# Patient Record
Sex: Male | Born: 1935 | Race: White | Hispanic: No | Marital: Married | Smoking: Former smoker
Health system: Southern US, Community
[De-identification: ages and names within clinical notes are randomized; demographics above are authoritative.]

## PROBLEM LIST (undated history)

## (undated) DIAGNOSIS — E785 Hyperlipidemia, unspecified: Secondary | ICD-10-CM

## (undated) DIAGNOSIS — C801 Malignant (primary) neoplasm, unspecified: Secondary | ICD-10-CM

## (undated) DIAGNOSIS — I10 Essential (primary) hypertension: Secondary | ICD-10-CM

## (undated) DIAGNOSIS — I2510 Atherosclerotic heart disease of native coronary artery without angina pectoris: Secondary | ICD-10-CM

## (undated) DIAGNOSIS — E119 Type 2 diabetes mellitus without complications: Secondary | ICD-10-CM

## (undated) HISTORY — PX: CORONARY ARTERY BYPASS GRAFT: SHX141

## (undated) HISTORY — DX: Hyperlipidemia, unspecified: E78.5

## (undated) HISTORY — PX: BILIARY DRAINAGE: SHX1229

## (undated) HISTORY — PX: APPENDECTOMY: SHX54

## (undated) HISTORY — PX: HERNIA REPAIR: SHX51

## (undated) HISTORY — DX: Type 2 diabetes mellitus without complications: E11.9

---

## 2000-07-02 ENCOUNTER — Inpatient Hospital Stay (HOSPITAL_COMMUNITY)
Admission: AD | Admit: 2000-07-02 | Discharge: 2000-07-07 | Payer: Self-pay | Admitting: Thoracic Surgery (Cardiothoracic Vascular Surgery)

## 2000-07-02 ENCOUNTER — Encounter: Payer: Self-pay | Admitting: Thoracic Surgery (Cardiothoracic Vascular Surgery)

## 2000-07-03 ENCOUNTER — Encounter: Payer: Self-pay | Admitting: Thoracic Surgery (Cardiothoracic Vascular Surgery)

## 2000-07-04 ENCOUNTER — Encounter: Payer: Self-pay | Admitting: Thoracic Surgery (Cardiothoracic Vascular Surgery)

## 2000-07-05 ENCOUNTER — Encounter: Payer: Self-pay | Admitting: Cardiothoracic Surgery

## 2005-10-06 HISTORY — PX: WHIPPLE PROCEDURE: SHX2667

## 2005-11-23 ENCOUNTER — Emergency Department: Payer: Self-pay | Admitting: Emergency Medicine

## 2005-12-04 ENCOUNTER — Ambulatory Visit: Payer: Self-pay | Admitting: Unknown Physician Specialty

## 2005-12-05 ENCOUNTER — Inpatient Hospital Stay: Payer: Self-pay | Admitting: Gastroenterology

## 2005-12-07 ENCOUNTER — Other Ambulatory Visit: Payer: Self-pay

## 2007-02-22 ENCOUNTER — Ambulatory Visit: Payer: Self-pay | Admitting: Ophthalmology

## 2007-04-12 ENCOUNTER — Ambulatory Visit: Payer: Self-pay | Admitting: Ophthalmology

## 2008-03-21 ENCOUNTER — Ambulatory Visit: Payer: Self-pay | Admitting: Urology

## 2008-03-21 ENCOUNTER — Other Ambulatory Visit: Payer: Self-pay

## 2008-03-27 ENCOUNTER — Ambulatory Visit: Payer: Self-pay | Admitting: Urology

## 2009-01-23 ENCOUNTER — Ambulatory Visit: Payer: Self-pay

## 2013-10-31 ENCOUNTER — Ambulatory Visit: Payer: Self-pay | Admitting: Family Medicine

## 2014-01-16 ENCOUNTER — Inpatient Hospital Stay: Payer: Self-pay | Admitting: Family Medicine

## 2014-01-16 LAB — CBC WITH DIFFERENTIAL/PLATELET
Basophil #: 0 10*3/uL (ref 0.0–0.1)
Basophil %: 0.2 %
Eosinophil #: 0.2 10*3/uL (ref 0.0–0.7)
Eosinophil %: 1.9 %
HCT: 43.5 % (ref 40.0–52.0)
HGB: 14.1 g/dL (ref 13.0–18.0)
Lymphocyte #: 2.5 10*3/uL (ref 1.0–3.6)
Lymphocyte %: 21.2 %
MCH: 27.2 pg (ref 26.0–34.0)
MCHC: 32.3 g/dL (ref 32.0–36.0)
MCV: 84 fL (ref 80–100)
Monocyte #: 0.6 x10 3/mm (ref 0.2–1.0)
Monocyte %: 4.8 %
Neutrophil #: 8.4 10*3/uL — ABNORMAL HIGH (ref 1.4–6.5)
Neutrophil %: 71.9 %
Platelet: 255 10*3/uL (ref 150–440)
RBC: 5.16 10*6/uL (ref 4.40–5.90)
RDW: 14 % (ref 11.5–14.5)
WBC: 11.7 10*3/uL — ABNORMAL HIGH (ref 3.8–10.6)

## 2014-01-16 LAB — URINALYSIS, COMPLETE
Bacteria: NONE SEEN
Bilirubin,UR: NEGATIVE
Blood: NEGATIVE
Glucose,UR: NEGATIVE mg/dL (ref 0–75)
Ketone: NEGATIVE
Leukocyte Esterase: NEGATIVE
Nitrite: NEGATIVE
Ph: 5 (ref 4.5–8.0)
Protein: NEGATIVE
RBC,UR: <1 /HPF (ref 0–5)
Specific Gravity: 1.009 (ref 1.003–1.030)
Squamous Epithelial: NONE SEEN
WBC UR: <1 /HPF (ref 0–5)

## 2014-01-16 LAB — COMPREHENSIVE METABOLIC PANEL
Albumin: 4 g/dL (ref 3.4–5.0)
Alkaline Phosphatase: 88 U/L
Anion Gap: 7 (ref 7–16)
BUN: 17 mg/dL (ref 7–18)
Bilirubin,Total: 0.5 mg/dL (ref 0.2–1.0)
Calcium, Total: 9.1 mg/dL (ref 8.5–10.1)
Chloride: 102 mmol/L (ref 98–107)
Co2: 30 mmol/L (ref 21–32)
Creatinine: 1.04 mg/dL (ref 0.60–1.30)
EGFR (African American): >60
EGFR (Non-African Amer.): >60
Glucose: 205 mg/dL — ABNORMAL HIGH (ref 65–99)
Osmolality: 285 (ref 275–301)
Potassium: 4.1 mmol/L (ref 3.5–5.1)
SGOT(AST): 151 U/L — ABNORMAL HIGH (ref 15–37)
SGPT (ALT): 100 U/L — ABNORMAL HIGH (ref 12–78)
Sodium: 139 mmol/L (ref 136–145)
Total Protein: 7.4 g/dL (ref 6.4–8.2)

## 2014-01-16 LAB — LIPASE, BLOOD: Lipase: 1031 U/L — ABNORMAL HIGH (ref 73–393)

## 2014-01-16 LAB — TROPONIN I
Troponin-I: <0.02 ng/mL
Troponin-I: <0.02 ng/mL
Troponin-I: <0.02 ng/mL

## 2014-01-17 LAB — LIPID PANEL
Cholesterol: 66 mg/dL (ref 0–200)
HDL: 36 mg/dL — AB (ref 40–60)
LDL CHOLESTEROL, CALC: 20 mg/dL (ref 0–100)
Triglycerides: 50 mg/dL (ref 0–200)
VLDL Cholesterol, Calc: 10 mg/dL (ref 5–40)

## 2014-01-17 LAB — LIPASE, BLOOD: LIPASE: 31 U/L — AB (ref 73–393)

## 2014-01-17 LAB — CBC WITH DIFFERENTIAL/PLATELET
Basophil #: 0 10*3/uL (ref 0.0–0.1)
Basophil %: 0.1 %
EOS PCT: 1.3 %
Eosinophil #: 0.1 10*3/uL (ref 0.0–0.7)
HCT: 38 % — ABNORMAL LOW (ref 40.0–52.0)
HGB: 12.7 g/dL — ABNORMAL LOW (ref 13.0–18.0)
Lymphocyte #: 0.5 10*3/uL — ABNORMAL LOW (ref 1.0–3.6)
Lymphocyte %: 5.1 %
MCH: 28 pg (ref 26.0–34.0)
MCHC: 33.3 g/dL (ref 32.0–36.0)
MCV: 84 fL (ref 80–100)
Monocyte #: 0.5 x10 3/mm (ref 0.2–1.0)
Monocyte %: 6.1 %
NEUTROS ABS: 7.8 10*3/uL — AB (ref 1.4–6.5)
Neutrophil %: 87.4 %
PLATELETS: 123 10*3/uL — AB (ref 150–440)
RBC: 4.52 10*6/uL (ref 4.40–5.90)
RDW: 14.2 % (ref 11.5–14.5)
WBC: 8.9 10*3/uL (ref 3.8–10.6)

## 2014-01-17 LAB — BASIC METABOLIC PANEL
Anion Gap: 6 — ABNORMAL LOW (ref 7–16)
BUN: 16 mg/dL (ref 7–18)
CREATININE: 1.1 mg/dL (ref 0.60–1.30)
Calcium, Total: 8 mg/dL — ABNORMAL LOW (ref 8.5–10.1)
Chloride: 107 mmol/L (ref 98–107)
Co2: 27 mmol/L (ref 21–32)
EGFR (African American): >60
EGFR (Non-African Amer.): >60
Glucose: 125 mg/dL — ABNORMAL HIGH (ref 65–99)
OSMOLALITY: 282 (ref 275–301)
Potassium: 3.8 mmol/L (ref 3.5–5.1)
Sodium: 140 mmol/L (ref 136–145)

## 2014-01-17 LAB — MAGNESIUM: MAGNESIUM: 1.5 mg/dL — AB

## 2014-01-18 LAB — CANCER ANTIGEN 19-9: CA 19-9: 53 U/mL — ABNORMAL HIGH (ref 0–35)

## 2014-01-18 LAB — CEA: CEA: 1.7 ng/mL (ref 0.0–4.7)

## 2014-05-26 LAB — CBC AND DIFFERENTIAL
HEMATOCRIT: 41 % (ref 41–53)
HEMOGLOBIN: 14.1 g/dL (ref 13.5–17.5)
Neutrophils Absolute: 76 /uL
Platelets: 206 10*3/uL (ref 150–399)
WBC: 5.3 10*3/mL

## 2014-05-26 LAB — TSH: TSH: 0.82 u[IU]/mL (ref <=5.90)

## 2014-09-05 LAB — LIPID PANEL
CHOLESTEROL: 117 mg/dL (ref 0–200)
HDL: 44 mg/dL (ref 35–70)
LDL Cholesterol: 59 mg/dL
LDl/HDL Ratio: 1.3
TRIGLYCERIDES: 72 mg/dL (ref 40–160)

## 2014-09-05 LAB — BASIC METABOLIC PANEL
BUN: 14 mg/dL (ref 4–21)
CREATININE: 1 mg/dL (ref 0.6–1.3)
GLUCOSE: 108 mg/dL
POTASSIUM: 4.7 mmol/L (ref 3.4–5.3)
Sodium: 141 mmol/L (ref 137–147)

## 2014-11-01 LAB — HEMOGLOBIN A1C: Hgb A1c MFr Bld: 8.5 % — AB (ref 4.0–6.0)

## 2015-01-27 NOTE — Consult Note (Signed)
Brief Consult Note: Diagnosis: Pancreatitis.   Patient was seen by consultant.   Comments: Mr. Deakins is a very pleasant 81 y/o caucasian male with hx ampullary adenocarcinoma s/p Whipple in 2007 previously followed by Dr. Dossie Der at [facility].  He has done well since his surgery until last night at midnight when he developed severe upper abdominal pain.  Lipase elevated 1031, WBC elevated 11.7 consistent with acute pancreatitis despite no significant changes of pancreas on CT.  There is a tubular structure noted on films at pancreatic head.  Will review with Dr Allen Norris.  AST 151 & ALT 100.  He did drink several mixed drinks 2 nights prior to the onset & drinks a couple ounces of bourbon nightly.    Plan: 1) Will review CT with Dr Allen Norris 2) Pt advised to avoid ETOH 3) May advance to clear liquids as tolerated 4) Stop pepcid, start protonix BID 5) Continue supportive measures 6) CA 19-9, CEA in AM Thanks for allowing [place] to participate in his care.  Please see full dictated note. #[id number].  Electronic Signatures: Andria Meuse (NP)  (Signed 13-Apr-15 22:16)  Authored: Brief Consult Note   Last Updated: 13-Apr-15 22:16 by Andria Meuse (NP)

## 2015-01-27 NOTE — Consult Note (Signed)
Chief Complaint:  Subjective/Chief Complaint Pt denies any abdominal pain, nausea or vomiting.  States "I feel great"!   VITAL SIGNS/ANCILLARY NOTES: **Vital Signs.:   14-Apr-15 06:20  Vital Signs Type Routine  Temperature Temperature (F) 98.8  Celsius 37.1  Temperature Source oral  Pulse Pulse 83  Respirations Respirations 18  Systolic BP Systolic BP 709  Diastolic BP (mmHg) Diastolic BP (mmHg) 74  Mean BP 92  Pulse Ox % Pulse Ox % 93  Pulse Ox Activity Level  At rest  Oxygen Delivery 2L   Brief Assessment:  GEN well developed, well nourished, no acute distress, A/oX3   Cardiac Regular   Respiratory normal resp effort   Gastrointestinal Normal   Gastrointestinal details normal Soft  Nontender  Nondistended  Bowel sounds normal  No rebound tenderness  No gaurding  No rigidity   EXTR negative cyanosis/clubbing, negative edema   Additional Physical Exam Skin: pink, warm, dry   Lab Results: Routine Chem:  14-Apr-15 04:13   Lipase  31 (Result(s) reported on 17 Jan 2014 at 04:54AM.)  Cholesterol, Serum 66  Triglycerides, Serum 50  HDL (INHOUSE)  36  VLDL Cholesterol Calculated 10  LDL Cholesterol Calculated 20 (Result(s) reported on 17 Jan 2014 at 04:54AM.)  Glucose, Serum  125  BUN 16  Creatinine (comp) 1.10  Sodium, Serum 140  Potassium, Serum 3.8  CO2, Serum 27  Calcium (Total), Serum  8.0  Anion Gap  6  Osmolality (calc) 282  eGFR (African American) >60  eGFR (Non-African American) >60 (eGFR values <3m/min/1.73 m2 may be an indication of chronic kidney disease (CKD). Calculated eGFR is useful in patients with stable renal function. The eGFR calculation will not be reliable in acutely ill patients when serum creatinine is changing rapidly. It is not useful in  patients on dialysis. The eGFR calculation may not be applicable to patients at the low and high extremes of body sizes, pregnant women, and vegetarians.)  Magnesium, Serum  1.5  (1.8-2.4 THERAPEUTIC RANGE: 4-7 mg/dL TOXIC: > 10 mg/dL  -----------------------)  Routine Hem:  14-Apr-15 04:13   WBC (CBC) 8.9  RBC (CBC) 4.52  Hemoglobin (CBC)  12.7  Hematocrit (CBC)  38.0  Platelet Count (CBC)  123  MCV 84  MCH 28.0  MCHC 33.3  RDW 14.2  Neutrophil % 87.4  Lymphocyte % 5.1  Monocyte % 6.1  Eosinophil % 1.3  Basophil % 0.1  Neutrophil #  7.8  Lymphocyte #  0.5  Monocyte # 0.5  Eosinophil # 0.1  Basophil # 0.0 (Result(s) reported on 17 Jan 2014 at 09:04AM.)   Assessment/Plan:  Assessment/Plan:  Assessment Acute pancreatitis:  Clinically pt is much improved History of ampullary adenocarcinoma status post Whipple in 2007:  No evidence of recurrence on CT.  Tumor markers pending.   Plan 1) He is advised to avoid alcohol.  2) advance diet as tolerated.  3) continue Protonix 40 mg daily 4) follow up CA 19-9, CEA Please call if you have any questions or concerns   Electronic Signatures: JAndria Meuse(NP)  (Signed 14-Apr-15 10:01)  Authored: Chief Complaint, VITAL SIGNS/ANCILLARY NOTES, Brief Assessment, Lab Results, Assessment/Plan   Last Updated: 14-Apr-15 10:01 by JAndria Meuse(NP)

## 2015-01-27 NOTE — Discharge Summary (Signed)
PATIENT NAME:  Anthony Craig, Anthony Craig MR#:  [id number] DATE OF BIRTH:  [birth year]  DATE OF ADMISSION:  01/16/2014  DATE OF DISCHARGE:  01/17/2014  REASON FOR ADMISSION: Abdominal pain, nausea, vomiting diagnosed with pancreatitis.   DISCHARGE DIAGNOSES:  1.  Acute pancreatitis.  2.  History of ampullary adenocarcinoma, status post Whipple surgery done by Dr. Dossie Der at [place] [facility].  3.  Type 2 diabetes.  4.  History of prostate cancer, status post prostatectomy.  5.  Hypertension.  6.  Hyperlipidemia.   LABORATORY DATA:  Glucose 205 now 125, creatinine 0.04, magnesium 1.5 replaced, Lipase 1000, at discharge 31, ALT 100, AST 151. Troponin is negative. White count 11.7 admission and discharge 8.9, hemoglobin 12.7, platelet count 123,000 from dropped from 255,000. Hemoglobin dropped from 14 to 12.7 and this is due to hemodilution. Urinalysis: No signs of urinary tract infection.   CT scan of the abdomen and pelvis show prior Whipple's procedure with postsurgical changes noted. Examination was limited. Two cystic structure arising from the region of the pancreatic head causing coursing inferiorly and then extending superiorly towards gastrojejunostomy site represent decompressed fluid filled small bowel,  positive atrophic pancreas, diverticulosis without evidence of diverticulitis, left inguinal hernia.   HOSPITAL COURSE: This is a very nice 81 year old gentleman with history of previous pancreatic cancer, ampullary cancer, came to the Emergency Department after developing acute abdominal pain at midnight, the night of the previous admission, 10/10 on intensity, waxing and waning for several hours. The patient in the Emergency Department had a slight increase AST at 151 and ALT at 100 and CT scan that showed no significant acute problems.   His Lipase was slightly elevated 1000 and after fluids it came back to normal. The patient was advised not to drink alcohol as he drinks almost  every day a glass of wine. He was started on clear liquid diet after being n.p.o. and his diet was advanced as tolerated. This morning, the patient has no significant pain. He was asked to continue PPI and he was given IV fluids to maintain hydration. The patient now is stable in really good condition, wanted to go home.   Patient is going to be discharged with the following medications: Levemir 50 units once bedtime,  pravastatin 40 mg once a day, omeprazole 20 mg twice daily, lisinopril 10 mg twice daily, vitamin D once daily.   FOLLOW UP:  With Dr. Dossie Der at [place] [facility].  Dr. Dossie Der please follow up on C19-9 sent for the patient. Reports are not back.   TIME SPENT: I spent about 40 minutes discharging this patient today.    ____________________________ Grafton Sink, MD rsg:tc D: 01/17/2014 13:19:06 ET T: 01/17/2014 13:35:17 ET JOB#: [id number]  cc: Glouster Sink, MD, <Dictator>             Shelbie Proctor. Dossie Der, MD Roselie Awkward America Brown MD ELECTRONICALLY SIGNED 01/22/2014 5:19

## 2015-01-27 NOTE — Consult Note (Signed)
PATIENT NAME:  Anthony Craig, Anthony Craig MR#:  [id number] DATE OF BIRTH:  [birth year]  DATE OF CONSULTATION:  01/16/2014  REFERRING PHYSICIAN:  Dr. Waldron Labs.  CONSULTING PHYSICIAN:  Andria Meuse, NP and Lucilla Lame, M.D.  PRIMARY GASTROENTEROLOGIST: Dr. Gustavo Lah.   PRIMARY CARE PHYSICIAN:  Dr. Rosanna Randy.  ONCOLOGIST:  Dr. Dossie Der at [facility].   HISTORY OF PRESENT ILLNESS: Anthony Craig is an 81 year old Caucasian male who was in his usual state of health, which is very active, who developed acute abdominal pain at midnight last night. It awakened him from sleeping. The pain was 10 out of 10 at worst. He awakened, went to the kitchen and got some water. As soon as he drank the water, the pain was very severe. It lasted for about 45 seconds and then he would have lesser pain, which he rated on a scale of 5 out of 10, which waxed and waned for several hours. He is status post Whipple procedure for ampullary adenocarcinoma in 2007. He was followed by Dr. Dossie Der at [facility], but was released from his care 2 years ago since he was doing so well. CT scan of abdomen and pelvis shows status post Whipple and a tubular cystic structure of the pancreatic head to the gastrojejunostomy site and atrophic pancreas. His lipase was 1031. His white blood cell count was 11.7. He has been started on Pepcid and heparin. He denies any new medications. He was also found to have diverticulosis without diverticulitis and a left inguinal hernia on CT scan. He consumes alcohol by way of 2 ounces of bourbon nightly. He did have several drinks 2 nights ago at a party with 6 or more ounces of bourbon that night. He has been started on omeprazole 20 mg b.i.d.  He denies any weight loss in the last year. He denies any anorexia. His LFTs show an AST of 151 and ALT of 100.   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 He is  followed by Dr. Dossie Der at [facility], and status post Whipple. He had a radical prostatectomy for prostate cancer in 1994, diabetes mellitus, GERD, hypertension, hyperlipidemia and abnormal thyroid tests, currently undergoing workup under the direction of Dr. Rosanna Randy, circumcision in 2003.     MEDICATIONS PRIOR TO ADMISSION: Lisinopril 10 mg b.i.d., Levemir 15 units subcutaneously daily, pravastatin 40 mg at bedtime, vitamin D3 oral daily.   ALLERGIES: No known drug allergies.   FAMILY HISTORY: There is no known family history of colon carcinoma, liver or chronic GI problems or pancreas problems.   SOCIAL HISTORY: He denies tobacco or illicit drug use. Alcohol use as above. He lives with his wife, and he is retired from [employer] as a Freight forwarder.   REVIEW OF SYSTEMS:  See HPI, otherwise negative review of systems.  It is notable that he is very active and goes to the gym several times each week.   PHYSICAL EXAMINATION: VITAL SIGNS: Temp 98.6, pulse 108, respirations 16. Blood pressure is 94/61. O2 sats are 92% on 2 liters via nasal cannula.  GENERAL: He is a well-developed, well-nourished, elderly Caucasian male who appears younger than his stated age.  HEENT: Sclerae clear, anicteric. Conjunctivae pink. Oropharynx pink and moist without any lesions.  NECK: Supple without mass or thyromegaly.  CHEST: Heart regular rate and rhythm. Normal S1, S2 without murmurs, clicks, rubs or gallops.  LUNGS: Clear to auscultation bilaterally.  ABDOMEN: Positive  bowel sounds x 4. No bruits auscultated. Abdomen is soft, mildly distended, nontender without palpable mass or hepatosplenomegaly. No rebound tenderness or guarding.  EXTREMITIES: Without clubbing or edema.  SKIN: Pink, warm and dry without any rash or jaundice.  NEUROLOGIC: Grossly intact.  MUSCULOSKELETAL: Good equal movement and strength bilaterally.  PSYCHIATRIC: Alert, cooperative. Normal mood and affect.    LABORATORY STUDIES: Glucose 205, otherwise normal met-7. Troponins negative x 2 and urinalysis negative.   IMPRESSION: Anthony Craig is a very pleasant 81 year old Caucasian male with history of ampullary adenocarcinoma status post Whipple in 2007, previously followed by Dr. Dossie Der at [facility]. He has done well since his surgery until last night at midnight when he developed severe upper abdominal pain. Lipase is elevated at 1031 and white blood cell count elevated at 11.7, consistent with acute pancreatitis despite no significant changes of pancreas on CT other than atrophy. There is a tubular structure noted on film of the pancreatic head, which I did review with Dr. Allen Norris. Unable to differentiate any further at this point. No evidence of recurrent pancreatic carcinoma or adenopathy. AST 151 and ALT 100. He did drink several mixed drinks 2 nights prior to the onset of this episode of pancreatitis, and he does drink a couple of ounces of bourbon nightly, which could be contributing to his pancreatitis.   PLAN: 1.  He is advised to avoid alcohol.  2.  He may advance to clear liquids as tolerated.  3.  Stop Pepcid and start Protonix 40 mg b.i.d.  4.  Continue supportive measures.  5.  CA 19-9, CEA in a.m.   Thank you for allowing [place] to participate in the care of  Anthony Craig.    ____________________________ Andria Meuse, NP klj:dmm D: 01/16/2014 22:16:40 ET T: 01/16/2014 22:35:02 ET JOB#: [id number]  cc: Andria Meuse, NP, <Dictator> Richard L. Rosanna Randy, MD Lollie Sails, MD Shelbie Proctor. Dossie Der, MD Andria Meuse FNP ELECTRONICALLY SIGNED 01/24/2014 16:40

## 2015-01-27 NOTE — H&P (Signed)
PATIENT NAME:  Anthony Craig, Anthony Craig MR#:  [id number] DATE OF BIRTH:  August 16, 1928  DATE OF ADMISSION:  01/16/2014  REFERRING PHYSICIAN: Dr. Marjean Donna.   PRIMARY CARE PHYSICIAN: Dr. Rosanna Randy.   CHIEF COMPLAINT: Abdominal pain, nausea and vomiting.   HISTORY OF PRESENT ILLNESS: This is an 81 year old male who is pretty active, who goes to the gym three times every week, who presents with complaints of abdominal pain, nausea and vomiting. The patient reports symptoms 3 hours prior to presentation to Emergency Department, described it as epigastric pain, it was accompanied by some nausea and vomiting. Denies any fever, any chills, any diarrhea, any sweating, any chest pain, any melena or coffee-ground emesis. The patient is known to have a history of pancreatic cancer in the past, status post Whipple surgery in 2007. Since then, has been in remission. The patient was found to have elevated lipase level at 1031, as well as mildly elevated LFTs with ALT at 100 and AST at 151, mild leukocytosis at 11.7, but urinalysis was negative, and the patient was afebrile. The patient had significant improvement of his pain after receiving IV Dilaudid. The patient had CT abdomen and pelvis with IV contrast, could not take p.o. contrast due to his significant nausea and vomiting, which did show prior Whipple procedure and tubular cystic structure arising from the region of the pancreatic head, extending towards the gastrojejunostomy site, which may represent decompressed fluid-filled small bowel, but due to the lack of contrast, it was not for sure. As well, the patient was found to have left inguinal hernia without any evidence of bowel dilatation. Hospitalist service was requested to admit the patient for further treatment of his acute pancreatitis.   PAST MEDICAL HISTORY: 1.  Pancreatic cancer.  2.  Diabetes.  3.  Gastroesophageal reflux disease.  4.  Prostate cancer.  5.  Hypertension.  6.  Hyperlipidemia.    PAST  SURGICAL HISTORY:  1.  Radical prostatectomy in 1994.  2.  Circumcision in 2003.  3.  Whipple procedure in 2007.    SOCIAL HISTORY: The patient is nonsmoker, nondrinker, lives at home with his wife. He is very active, goes to the gym 3 times every week.   FAMILY HISTORY: No significant family history of coronary artery disease or diabetes mellitus.   ALLERGIES: No known drug allergies.   HOME MEDICATIONS: 1.  Lisinopril 10 mg oral 2 times a day.  2.  Levemir 15 units subcu daily.  3.  Pravastatin 40 mg at bedtime.  4.  Omeprazole 20 mg oral 2 times a day.  5.  Vitamin D3 orally daily.    REVIEW OF SYSTEMS: CONSTITUTIONAL:  Denies fever, chills, weakness, weight gain, weight loss.  EYES: Denies blurry vision, double vision, inflammation, glaucoma.  ENT: Denies tinnitus, ear pain, hearing loss, epistaxis or discharge.  RESPIRATORY: Denies any cough, wheezing, hemoptysis, dyspnea.  CARDIOVASCULAR: Denies chest pain, orthopnea, edema, palpitations.  GASTROINTESTINAL: Reports nausea, vomiting, abdominal pain. Denies diarrhea, hematemesis, melena, jaundice.  GENITOURINARY: Denies dysuria, hematuria,  ENDOCRINE: Denies polyuria, polydipsia, heat or cold intolerance.  HEMATOLOGY: Denies anemia, easy bruising, bleeding diathesis.  INTEGUMENTARY: Denies acne, rash or skin lesion.  MUSCULOSKELETAL: Denies any cramps, swelling, gout.  NEUROLOGIC: Denies CVA, transient ischemic attack, headache, vertigo, tremor.  PSYCHIATRIC: Denies anxiety, insomnia or depression.   PHYSICAL EXAMINATION: VITAL SIGNS: Temperature 97.7, pulse 116, respiratory rate 18, blood pressure 99/50, saturating 93% on oxygen.  GENERAL: Elderly male, looks comfortable, in no apparent distress.  HEENT: Head atraumatic, normocephalic.  Pupils equal, reactive to light. Pale conjunctivae. Anicteric sclerae. Dry oral mucosa.  NECK: Supple. No thyromegaly. No JVD.  CHEST: Good air entry bilaterally. No wheezing, rales, rhonchi.   CARDIOVASCULAR: S1, S2 heard. No rubs, murmurs, gallops. Tachycardic.  ABDOMEN: Soft, nontender, nondistended. Bowel sounds present. Has surgical scar from his previous surgeries. The patient has left inguinal hernia. No tenderness to palpation.  EXTREMITIES: No edema. No clubbing. No cyanosis. Pedal and radial pulses +2 bilaterally.  PSYCHIATRIC: Appropriate affect. Awake, alert x3. Intact judgment and insight.  NEUROLOGIC: Cranial nerves grossly intact. Motor 5/5. No focal deficits.  SKIN: Good skin turgor, dry.  LYMPHATIC: No cervical lymphadenopathy.  MUSCULOSKELETAL: No joint effusion or erythema.   PERTINENT LABORATORY DATA: Glucose 205, BUN 17, creatinine 1.04, sodium 139, potassium 4.1, chloride 102, CO2 30, lipase 1031, ALT 100, AST 151, alkaline phosphatase 88. Troponin less than 0.02. White blood cells 11.7, hemoglobin 14.1, hematocrit 43.5, platelets 255. Urinalysis negative for leukocyte esterase and nitrite.   IMAGING STUDIES: CT abdomen and pelvis with IV contrast: Prior Whipple procedure and tubular cystic structure arising from the pancreas, possibly decompressed small bowel, atrophic pancreas, diverticulosis and left inguinal hernia containing sigmoid colon without bowel dilatation.   ASSESSMENT AND PLAN: 1.  Acute pancreatitis. The patient presents with nausea, vomiting, abdominal pain and elevated lipase level. Unclear why he is having acute pancreatitis. We will check lipid panel. Will keep him on nothing by mouth. We will continue with aggressive IV fluid resuscitation. The patient will receive a total of 3 liters of IV normal saline as a fluid bolus in the Emergency Department, then will keep on 100 mL per hour. Will keep him on as-needed nausea and pain medicine. He will be kept on nothing by mouth. We will consult gastroenterology service.  2.  Diabetes mellitus. As the patient is on nothing by mouth, we will hold his insulin. We will check fingerstick blood glucose and, if  elevated, we will start him on sliding scale.  3.  Hypertension. Blood pressure is on the lower side, so we will hold all medications.  4.  Hyperlipidemia. Will hold statin until patient is more stable.  5.  Gastroesophageal reflux disease. We will continue the patient on proton pump inhibitor.  6.  Deep vein thrombosis prophylaxis. Subcutaneous heparin.   CODE STATUS: The patient reports he is a full code and he has a living will.   TOTAL TIME SPENT ON ADMISSION AND PATIENT CARE: 50 minutes.     ____________________________ Albertine Patricia, MD dse:cg D: 01/16/2014 05:46:15 ET T: 01/16/2014 06:06:24 ET JOB#: [id number]  cc: Albertine Patricia, MD, <Dictator> Shahd Occhipinti Graciela Husbands MD ELECTRONICALLY SIGNED 01/17/2014 3:12

## 2015-02-08 DIAGNOSIS — G250 Essential tremor: Secondary | ICD-10-CM | POA: Insufficient documentation

## 2015-02-08 DIAGNOSIS — K859 Acute pancreatitis without necrosis or infection, unspecified: Secondary | ICD-10-CM | POA: Insufficient documentation

## 2015-02-08 DIAGNOSIS — C259 Malignant neoplasm of pancreas, unspecified: Secondary | ICD-10-CM | POA: Insufficient documentation

## 2015-02-08 DIAGNOSIS — R0789 Other chest pain: Secondary | ICD-10-CM | POA: Insufficient documentation

## 2015-02-08 DIAGNOSIS — K219 Gastro-esophageal reflux disease without esophagitis: Secondary | ICD-10-CM | POA: Insufficient documentation

## 2015-02-08 DIAGNOSIS — E785 Hyperlipidemia, unspecified: Secondary | ICD-10-CM | POA: Insufficient documentation

## 2015-02-08 DIAGNOSIS — C61 Malignant neoplasm of prostate: Secondary | ICD-10-CM | POA: Insufficient documentation

## 2015-02-08 DIAGNOSIS — K861 Other chronic pancreatitis: Secondary | ICD-10-CM | POA: Insufficient documentation

## 2015-02-08 DIAGNOSIS — E119 Type 2 diabetes mellitus without complications: Secondary | ICD-10-CM | POA: Insufficient documentation

## 2015-02-08 DIAGNOSIS — I10 Essential (primary) hypertension: Secondary | ICD-10-CM | POA: Insufficient documentation

## 2015-02-08 DIAGNOSIS — R42 Dizziness and giddiness: Secondary | ICD-10-CM | POA: Insufficient documentation

## 2015-02-08 DIAGNOSIS — Z794 Long term (current) use of insulin: Secondary | ICD-10-CM | POA: Insufficient documentation

## 2015-02-08 DIAGNOSIS — M5412 Radiculopathy, cervical region: Secondary | ICD-10-CM | POA: Insufficient documentation

## 2015-02-08 DIAGNOSIS — G4700 Insomnia, unspecified: Secondary | ICD-10-CM | POA: Insufficient documentation

## 2015-02-08 DIAGNOSIS — I25812 Atherosclerosis of bypass graft of coronary artery of transplanted heart without angina pectoris: Secondary | ICD-10-CM | POA: Insufficient documentation

## 2015-02-08 DIAGNOSIS — T7840XA Allergy, unspecified, initial encounter: Secondary | ICD-10-CM | POA: Insufficient documentation

## 2015-02-08 DIAGNOSIS — K4090 Unilateral inguinal hernia, without obstruction or gangrene, not specified as recurrent: Secondary | ICD-10-CM | POA: Insufficient documentation

## 2015-02-08 DIAGNOSIS — R031 Nonspecific low blood-pressure reading: Secondary | ICD-10-CM | POA: Insufficient documentation

## 2015-02-08 DIAGNOSIS — E1159 Type 2 diabetes mellitus with other circulatory complications: Secondary | ICD-10-CM | POA: Insufficient documentation

## 2015-02-08 DIAGNOSIS — R5383 Other fatigue: Secondary | ICD-10-CM | POA: Insufficient documentation

## 2015-02-08 DIAGNOSIS — E0590 Thyrotoxicosis, unspecified without thyrotoxic crisis or storm: Secondary | ICD-10-CM | POA: Insufficient documentation

## 2015-02-08 DIAGNOSIS — E538 Deficiency of other specified B group vitamins: Secondary | ICD-10-CM | POA: Insufficient documentation

## 2015-03-15 ENCOUNTER — Ambulatory Visit (INDEPENDENT_AMBULATORY_CARE_PROVIDER_SITE_OTHER): Payer: Commercial Managed Care - HMO | Admitting: Family Medicine

## 2015-03-15 ENCOUNTER — Encounter: Payer: Self-pay | Admitting: Family Medicine

## 2015-03-15 ENCOUNTER — Other Ambulatory Visit: Payer: Self-pay | Admitting: Family Medicine

## 2015-03-15 VITALS — BP 128/62 | HR 62 | Temp 97.8°F | Resp 14 | Ht 71.0 in | Wt 151.0 lb

## 2015-03-15 DIAGNOSIS — E785 Hyperlipidemia, unspecified: Secondary | ICD-10-CM | POA: Diagnosis not present

## 2015-03-15 DIAGNOSIS — I10 Essential (primary) hypertension: Secondary | ICD-10-CM

## 2015-03-15 DIAGNOSIS — E118 Type 2 diabetes mellitus with unspecified complications: Secondary | ICD-10-CM | POA: Diagnosis not present

## 2015-03-15 DIAGNOSIS — E0590 Thyrotoxicosis, unspecified without thyrotoxic crisis or storm: Secondary | ICD-10-CM | POA: Diagnosis not present

## 2015-03-15 NOTE — Progress Notes (Signed)
Anthony Craig  MRN: [id number] DOB: 09-23-1928  Subjective:  Diabetes He presents for his follow-up diabetic visit. He has type 2 diabetes mellitus. No MedicAlert identification noted. There are no hypoglycemic associated symptoms. Pertinent negatives for hypoglycemia include no headaches or sweats. Associated symptoms include fatigue (occassional). Pertinent negatives for diabetes include no blurred vision and no chest pain. There are no hypoglycemic complications. Risk factors for coronary artery disease include diabetes mellitus, dyslipidemia, hypertension and family history. Current diabetic treatment includes insulin injections. His breakfast blood glucose range is generally 90-110 mg/dl. (He sometimes has readings in the 80 s but is asymptomatic)  Hypertension This is a chronic problem. The problem is unchanged. The problem is controlled. Pertinent negatives include no anxiety, blurred vision, chest pain, headaches, malaise/fatigue, neck pain, orthopnea, palpitations, peripheral edema, PND, shortness of breath or sweats. Risk factors for coronary artery disease include diabetes mellitus, dyslipidemia and family history.   Pancreatic Cancer S/p Whipple procedure about 8 years ago. CAD/s/p CABG  Patient Active Problem List   Diagnosis Date Noted  . Allergic drug reaction 02/08/2015  . 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 B12 deficiency 02/08/2015  . 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 Chest discomfort 02/08/2015  . Diabetes 02/08/2015  . Dizziness 02/08/2015  . Benign essential tremor 02/08/2015  . Fatigue 02/08/2015  . Acid reflux 02/08/2015  . HLD (hyperlipidemia) 02/08/2015  . BP (high blood pressure) 02/08/2015  . Hyperthyroidism 02/08/2015  . Arterial blood pressure decreased 02/08/2015  . Hernia, inguinal, left 02/08/2015  . Cannot sleep 02/08/2015  . Cancer of pancreas 02/08/2015  . Pancreatitis 02/08/2015  . CA  of prostate 02/08/2015    History reviewed. No pertinent past medical history.  History   Social History  . Marital Status: Married    Spouse Name: N/A  . Number of Children: N/A  . Years of Education: N/A   Occupational History  . Not on file.   Social History Main Topics  . Smoking status: Former Smoker -- 0.50 packs/day for 30 years    Quit date: 10/06/1979  . Smokeless tobacco: Not on file  . Alcohol Use: 1.8 oz/week    3 Cans of beer per week     Comment: Previously heavy drinker  . Drug Use: Not on file  . Sexual Activity: Not on file   Other Topics Concern  . Not on file   Social History Narrative  . No narrative on file    Outpatient Prescriptions Prior to Visit  Medication Sig Dispense Refill  . Alcohol Swabs (B-D SINGLE USE SWABS REGULAR) PADS USE AS DIRECTED  WITH  EACH  FINGERSTICK 300 each 3  . aspirin 81 MG tablet Take 1 tablet by mouth daily.    Marland Kitchen atorvastatin (LIPITOR) 40 MG tablet Take 1 tablet by mouth at bedtime.    . Coenzyme Q10 (CO Q 10) 100 MG CAPS Take 1 tablet by mouth daily.    . cyanocobalamin 1000 MCG tablet Take 1 tablet by mouth daily.    . diphenhydrAMINE (BENADRYL) 25 mg capsule Take 1 capsule by mouth at bedtime as needed.    . diphenoxylate-atropine (LOMOTIL) 2.5-0.025 MG per tablet Take 1 tablet by mouth 4 (four) times daily as needed.    Marland Kitchen glucose blood test strip Apply 1 strip topically 3 (three) times daily as needed.    . Insulin Detemir (LEVEMIR) 100 UNIT/ML Pen Inject 60 Units into the skin at bedtime.    Marland Kitchen  Multiple Vitamins-Minerals (MULTIVITAL-M) TABS Take 1 tablet by mouth daily.    Marland Kitchen omeprazole (PRILOSEC) 20 MG capsule Take 1 capsule by mouth 2 (two) times daily.    . Pancrelipase, Lip-Prot-Amyl, 10000 UNITS CPEP Take 3 capsules by mouth 3 (three) times daily with meals. After eating    . Simethicone 125 MG CAPS Take 2 capsules by mouth daily.    Marland Kitchen zolpidem (AMBIEN) 5 MG tablet Take 1 tablet by mouth at bedtime as needed.      No facility-administered medications prior to visit.    Allergies  Allergen Reactions  . Doxycycline   . Hydrocodone-Homatropine     Review of Systems  Constitutional: Positive for fatigue (occassional). Negative for malaise/fatigue.  Eyes: Negative for blurred vision.  Respiratory: Negative for shortness of breath.   Cardiovascular: Negative for chest pain, palpitations, orthopnea and PND.  Gastrointestinal: Negative.   Genitourinary: Negative.   Musculoskeletal: Negative for neck pain.  Neurological: Negative.  Negative for headaches.  Endo/Heme/Allergies: Negative.    Objective:  BP 128/62 mmHg  Pulse 62  Temp(Src) 97.8 F (36.6 C) (Oral)  Resp 14  Ht 5' 11" (1.803 m)  Wt 151 lb (68.493 kg)  BMI 21.07 kg/m2  Physical Exam  Constitutional: He is oriented to person, place, and time and well-developed, well-nourished, and in no distress.  HENT:  Head: Normocephalic and atraumatic.  Right Ear: External ear normal.  Left Ear: External ear normal.  Nose: Nose normal.  Mouth/Throat: Oropharynx is clear and moist.  Eyes: Conjunctivae and EOM are normal. Pupils are equal, round, and reactive to light.  Neck: Normal range of motion. Neck supple.  Cardiovascular: Normal rate, regular rhythm, normal heart sounds and intact distal pulses.   Pulmonary/Chest: Effort normal and breath sounds normal.  Abdominal: Soft. Bowel sounds are normal.  Musculoskeletal: Normal range of motion.  Neurological: He is alert and oriented to person, place, and time.  Diabetic Foot Exam - Simple   Simple Foot Form  Diabetic Foot exam was performed with the following findings:  Yes  03/15/2015  3:53 PM  Visual Inspection  No deformities, no ulcerations, no other skin breakdown bilaterally:  Yes  Sensation Testing  Intact to touch and monofilament testing bilaterally:  Yes  Pulse Check  Posterior Tibialis and Dorsalis pulse intact bilaterally:  Yes  Comments      Skin: Skin is warm and  dry.  Psychiatric: Mood, memory, affect and judgment normal.    Assessment and Plan :  Essential hypertension  Type 2 diabetes mellitus with complication - Plan: HM Diabetes Foot Exam, CMP14+EGFR, HgB A1c  Hyperthyroidism - Plan: TSH + free T4  HLD (hyperlipidemia)  Pancreatic Cancer--8 ys post Whipple  Prostate Cancer-doing well.  Hernia repair--spring 2016. Miguel Aschoff MD [facility] 03/15/2015 4:02 PM

## 2015-03-16 LAB — HEMOGLOBIN A1C
Est. average glucose Bld gHb Est-mCnc: 177 mg/dL
HEMOGLOBIN A1C: 7.8 % — AB (ref 4.8–5.6)

## 2015-03-16 LAB — CMP14+EGFR
A/G RATIO: 2.1 (ref 1.1–2.5)
ALT: 27 IU/L (ref 0–44)
AST: 25 IU/L (ref 0–40)
Albumin: 4.7 g/dL (ref 3.5–4.7)
Alkaline Phosphatase: 74 IU/L (ref 39–117)
BUN / CREAT RATIO: 15 (ref 10–22)
BUN: 17 mg/dL (ref 8–27)
Bilirubin Total: 0.4 mg/dL (ref 0.0–1.2)
CHLORIDE: 101 mmol/L (ref 97–108)
CO2: 24 mmol/L (ref 18–29)
Calcium: 9.4 mg/dL (ref 8.6–10.2)
Creatinine, Ser: 1.1 mg/dL (ref 0.76–1.27)
GFR, EST AFRICAN AMERICAN: 71 mL/min/{1.73_m2} (ref >59)
GFR, EST NON AFRICAN AMERICAN: 61 mL/min/{1.73_m2} (ref >59)
Globulin, Total: 2.2 g/dL (ref 1.5–4.5)
Glucose: 139 mg/dL — ABNORMAL HIGH (ref 65–99)
POTASSIUM: 4.1 mmol/L (ref 3.5–5.2)
SODIUM: 140 mmol/L (ref 134–144)
TOTAL PROTEIN: 6.9 g/dL (ref 6.0–8.5)

## 2015-03-16 LAB — TSH+FREE T4
Free T4: 1.2 ng/dL (ref 0.82–1.77)
TSH: 0.711 u[IU]/mL (ref 0.450–4.500)

## 2015-03-20 ENCOUNTER — Telehealth: Payer: Self-pay

## 2015-03-20 NOTE — Telephone Encounter (Signed)
-----   Message from Jerrol Banana., MD sent at 03/18/2015  3:45 PM EDT ----- Labs stable. Please advise patient. DM a little better.

## 2015-03-20 NOTE — Telephone Encounter (Signed)
Advised  ED 

## 2015-04-27 ENCOUNTER — Other Ambulatory Visit: Payer: Self-pay

## 2015-04-27 ENCOUNTER — Encounter: Payer: Self-pay | Admitting: Emergency Medicine

## 2015-04-27 ENCOUNTER — Observation Stay
Admission: EM | Admit: 2015-04-27 | Discharge: 2015-04-28 | Disposition: A | Discharge status: Home / Self Care | Payer: Commercial Managed Care - HMO | Attending: Specialist | Admitting: Specialist

## 2015-04-27 ENCOUNTER — Emergency Department: Payer: Commercial Managed Care - HMO

## 2015-04-27 DIAGNOSIS — I2510 Atherosclerotic heart disease of native coronary artery without angina pectoris: Secondary | ICD-10-CM | POA: Diagnosis not present

## 2015-04-27 DIAGNOSIS — Z881 Allergy status to other antibiotic agents status: Secondary | ICD-10-CM | POA: Diagnosis not present

## 2015-04-27 DIAGNOSIS — G250 Essential tremor: Secondary | ICD-10-CM | POA: Insufficient documentation

## 2015-04-27 DIAGNOSIS — Z794 Long term (current) use of insulin: Secondary | ICD-10-CM | POA: Diagnosis not present

## 2015-04-27 DIAGNOSIS — Z79899 Other long term (current) drug therapy: Secondary | ICD-10-CM | POA: Insufficient documentation

## 2015-04-27 DIAGNOSIS — Z87891 Personal history of nicotine dependence: Secondary | ICD-10-CM | POA: Diagnosis not present

## 2015-04-27 DIAGNOSIS — K859 Acute pancreatitis, unspecified: Secondary | ICD-10-CM | POA: Diagnosis not present

## 2015-04-27 DIAGNOSIS — Z825 Family history of asthma and other chronic lower respiratory diseases: Secondary | ICD-10-CM | POA: Insufficient documentation

## 2015-04-27 DIAGNOSIS — R0789 Other chest pain: Secondary | ICD-10-CM | POA: Diagnosis not present

## 2015-04-27 DIAGNOSIS — Y9239 Other specified sports and athletic area as the place of occurrence of the external cause: Secondary | ICD-10-CM | POA: Diagnosis not present

## 2015-04-27 DIAGNOSIS — R42 Dizziness and giddiness: Secondary | ICD-10-CM | POA: Diagnosis not present

## 2015-04-27 DIAGNOSIS — M47814 Spondylosis without myelopathy or radiculopathy, thoracic region: Secondary | ICD-10-CM | POA: Diagnosis not present

## 2015-04-27 DIAGNOSIS — Z951 Presence of aortocoronary bypass graft: Secondary | ICD-10-CM | POA: Diagnosis not present

## 2015-04-27 DIAGNOSIS — Y9353 Activity, golf: Secondary | ICD-10-CM | POA: Diagnosis not present

## 2015-04-27 DIAGNOSIS — E118 Type 2 diabetes mellitus with unspecified complications: Secondary | ICD-10-CM

## 2015-04-27 DIAGNOSIS — K219 Gastro-esophageal reflux disease without esophagitis: Secondary | ICD-10-CM | POA: Diagnosis not present

## 2015-04-27 DIAGNOSIS — C61 Malignant neoplasm of prostate: Secondary | ICD-10-CM | POA: Diagnosis not present

## 2015-04-27 DIAGNOSIS — K4090 Unilateral inguinal hernia, without obstruction or gangrene, not specified as recurrent: Secondary | ICD-10-CM | POA: Diagnosis not present

## 2015-04-27 DIAGNOSIS — E0590 Thyrotoxicosis, unspecified without thyrotoxic crisis or storm: Secondary | ICD-10-CM | POA: Insufficient documentation

## 2015-04-27 DIAGNOSIS — Z885 Allergy status to narcotic agent status: Secondary | ICD-10-CM | POA: Diagnosis not present

## 2015-04-27 DIAGNOSIS — Z7982 Long term (current) use of aspirin: Secondary | ICD-10-CM | POA: Diagnosis not present

## 2015-04-27 DIAGNOSIS — Z8507 Personal history of malignant neoplasm of pancreas: Secondary | ICD-10-CM | POA: Diagnosis not present

## 2015-04-27 DIAGNOSIS — E538 Deficiency of other specified B group vitamins: Secondary | ICD-10-CM | POA: Diagnosis not present

## 2015-04-27 DIAGNOSIS — R079 Chest pain, unspecified: Secondary | ICD-10-CM

## 2015-04-27 DIAGNOSIS — E119 Type 2 diabetes mellitus without complications: Secondary | ICD-10-CM | POA: Insufficient documentation

## 2015-04-27 DIAGNOSIS — Z8249 Family history of ischemic heart disease and other diseases of the circulatory system: Secondary | ICD-10-CM | POA: Insufficient documentation

## 2015-04-27 DIAGNOSIS — I10 Essential (primary) hypertension: Secondary | ICD-10-CM | POA: Insufficient documentation

## 2015-04-27 DIAGNOSIS — Z823 Family history of stroke: Secondary | ICD-10-CM | POA: Insufficient documentation

## 2015-04-27 DIAGNOSIS — Z803 Family history of malignant neoplasm of breast: Secondary | ICD-10-CM | POA: Diagnosis not present

## 2015-04-27 DIAGNOSIS — I25810 Atherosclerosis of coronary artery bypass graft(s) without angina pectoris: Secondary | ICD-10-CM | POA: Insufficient documentation

## 2015-04-27 DIAGNOSIS — Z833 Family history of diabetes mellitus: Secondary | ICD-10-CM | POA: Insufficient documentation

## 2015-04-27 DIAGNOSIS — E785 Hyperlipidemia, unspecified: Secondary | ICD-10-CM | POA: Diagnosis not present

## 2015-04-27 DIAGNOSIS — G549 Nerve root and plexus disorder, unspecified: Secondary | ICD-10-CM | POA: Diagnosis not present

## 2015-04-27 HISTORY — DX: Malignant (primary) neoplasm, unspecified: C80.1

## 2015-04-27 HISTORY — DX: Atherosclerotic heart disease of native coronary artery without angina pectoris: I25.10

## 2015-04-27 HISTORY — DX: Essential (primary) hypertension: I10

## 2015-04-27 LAB — TROPONIN I

## 2015-04-27 LAB — BASIC METABOLIC PANEL
Anion gap: 7 (ref 5–15)
BUN: 19 mg/dL (ref 6–20)
CHLORIDE: 103 mmol/L (ref 101–111)
CO2: 27 mmol/L (ref 22–32)
CREATININE: 0.96 mg/dL (ref 0.61–1.24)
Calcium: 9.1 mg/dL (ref 8.9–10.3)
GFR calc Af Amer: >60 mL/min (ref >60)
Glucose, Bld: 218 mg/dL — ABNORMAL HIGH (ref 65–99)
Potassium: 4.2 mmol/L (ref 3.5–5.1)
Sodium: 137 mmol/L (ref 135–145)

## 2015-04-27 LAB — CBC
HCT: 38.6 % — ABNORMAL LOW (ref 40.0–52.0)
HEMOGLOBIN: 13 g/dL (ref 13.0–18.0)
MCH: 26.7 pg (ref 26.0–34.0)
MCHC: 33.6 g/dL (ref 32.0–36.0)
MCV: 79.4 fL — ABNORMAL LOW (ref 80.0–100.0)
Platelets: 153 10*3/uL (ref 150–440)
RBC: 4.86 MIL/uL (ref 4.40–5.90)
RDW: 15.1 % — ABNORMAL HIGH (ref 11.5–14.5)
WBC: 4.9 10*3/uL (ref 3.8–10.6)

## 2015-04-27 LAB — GLUCOSE, CAPILLARY: GLUCOSE-CAPILLARY: 165 mg/dL — AB (ref 65–99)

## 2015-04-27 MED ORDER — ZOLPIDEM TARTRATE 5 MG PO TABS: 5.0000 mg | ORAL_TABLET | Freq: Every evening | ORAL | Status: DC | PRN

## 2015-04-27 MED ORDER — ALBUTEROL SULFATE (2.5 MG/3ML) 0.083% IN NEBU: 2.5000 mg | INHALATION_SOLUTION | RESPIRATORY_TRACT | Status: DC | PRN

## 2015-04-27 MED ORDER — ATORVASTATIN CALCIUM 20 MG PO TABS
40.0000 mg | ORAL_TABLET | Freq: Every day | ORAL | Status: DC
  Administered 2015-04-27: 40 mg via ORAL
  Filled 2015-04-27: qty 2

## 2015-04-27 MED ORDER — INSULIN DETEMIR 100 UNIT/ML ~~LOC~~ SOLN
14.0000 [IU] | Freq: Every day | SUBCUTANEOUS | Status: DC
  Administered 2015-04-28: 14 [IU] via SUBCUTANEOUS
  Filled 2015-04-27 (×2): qty 0.14

## 2015-04-27 MED ORDER — SODIUM CHLORIDE 0.9 % IV SOLN: 250.0000 mL | INTRAVENOUS | Status: DC | PRN

## 2015-04-27 MED ORDER — SODIUM CHLORIDE 0.9 % IJ SOLN: 3.0000 mL | INTRAMUSCULAR | Status: DC | PRN

## 2015-04-27 MED ORDER — PANCRELIPASE (LIP-PROT-AMYL) 12000-38000 UNITS PO CPEP: 30000.0000 [IU] | ORAL_CAPSULE | Freq: Three times a day (TID) | ORAL | Status: DC

## 2015-04-27 MED ORDER — HEPARIN SODIUM (PORCINE) 5000 UNIT/ML IJ SOLN
5000.0000 [IU] | Freq: Three times a day (TID) | INTRAMUSCULAR | Status: DC
  Administered 2015-04-27 – 2015-04-28 (×3): 5000 [IU] via SUBCUTANEOUS
  Filled 2015-04-27 (×3): qty 1

## 2015-04-27 MED ORDER — INSULIN ASPART 100 UNIT/ML ~~LOC~~ SOLN: 4.0000 [IU] | Freq: Three times a day (TID) | SUBCUTANEOUS | Status: DC | PRN

## 2015-04-27 MED ORDER — ACETAMINOPHEN 325 MG PO TABS
650.0000 mg | ORAL_TABLET | Freq: Four times a day (QID) | ORAL | Status: DC | PRN
Start: 2015-04-27 — End: 2015-04-28

## 2015-04-27 MED ORDER — PANTOPRAZOLE SODIUM 40 MG PO TBEC
40.0000 mg | DELAYED_RELEASE_TABLET | Freq: Every day | ORAL | Status: DC
  Filled 2015-04-27: qty 1

## 2015-04-27 MED ORDER — SODIUM CHLORIDE 0.9 % IJ SOLN
3.0000 mL | Freq: Two times a day (BID) | INTRAMUSCULAR | Status: DC
  Administered 2015-04-28 (×2): 3 mL via INTRAVENOUS

## 2015-04-27 MED ORDER — ACETAMINOPHEN 650 MG RE SUPP: 650.0000 mg | Freq: Four times a day (QID) | RECTAL | Status: DC | PRN

## 2015-04-27 MED ORDER — ASPIRIN EC 325 MG PO TBEC: 325.0000 mg | DELAYED_RELEASE_TABLET | Freq: Every day | ORAL | Status: DC | PRN

## 2015-04-27 MED ORDER — INSULIN ASPART 100 UNIT/ML ~~LOC~~ SOLN
0.0000 [IU] | Freq: Three times a day (TID) | SUBCUTANEOUS | Status: DC
  Administered 2015-04-28: 2 [IU] via SUBCUTANEOUS
  Administered 2015-04-28: 1 [IU] via SUBCUTANEOUS
  Filled 2015-04-27: qty 2
  Filled 2015-04-27: qty 1

## 2015-04-27 MED ORDER — INSULIN ASPART 100 UNIT/ML ~~LOC~~ SOLN: 0.0000 [IU] | Freq: Every day | SUBCUTANEOUS | Status: DC

## 2015-04-27 MED ORDER — ONDANSETRON HCL 4 MG PO TABS: 4.0000 mg | ORAL_TABLET | Freq: Four times a day (QID) | ORAL | Status: DC | PRN

## 2015-04-27 MED ORDER — DIPHENHYDRAMINE HCL 25 MG PO TABS: 25.0000 mg | ORAL_TABLET | Freq: Every evening | ORAL | Status: DC | PRN

## 2015-04-27 MED ORDER — ONDANSETRON HCL 4 MG/2ML IJ SOLN: 4.0000 mg | Freq: Four times a day (QID) | INTRAMUSCULAR | Status: DC | PRN

## 2015-04-27 NOTE — ED Notes (Signed)
Family member (wife) went home

## 2015-04-27 NOTE — H&P (Signed)
[facility] [place] [facility] [place] NAME: Anthony Craig    MR#:  [id number]  DATE OF BIRTH:  [birth year]  DATE OF ADMISSION:  04/27/2015  PRIMARY CARE PHYSICIAN: Wilhemena Durie, MD   REQUESTING/REFERRING PHYSICIAN: Delman Kitten, MD  CHIEF COMPLAINT:   Chief Complaint  Patient presents with  . Chest Pain   chest pain since this afternoon.  HISTORY OF PRESENT ILLNESS:  Anthony Craig  is a 81 y.o. male with a known history of hypertension, CAD and diabetes. The patient presents to the ED due to chest discomfort will playing golf this afternoon. He has chest tightness with the night and arm tingling. He denies any cough, sputum or shortness of breath. He denies any nausea, vomiting or diaphoresis.  PAST MEDICAL HISTORY:   Past Medical History  Diagnosis Date  . Hypertension   . Coronary artery disease   . Cancer     pancreatic    PAST SURGICAL HISTORY:   Past Surgical History  Procedure Laterality Date  . Hernia repair Bilateral     2016-left, 2009-right  . Whipple procedure  2007  . Coronary artery bypass graft      2001-3 vessel  . Appendectomy      1977    SOCIAL HISTORY:   History  Substance Use Topics  . Smoking status: Former Smoker -- 0.50 packs/day for 30 years    Quit date: 10/06/1979  . Smokeless tobacco: Not on file  . Alcohol Use: 1.8 oz/week    3 Cans of beer per week     Comment: Previously heavy drinker    FAMILY HISTORY:   Family History  Problem Relation Age of Onset  . Heart disease Mother   . Cancer Mother 51    breast  . Heart disease Father   . COPD Brother   . Stroke Brother   . Diabetes Other     1 and 2    DRUG ALLERGIES:   Allergies  Allergen Reactions  . Doxycycline Other (See Comments)    Reaction:  Unknown   . Hydrocodone-Homatropine Other (See Comments)    Reaction:  Unknown     REVIEW OF SYSTEMS:  CONSTITUTIONAL: No fever, fatigue or weakness.  EYES: No blurred or  double vision.  EARS, NOSE, AND THROAT: No tinnitus or ear pain.  RESPIRATORY: No cough, shortness of breath, wheezing or hemoptysis.  CARDIOVASCULAR: Has chest pain, no orthopnea, edema.  GASTROINTESTINAL: No nausea, vomiting, diarrhea or abdominal pain.  GENITOURINARY: No dysuria, hematuria.  ENDOCRINE: No polyuria, nocturia,  HEMATOLOGY: No anemia, easy bruising or bleeding SKIN: No rash or lesion. MUSCULOSKELETAL: No joint pain or arthritis.   NEUROLOGIC: No tingling, numbness, weakness.  PSYCHIATRY: No anxiety or depression.   MEDICATIONS AT HOME:   Prior to Admission medications   Medication Sig Start Date End Date Taking? Authorizing Provider  aspirin EC 325 MG tablet Take 325 mg by mouth daily as needed (for chest pain).   Yes Historical Provider, MD  aspirin EC 81 MG tablet Take 81 mg by mouth at bedtime.   Yes Historical Provider, MD  atorvastatin (LIPITOR) 40 MG tablet Take 40 mg by mouth at bedtime.   Yes Historical Provider, MD  Coenzyme Q10 (COQ10) 200 MG CAPS Take 1 capsule by mouth daily.   Yes Historical Provider, MD  diphenhydrAMINE (BENADRYL) 25 MG tablet Take 25 mg by mouth at bedtime as needed for sleep.   Yes Historical Provider, MD  diphenoxylate-atropine (LOMOTIL)  2.5-0.025 MG per tablet Take 1 tablet by mouth 4 (four) times daily as needed for diarrhea or loose stools.   Yes Historical Provider, MD  insulin aspart (NOVOLOG) 100 UNIT/ML injection Inject 4 Units into the skin 3 (three) times daily with meals as needed for high blood sugar.    Yes Historical Provider, MD  insulin detemir (LEVEMIR) 100 UNIT/ML injection Inject 14 Units into the skin at bedtime.   Yes Historical Provider, MD  Multiple Vitamins-Minerals (MULTIVITAMIN PO) Take 1 tablet by mouth daily.   Yes Historical Provider, MD  omeprazole (PRILOSEC) 20 MG capsule Take 20 mg by mouth 2 (two) times daily.   Yes Historical Provider, MD  Pancrelipase, Lip-Prot-Amyl, 10000 UNITS CPEP Take 3 capsules by  mouth 3 (three) times daily with meals.   Yes Historical Provider, MD  simethicone (MYLICON) 401 MG chewable tablet Chew 125 mg by mouth every 6 (six) hours as needed for flatulence.   Yes Historical Provider, MD  vitamin B-12 (CYANOCOBALAMIN) 1000 MCG tablet Take 1,000 mcg by mouth daily.   Yes Historical Provider, MD      VITAL SIGNS:  Blood pressure 139/77, pulse 60, resp. rate 14, SpO2 96 %.  PHYSICAL EXAMINATION:  GENERAL:  81 y.o.-year-old patient lying in the bed with no acute distress.  EYES: Pupils equal, round, reactive to light and accommodation. No scleral icterus. Extraocular muscles intact.  HEENT: Head atraumatic, normocephalic. Oropharynx and nasopharynx clear.  NECK:  Supple, no jugular venous distention. No thyroid enlargement, no tenderness.  LUNGS: Normal breath sounds bilaterally, no wheezing, rales,rhonchi or crepitation. No use of accessory muscles of respiration.  CARDIOVASCULAR: S1, S2 normal. No murmurs, rubs, or gallops.  ABDOMEN: Soft, nontender, nondistended. Bowel sounds present. No organomegaly or mass.  EXTREMITIES: No pedal edema, cyanosis, or clubbing.  NEUROLOGIC: Cranial nerves II through XII are intact. Muscle strength 5/5 in all extremities. Sensation intact. Gait not checked.  PSYCHIATRIC: The patient is alert and oriented x 3.  SKIN: No obvious rash, lesion, or ulcer.   LABORATORY PANEL:   CBC  Recent Labs Lab 04/27/15 1859  WBC 4.9  HGB 13.0  HCT 38.6*  PLT 153   ------------------------------------------------------------------------------------------------------------------  Chemistries   Recent Labs Lab 04/27/15 1859  NA 137  K 4.2  CL 103  CO2 27  GLUCOSE 218*  BUN 19  CREATININE 0.96  CALCIUM 9.1   ------------------------------------------------------------------------------------------------------------------  Cardiac Enzymes  Recent Labs Lab 04/27/15 1859  TROPONINI <0.03    ------------------------------------------------------------------------------------------------------------------  RADIOLOGY:  Dg Chest 2 View  04/27/2015   CLINICAL DATA:  Chest discomfort for 3 hours.  EXAM: CHEST  2 VIEW  COMPARISON:  PA and lateral chest 10/31/2013.  FINDINGS: The patient is status post CABG. The lungs are clear. Heart size is normal. No pneumothorax or pleural fluid. Thoracic spondylosis noted.  IMPRESSION: No acute disease.   Electronically Signed   By: Inge Rise M.D.   On: 04/27/2015 19:11    EKG:   Orders placed or performed during the hospital encounter of 04/27/15  . ED EKG within 10 minutes  . ED EKG within 10 minutes    IMPRESSION AND PLAN:   Chest pain CAD Hypertension Diabetes   The patient will be placed for observation. I will continue telemetry monitor, follow-up troponin level, continue aspirin and Lipitor, and get stress test in a.m. For diabetes, I will continue May or and start a sliding scale.    All the records are reviewed and case discussed with ED provider.  Management plans discussed with the patient, family and they are in agreement.  CODE STATUS: Full code  TOTAL TIME TAKING CARE OF THIS PATIENT: 46 minutes.    Demetrios Loll M.D on 04/27/2015 at 9:59 PM  Between 7am to 6pm - Pager - [phone]  After 6pm go to [email or web address] - password [email or web address] [facility]  Office  [phone]  CC: Primary care physician; Wilhemena Durie, MD

## 2015-04-27 NOTE — ED Notes (Addendum)
Pt with SOB/CP after playing golf today where he was feeling dizzy. None on arrival but a "discomfort" "different" feeling in chest. Received 324 of ASA at [facility] Bypass 2002 142/83, 96% rs, NSR, HR 76

## 2015-04-27 NOTE — ED Provider Notes (Signed)
[facility] Emergency Department Provider Note  ____________________________________________  Time seen: Approximately 7:01 PM  I have reviewed the triage vital signs and the nursing notes.   HISTORY  Chief Complaint Chest Pain    HPI Anthony Craig is a 81 y.o. male history of previous coronary bypass in about 2002. Presents today stating that he was out on the golf course playing today and started to feel lightheaded after playing a few extra holes said his symptoms of lightheadedness got worse so went home and drank water. He then went to urgent care because his blood pressure was 110 and he thought he is a little dehydrated but then started to develop tightness in his chest that radiates up into his neck with some mild shortness of breath. At urgent care he was given 4 baby aspirin and transported to the emergency room. He states that his tightness in his chest has improved a fair amount he still feels a slight pressure across the chest. He does not feel short of breath now.  He does relate that this feels similar to symptoms she is experiencing but not as severe prior to his bypass.  No fevers or chills. No nausea or vomiting. No sweats. We discussed trying some nitroglycerin or additional pain medicine at this time but he states is very mild and does not wish for any additional treatment at this time.  No leg swelling or edema.   Past Medical History  Diagnosis Date  . Hypertension   . Coronary artery disease   . Cancer     pancreatic    Patient Active Problem List   Diagnosis Date Noted  . Allergic drug reaction 02/08/2015  . 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 B12 deficiency 02/08/2015  . 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 Chest discomfort 02/08/2015  . Diabetes 02/08/2015  . Dizziness 02/08/2015  . Benign essential tremor 02/08/2015  . Fatigue 02/08/2015  . Acid reflux  02/08/2015  . HLD (hyperlipidemia) 02/08/2015  . BP (high blood pressure) 02/08/2015  . Hyperthyroidism 02/08/2015  . Arterial blood pressure decreased 02/08/2015  . Hernia, inguinal, left 02/08/2015  . Cannot sleep 02/08/2015  . Cancer of pancreas 02/08/2015  . Pancreatitis 02/08/2015  . CA of prostate 02/08/2015    Past Surgical History  Procedure Laterality Date  . Hernia repair Bilateral     2016-left, 2009-right  . Whipple procedure  2007  . Coronary artery bypass graft      2001-3 vessel  . Appendectomy      1977    Current Outpatient Rx  Name  Route  Sig  Dispense  Refill  . aspirin EC 325 MG tablet   Oral   Take 325 mg by mouth daily as needed (for chest pain).         Marland Kitchen aspirin EC 81 MG tablet   Oral   Take 81 mg by mouth at bedtime.         Marland Kitchen atorvastatin (LIPITOR) 40 MG tablet   Oral   Take 40 mg by mouth at bedtime.         . Coenzyme Q10 (COQ10) 200 MG CAPS   Oral   Take 1 capsule by mouth daily.         . diphenhydrAMINE (BENADRYL) 25 MG tablet   Oral   Take 25 mg by mouth at bedtime as needed for sleep.         . diphenoxylate-atropine (LOMOTIL)  2.5-0.025 MG per tablet   Oral   Take 1 tablet by mouth 4 (four) times daily as needed for diarrhea or loose stools.         . insulin aspart (NOVOLOG) 100 UNIT/ML injection   Subcutaneous   Inject 4 Units into the skin 3 (three) times daily with meals as needed for high blood sugar.          . insulin detemir (LEVEMIR) 100 UNIT/ML injection   Subcutaneous   Inject 14 Units into the skin at bedtime.         . Multiple Vitamins-Minerals (MULTIVITAMIN PO)   Oral   Take 1 tablet by mouth daily.         Marland Kitchen omeprazole (PRILOSEC) 20 MG capsule   Oral   Take 20 mg by mouth 2 (two) times daily.         . Pancrelipase, Lip-Prot-Amyl, 10000 UNITS CPEP   Oral   Take 3 capsules by mouth 3 (three) times daily with meals.         . simethicone (MYLICON) 132 MG chewable tablet   Oral    Chew 125 mg by mouth every 6 (six) hours as needed for flatulence.         . vitamin B-12 (CYANOCOBALAMIN) 1000 MCG tablet   Oral   Take 1,000 mcg by mouth daily.           Allergies Doxycycline and Hydrocodone-homatropine  Family History  Problem Relation Age of Onset  . Heart disease Mother   . Cancer Mother 53    breast  . Heart disease Father   . COPD Brother   . Stroke Brother   . Diabetes Other     1 and 2    Social History History  Substance Use Topics  . Smoking status: Former Smoker -- 0.50 packs/day for 30 years    Quit date: 10/06/1979  . Smokeless tobacco: Not on file  . Alcohol Use: 1.8 oz/week    3 Cans of beer per week     Comment: Previously heavy drinker    Review of Systems Constitutional: No fever/chills Eyes: No visual changes. ENT: No sore throat. Cardiovascular: See history of present illness  Respiratory: See history of present illness Gastrointestinal: No abdominal pain.  No nausea, no vomiting.  No diarrhea.  No constipation. Genitourinary: Negative for dysuria. Musculoskeletal: Negative for back pain. Skin: Negative for rash. Neurological: Negative for headaches, focal weakness or numbness.  10-point ROS otherwise negative.  ____________________________________________   PHYSICAL EXAM:  VITAL SIGNS: ED Triage Vitals  Enc Vitals Group     BP 04/27/15 1853 159/105 mmHg     Pulse Rate 04/27/15 1853 66     Resp 04/27/15 1854 20     Temp --      Temp src --      SpO2 04/27/15 1853 97 %     Weight --      Height --      Head Cir --      Peak Flow --      Pain Score --      Pain Loc --      Pain Edu? --      Excl. in [place]? --     Constitutional: Alert and oriented. Well appearing and in no acute distress. Eyes: Conjunctivae are normal. PERRL. EOMI. Head: Atraumatic. Nose: No congestion/rhinnorhea. Mouth/Throat: Mucous membranes are moist.  Oropharynx non-erythematous. Neck: No stridor.   Cardiovascular: Normal rate,  regular  rhythm. Grossly normal heart sounds.  Good peripheral circulation. Old well-healed sternal scar. Respiratory: Normal respiratory effort.  No retractions. Lungs CTAB. Gastrointestinal: Soft and nontender. No distention. No abdominal bruits. No CVA tenderness. Musculoskeletal: No lower extremity tenderness nor edema.  No joint effusions. Neurologic:  Normal speech and language. No gross focal neurologic deficits are appreciated. No gait instability. Skin:  Skin is warm, dry and intact. No rash noted. Psychiatric: Mood and affect are normal. Speech and behavior are normal.  ____________________________________________   LABS (all labs ordered are listed, but only abnormal results are displayed)  Labs Reviewed  BASIC METABOLIC PANEL - Abnormal; Notable for the following:    Glucose, Bld 218 (*)    All other components within normal limits  CBC - Abnormal; Notable for the following:    HCT 38.6 (*)    MCV 79.4 (*)    RDW 15.1 (*)    All other components within normal limits  TROPONIN I   ____________________________________________  EKG  ED ECG REPORT I, Christie Viscomi, the attending physician, personally viewed and interpreted this ECG.  Date: 04/27/2015 Rhythm: normal sinus rhythm QRS Axis: normal Intervals: normal ST/T Wave abnormalities: normal Conduction Disutrbances: none Narrative Interpretation: unremarkable for acute ischemia  ____________________________________________  RADIOLOGY  CLINICAL DATA: Chest discomfort for 3 hours.  EXAM: CHEST 2 VIEW  COMPARISON: PA and lateral chest 10/31/2013.  FINDINGS: The patient is status post CABG. The lungs are clear. Heart size is normal. No pneumothorax or pleural fluid. Thoracic spondylosis noted.  IMPRESSION: No acute disease. ____________________________________________   PROCEDURES  Procedure(s) performed: None  Critical Care performed:  No  ____________________________________________   INITIAL IMPRESSION / ASSESSMENT AND PLAN / ED COURSE  Pertinent labs & imaging results that were available during my care of the patient were reviewed by me and considered in my medical decision making (see chart for details).  Chest discomfort. Exertional in nature. Differential diagnosis would include coronary artery disease, unstable angina, or other acute coronary syndrome. No acute GI symptoms. Not hypoxic without dyspnea. Given his clinical history, we will admit the patient for ongoing evaluation for chest pain to the medical service.  ----------------------------------------- 9:15 PM on 04/27/2015 -----------------------------------------  Patient is presently pain and symptom-free at this time. Discussed with Dr. Bridgett Larsson of hospital service. ____________________________________________   FINAL CLINICAL IMPRESSION(S) / ED DIAGNOSES  Final diagnoses:  Chest pain, moderate coronary artery risk      Delman Kitten, MD 04/27/15 2115

## 2015-04-27 NOTE — ED Notes (Signed)
Transported to xray 

## 2015-04-28 ENCOUNTER — Observation Stay: Payer: Commercial Managed Care - HMO

## 2015-04-28 LAB — TROPONIN I
Troponin I: <0.03 ng/mL (ref <0.031)
Troponin I: <0.03 ng/mL (ref <0.031)

## 2015-04-28 LAB — NM MYOCAR MULTI W/SPECT W/WALL MOTION / EF
CHL CUP NUCLEAR SRS: 5
CHL CUP NUCLEAR SSS: 1
CSEPEDS: 6 s
CSEPEW: 7 METS
LV dias vol: 68 mL
LV sys vol: 33 mL
NUC STRESS TID: 0.93
Peak HR: 118 {beats}/min
SDS: 1

## 2015-04-28 LAB — HEMOGLOBIN A1C: HEMOGLOBIN A1C: 7.5 % — AB (ref 4.0–6.0)

## 2015-04-28 LAB — GLUCOSE, CAPILLARY
GLUCOSE-CAPILLARY: 152 mg/dL — AB (ref 65–99)
Glucose-Capillary: 136 mg/dL — ABNORMAL HIGH (ref 65–99)

## 2015-04-28 MED ORDER — TECHNETIUM TC 99M SESTAMIBI - CARDIOLITE
32.0000 | Freq: Once | INTRAVENOUS | Status: AC | PRN
  Administered 2015-04-28: 32 via INTRAVENOUS

## 2015-04-28 MED ORDER — PANCRELIPASE (LIP-PROT-AMYL) 12000-38000 UNITS PO CPEP
36000.0000 [IU] | ORAL_CAPSULE | Freq: Three times a day (TID) | ORAL | Status: DC
  Administered 2015-04-28: 36000 [IU] via ORAL
  Filled 2015-04-28: qty 3

## 2015-04-28 MED ORDER — TECHNETIUM TC 99M SESTAMIBI - CARDIOLITE
14.1000 | Freq: Once | INTRAVENOUS | Status: AC | PRN
  Administered 2015-04-28: 08:00:00 14.1 via INTRAVENOUS

## 2015-04-28 NOTE — Discharge Instructions (Signed)
°  DIET:  Cardiac diet and Diabetic diet  DISCHARGE CONDITION:  Good  ACTIVITY:  Activity as tolerated  OXYGEN:  Home Oxygen: No.   Oxygen Delivery: room air  DISCHARGE LOCATION:  home   If you experience worsening of your admission symptoms, develop shortness of breath, life threatening emergency, suicidal or homicidal thoughts you must seek medical attention immediately by calling 911 or calling your MD immediately  if symptoms less severe.  You Must read complete instructions/literature along with all the possible adverse reactions/side effects for all the Medicines you take and that have been prescribed to you. Take any new Medicines after you have completely understood and accpet all the possible adverse reactions/side effects.   Please note  You were cared for by a hospitalist during your hospital stay. If you have any questions about your discharge medications or the care you received while you were in the hospital after you are discharged, you can call the unit and asked to speak with the hospitalist on call if the hospitalist that took care of you is not available. Once you are discharged, your primary care physician will handle any further medical issues. Please note that NO REFILLS for any discharge medications will be authorized once you are discharged, as it is imperative that you return to your primary care physician (or establish a relationship with a primary care physician if you do not have one) for your aftercare needs so that they can reassess your need for medications and monitor your lab values.

## 2015-04-28 NOTE — Progress Notes (Signed)
Pt arrived on wheelchair from ED. Pt is alert and oriented, has no complaints of pain, skin is warm to touch with old surgical scars to abdomen and scattered brown spots to back. Skin verified by Corky Sing, RN.

## 2015-04-28 NOTE — Progress Notes (Signed)
Patient is discharge home in a stable  condition after stress test negative, summary given , pt to call for f/u appt. On Monday , verbalized understanding , left with wife

## 2015-04-28 NOTE — Discharge Summary (Signed)
[facility] NAME: Garhett Bernhard    MR#:  [id number]  DATE OF BIRTH:  17-Sep-1928  DATE OF ADMISSION:  04/27/2015 ADMITTING PHYSICIAN: Demetrios Loll, MD  DATE OF DISCHARGE: 04/28/2015  PRIMARY CARE PHYSICIAN: Wilhemena Durie, MD    ADMISSION DIAGNOSIS:  Chest pain, moderate coronary artery risk [R07.9]  DISCHARGE DIAGNOSIS:  Active Problems:   Chest pain   SECONDARY DIAGNOSIS:   Past Medical History  Diagnosis Date  . Hypertension   . Coronary artery disease   . Cancer     pancreatic    HOSPITAL COURSE:   81 year old male with past medical history of hypertension, coronary disease status post bypass, GERD, diabetes who presented to the hospital due to chest pain.  #1 chest pain-given patient's family history and also history of coronary disease and bypass he was admitted to the hospital and observed on telemetry. Patient had 3 sets of cardiac markers checked which were negative. -Patient underwent a nuclear medicine stress test which showed no evidence of wall motion abnormalities or EKG changes suggestive of ischemia. His ejection fraction was slightly low at 45% without any further followed as an outpatient. -Patient is currently chest pain-free and hemodynamically stable and therefore being discharged home. Patient will continue his aspirin, atorvastatin.  #2 diabetes type 2 without compensation-patient's blood sugars remained stable. -Patient will continue his Levemir, NovoLog with meals.  #3 GERD-continue omeprazole.  #4 history of pancreatic cancer-in remission. Continue pancreatic supplements.  #5 hyperlipidemia-continue atorvastatin.  DISCHARGE CONDITIONS:   Stable  CONSULTS OBTAINED:     DRUG ALLERGIES:   Allergies  Allergen Reactions  . Doxycycline Other (See Comments)    Reaction:  Unknown  Pt refuses any allergy reaction  . Hydrocodone-Homatropine Other (See Comments)    Reaction:   Unknown  Pt refuses any allergy reaction    DISCHARGE MEDICATIONS:   Current Discharge Medication List    CONTINUE these medications which have NOT CHANGED   Details  aspirin EC 325 MG tablet Take 325 mg by mouth daily as needed (for chest pain).    atorvastatin (LIPITOR) 40 MG tablet Take 40 mg by mouth at bedtime.    Coenzyme Q10 (COQ10) 200 MG CAPS Take 1 capsule by mouth daily.    diphenhydrAMINE (BENADRYL) 25 MG tablet Take 25 mg by mouth at bedtime as needed for sleep.    diphenoxylate-atropine (LOMOTIL) 2.5-0.025 MG per tablet Take 1 tablet by mouth 4 (four) times daily as needed for diarrhea or loose stools.    insulin aspart (NOVOLOG) 100 UNIT/ML injection Inject 4 Units into the skin 3 (three) times daily with meals as needed for high blood sugar.     insulin detemir (LEVEMIR) 100 UNIT/ML injection Inject 14 Units into the skin at bedtime.    Multiple Vitamins-Minerals (MULTIVITAMIN PO) Take 1 tablet by mouth daily.    omeprazole (PRILOSEC) 20 MG capsule Take 20 mg by mouth 2 (two) times daily.    Pancrelipase, Lip-Prot-Amyl, 10000 UNITS CPEP Take 3 capsules by mouth 3 (three) times daily with meals.    simethicone (MYLICON) 315 MG chewable tablet Chew 125 mg by mouth every 6 (six) hours as needed for flatulence.    vitamin B-12 (CYANOCOBALAMIN) 1000 MCG tablet Take 1,000 mcg by mouth daily.         DISCHARGE INSTRUCTIONS:   DIET:  Cardiac diet and Diabetic diet  DISCHARGE CONDITION:  Stable  ACTIVITY:  Activity as tolerated  OXYGEN:  Home Oxygen: No.   Oxygen Delivery: room air  DISCHARGE LOCATION:  home   If you experience worsening of your admission symptoms, develop shortness of breath, life threatening emergency, suicidal or homicidal thoughts you must seek medical attention immediately by calling 911 or calling your MD immediately  if symptoms less severe.  You Must read complete instructions/literature along with all the possible adverse  reactions/side effects for all the Medicines you take and that have been prescribed to you. Take any new Medicines after you have completely understood and accpet all the possible adverse reactions/side effects.   Please note  You were cared for by a hospitalist during your hospital stay. If you have any questions about your discharge medications or the care you received while you were in the hospital after you are discharged, you can call the unit and asked to speak with the hospitalist on call if the hospitalist that took care of you is not available. Once you are discharged, your primary care physician will handle any further medical issues. Please note that NO REFILLS for any discharge medications will be authorized once you are discharged, as it is imperative that you return to your primary care physician (or establish a relationship with a primary care physician if you do not have one) for your aftercare needs so that they can reassess your need for medications and monitor your lab values.     Today   Patient is chest pain-free. Has some tingling in his left upper extremity.  VITAL SIGNS:  Blood pressure 138/71, pulse 64, temperature 97.8 F (36.6 C), temperature source Oral, resp. rate 16, weight 66.044 kg (145 lb 9.6 oz), SpO2 97 %.  I/O:   Intake/Output Summary (Last 24 hours) at 04/28/15 1431 Last data filed at 04/28/15 1341  Gross per 24 hour  Intake    240 ml  Output      0 ml  Net    240 ml    PHYSICAL EXAMINATION:  GENERAL:  81 y.o.-year-old patient lying in the bed with no acute distress.  EYES: Pupils equal, round, reactive to light and accommodation. No scleral icterus. Extraocular muscles intact.  HEENT: Head atraumatic, normocephalic. Oropharynx and nasopharynx clear.  NECK:  Supple, no jugular venous distention. No thyroid enlargement, no tenderness.  LUNGS: Normal breath sounds bilaterally, no wheezing, rales,rhonchi. No use of accessory muscles of respiration.   CARDIOVASCULAR: S1, S2 normal. No murmurs, rubs, or gallops.  ABDOMEN: Soft, non-tender, non-distended. Bowel sounds present. No organomegaly or mass.  EXTREMITIES: No pedal edema, cyanosis, or clubbing.  NEUROLOGIC: Cranial nerves II through XII are intact. No focal motor or sensory defecits b/l.  PSYCHIATRIC: The patient is alert and oriented x 3. Good affect.  SKIN: No obvious rash, lesion, or ulcer.   DATA REVIEW:   CBC  Recent Labs Lab 04/27/15 1859  WBC 4.9  HGB 13.0  HCT 38.6*  PLT 153    Chemistries   Recent Labs Lab 04/27/15 1859  NA 137  K 4.2  CL 103  CO2 27  GLUCOSE 218*  BUN 19  CREATININE 0.96  CALCIUM 9.1    Cardiac Enzymes  Recent Labs Lab 04/28/15 1031  TROPONINI <0.03    Microbiology Results  No results found for this or any previous visit.  RADIOLOGY:  Dg Chest 2 View  04/27/2015   CLINICAL DATA:  Chest discomfort for 3 hours.  EXAM: CHEST  2 VIEW  COMPARISON:  PA and lateral chest 10/31/2013.  FINDINGS: The patient  is status post CABG. The lungs are clear. Heart size is normal. No pneumothorax or pleural fluid. Thoracic spondylosis noted.  IMPRESSION: No acute disease.   Electronically Signed   By: Inge Rise M.D.   On: 04/27/2015 19:11   Nm Myocar Multi W/spect W/wall Motion / Ef  04/28/2015    Blood pressure demonstrated a hypertensive response to exercise.  There was no ST segment deviation noted during stress.  The study is normal.  This is a low risk study.  The left ventricular ejection fraction is moderately decreased (30-44%).       Management plans discussed with the patient, family and they are in agreement.  CODE STATUS:     Code Status Orders        Start     Ordered   04/27/15 2221  Full code   Continuous     04/27/15 2220    Advance Directive Documentation        Most Recent Value   Type of Advance Directive  Living will   Pre-existing out of facility DNR order (yellow form or pink MOST form)      "MOST" Form in Place?        TOTAL TIME TAKING CARE OF THIS PATIENT: 40 minutes.    Henreitta Leber M.D on 04/28/2015 at 2:31 PM  Between 7am to 6pm - Pager - [phone]  After 6pm go to [email or web address] - password [email or web address] [facility]  Office  [phone]  CC: Primary care physician; Wilhemena Durie, MD

## 2015-04-30 ENCOUNTER — Telehealth: Payer: Self-pay | Admitting: Family Medicine

## 2015-04-30 ENCOUNTER — Ambulatory Visit (INDEPENDENT_AMBULATORY_CARE_PROVIDER_SITE_OTHER): Payer: Commercial Managed Care - HMO | Admitting: Family Medicine

## 2015-04-30 VITALS — BP 132/64 | HR 58 | Temp 97.3°F | Resp 16 | Ht 69.5 in | Wt 148.0 lb

## 2015-04-30 DIAGNOSIS — G459 Transient cerebral ischemic attack, unspecified: Secondary | ICD-10-CM

## 2015-04-30 DIAGNOSIS — R079 Chest pain, unspecified: Secondary | ICD-10-CM | POA: Diagnosis not present

## 2015-04-30 DIAGNOSIS — I10 Essential (primary) hypertension: Secondary | ICD-10-CM

## 2015-04-30 DIAGNOSIS — Z09 Encounter for follow-up examination after completed treatment for conditions other than malignant neoplasm: Secondary | ICD-10-CM | POA: Diagnosis not present

## 2015-04-30 NOTE — Telephone Encounter (Signed)
Pt was discharged from [facility] on 04/28/2015 for chest pain.  Pt is scheduled for a hospital follow up today/MW

## 2015-04-30 NOTE — Telephone Encounter (Signed)
Noted-aa 

## 2015-04-30 NOTE — Progress Notes (Signed)
Patient ID: Isam Unrein, male   DOB: 03/11/1928, 81 y.o.   MRN: [id number]   Pax Reasoner  MRN: [id number] DOB: 1928-01-21  Subjective:  HPI  1. Hospital discharge follow-up Patient is an 81 year old male who presents for follow up of his recent hospitalization for chest pain.  He had negative cardiac markers x 3.  Patient was admitted to [facility] from 04/27/15-04/28/15.  He was observed on Telemetry.  He had nuclear stress test and EKG, both of which were stable with an EF of 45%.  Other testing done was CBC, Met B, Troponin and chest x-ray; all of which were also normal.  No changes were made with his medication and no further work up is needed at this point per the discharge summary.  2. Chest pain, unspecified chest pain type Patient has not had any more pain.  He states that he actually did not have pain but a discomfort, described more as a heaviness or pressure.    3. Essential hypertension Patient was having dizziness/lightheaded when bending over and getting up.  He noted this on Friday when playing golf.  He was only able to play 6 holes due to feeling bad.  Once he had gotten home his wife checked his blood pressure and it was 123/50 and the heart rate was 44.  He states he had another reading of 112/59 and heart rate was 54.  These readings are lower than his normal.   Patient Active Problem List   Diagnosis Date Noted  . Chest pain 04/27/2015  . Allergic drug reaction 02/08/2015  . 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 B12 deficiency 02/08/2015  . 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 Chest discomfort 02/08/2015  . Diabetes 02/08/2015  . Dizziness 02/08/2015  . Benign essential tremor 02/08/2015  . Fatigue 02/08/2015  . Acid reflux 02/08/2015  . HLD (hyperlipidemia) 02/08/2015  . BP (high blood pressure) 02/08/2015  . Hyperthyroidism 02/08/2015  . Arterial blood pressure decreased 02/08/2015  . Hernia,  inguinal, left 02/08/2015  . Cannot sleep 02/08/2015  . Cancer of pancreas 02/08/2015  . Pancreatitis 02/08/2015  . CA of prostate 02/08/2015    Past Medical History  Diagnosis Date  . Hypertension   . Coronary artery disease   . Cancer     pancreatic    History   Social History  . Marital Status: Married    Spouse Name: N/A  . Number of Children: N/A  . Years of Education: N/A   Occupational History  . Not on file.   Social History Main Topics  . Smoking status: Former Smoker -- 0.50 packs/day for 30 years    Quit date: 10/06/1979  . Smokeless tobacco: Not on file  . Alcohol Use: 1.8 oz/week    3 Cans of beer per week     Comment: Previously heavy drinker  . Drug Use: Not on file  . Sexual Activity: Not on file   Other Topics Concern  . Not on file   Social History Narrative    Outpatient Prescriptions Prior to Visit  Medication Sig Dispense Refill  . aspirin EC 325 MG tablet Take 325 mg by mouth daily as needed (for chest pain).    Marland Kitchen atorvastatin (LIPITOR) 40 MG tablet Take 40 mg by mouth at bedtime.    . Coenzyme Q10 (COQ10) 200 MG CAPS Take 1 capsule by mouth daily.    . diphenhydrAMINE (BENADRYL) 25 MG tablet Take 25  mg by mouth at bedtime as needed for sleep.    . diphenoxylate-atropine (LOMOTIL) 2.5-0.025 MG per tablet Take 1 tablet by mouth 4 (four) times daily as needed for diarrhea or loose stools.    . insulin aspart (NOVOLOG) 100 UNIT/ML injection Inject 4 Units into the skin 3 (three) times daily with meals as needed for high blood sugar.     . insulin detemir (LEVEMIR) 100 UNIT/ML injection Inject 14 Units into the skin at bedtime.    . Multiple Vitamins-Minerals (MULTIVITAMIN PO) Take 1 tablet by mouth daily.    Marland Kitchen omeprazole (PRILOSEC) 20 MG capsule Take 20 mg by mouth 2 (two) times daily.    . Pancrelipase, Lip-Prot-Amyl, 10000 UNITS CPEP Take 3 capsules by mouth 3 (three) times daily with meals.    . simethicone (MYLICON) 594 MG chewable tablet  Chew 125 mg by mouth every 6 (six) hours as needed for flatulence.    . vitamin B-12 (CYANOCOBALAMIN) 1000 MCG tablet Take 1,000 mcg by mouth daily.     No facility-administered medications prior to visit.    Allergies  Allergen Reactions  . Doxycycline Other (See Comments)    Reaction:  Unknown  Pt refuses any allergy reaction  . Hydrocodone-Homatropine Other (See Comments)    Reaction:  Unknown  Pt refuses any allergy reaction    Review of Systems  Constitutional: Negative.   Respiratory: Negative.   Cardiovascular: Negative.   Gastrointestinal: Negative.   Genitourinary: Negative.   Neurological: Negative for dizziness and headaches.       Numbness in the left hand  Psychiatric/Behavioral: Negative.    Objective:  There were no vitals taken for this visit.  Physical Exam  Constitutional: He is well-developed, well-nourished, and in no distress.  HENT:  Head: Normocephalic and atraumatic.  Right Ear: External ear normal.  Left Ear: External ear normal.  Nose: Nose normal.  Cardiovascular: Normal rate, regular rhythm and normal heart sounds.   Pulmonary/Chest: Effort normal.  Abdominal: Soft.  Skin: Skin is warm and dry.  Psychiatric: Mood, memory, affect and judgment normal.    Assessment and Plan :  Ischemic cardiomyopathy  with EF of 45% All risk factors treated and patient appears to be stable. I think the situation to his hospitalization was extreme heat couple with some dehydration. This was discussed with patient and his wife. Will actually  watch for hypotension as patient ages. Pancreatic cancer. Status post Whipple procedure 2007. Patient doing extremely well from this. I have done the exam and reviewed the above chart and it is accurate to the best of my knowledge.  Miguel Aschoff MD [facility] 04/30/2015 1:42 PM

## 2015-05-01 ENCOUNTER — Encounter: Payer: Self-pay | Admitting: Family Medicine

## 2015-05-04 ENCOUNTER — Ambulatory Visit
Admission: RE | Admit: 2015-05-04 | Discharge: 2015-05-04 | Disposition: A | Discharge status: Home / Self Care | Payer: Commercial Managed Care - HMO | Source: Ambulatory Visit | Attending: Family Medicine | Admitting: Family Medicine

## 2015-05-04 DIAGNOSIS — G459 Transient cerebral ischemic attack, unspecified: Secondary | ICD-10-CM | POA: Diagnosis present

## 2015-05-04 DIAGNOSIS — I6521 Occlusion and stenosis of right carotid artery: Secondary | ICD-10-CM | POA: Diagnosis not present

## 2015-05-04 NOTE — Addendum Note (Signed)
Addended by: Mar Daring on: 05/04/2015 11:32 AM   Modules accepted: Miquel Dunn

## 2015-05-17 ENCOUNTER — Other Ambulatory Visit: Payer: Self-pay

## 2015-05-17 MED ORDER — ATORVASTATIN CALCIUM 40 MG PO TABS: 40.0000 mg | ORAL_TABLET | Freq: Every day | ORAL | Status: DC

## 2015-05-28 ENCOUNTER — Ambulatory Visit (INDEPENDENT_AMBULATORY_CARE_PROVIDER_SITE_OTHER): Payer: Commercial Managed Care - HMO | Admitting: Family Medicine

## 2015-05-28 ENCOUNTER — Encounter: Payer: Self-pay | Admitting: Family Medicine

## 2015-05-28 ENCOUNTER — Ambulatory Visit: Payer: Commercial Managed Care - HMO | Admitting: Family Medicine

## 2015-05-28 VITALS — BP 98/52 | HR 52 | Temp 97.4°F | Resp 16 | Wt 148.0 lb

## 2015-05-28 DIAGNOSIS — I959 Hypotension, unspecified: Secondary | ICD-10-CM | POA: Diagnosis not present

## 2015-05-28 DIAGNOSIS — J309 Allergic rhinitis, unspecified: Secondary | ICD-10-CM

## 2015-05-28 MED ORDER — FLUTICASONE PROPIONATE 50 MCG/ACT NA SUSP: 2.0000 | Freq: Every day | NASAL | Status: DC

## 2015-05-28 NOTE — Progress Notes (Signed)
Patient ID: Anthony Craig, male   DOB: 05/25/1928, 81 y.o.   MRN: [id number]    Subjective:  HPI Pt reports this is a follow up appt from his hospital follow up. No changes were made at [facility]. Per pt he is following up on his blood pressure being low. At home he has been running 104-120/50-60's. He has a little dizziness since the ER visit, nothing like he was having when he went to the ER. Pt does not take any blood pressure medications.   Prior to Admission medications   Medication Sig Start Date End Date Taking? Authorizing Provider  aspirin EC 325 MG tablet Take 325 mg by mouth daily as needed (for chest pain).   Yes Historical Provider, MD  atorvastatin (LIPITOR) 40 MG tablet Take 1 tablet (40 mg total) by mouth at bedtime. 05/17/15  Yes Cecilia Vancleve Maceo Pro., MD  calcium-vitamin D 250-100 MG-UNIT per tablet Take 1 tablet by mouth 2 (two) times daily.   Yes Historical Provider, MD  cholecalciferol (VITAMIN D) 1000 UNITS tablet Take 1,000 Units by mouth daily.   Yes Historical Provider, MD  Coenzyme Q10 (COQ10) 200 MG CAPS Take 1 capsule by mouth daily.   Yes Historical Provider, MD  diphenhydrAMINE (BENADRYL) 25 MG tablet Take 25 mg by mouth at bedtime as needed for sleep.   Yes Historical Provider, MD  diphenoxylate-atropine (LOMOTIL) 2.5-0.025 MG per tablet Take 1 tablet by mouth 4 (four) times daily as needed for diarrhea or loose stools.   Yes Historical Provider, MD  insulin aspart (NOVOLOG) 100 UNIT/ML injection Inject 4 Units into the skin 3 (three) times daily with meals as needed for high blood sugar.    Yes Historical Provider, MD  insulin detemir (LEVEMIR) 100 UNIT/ML injection Inject 14 Units into the skin at bedtime.   Yes Historical Provider, MD  Multiple Vitamins-Minerals (MULTIVITAMIN PO) Take 1 tablet by mouth daily.   Yes Historical Provider, MD  omeprazole (PRILOSEC) 20 MG capsule Take 20 mg by mouth 2 (two) times daily.   Yes Historical Provider, MD  Pancrelipase,  Lip-Prot-Amyl, 10000 UNITS CPEP Take 3 capsules by mouth 3 (three) times daily with meals.   Yes Historical Provider, MD  simethicone (MYLICON) 286 MG chewable tablet Chew 125 mg by mouth every 6 (six) hours as needed for flatulence.   Yes Historical Provider, MD  vitamin B-12 (CYANOCOBALAMIN) 1000 MCG tablet Take 1,000 mcg by mouth daily.   Yes Historical Provider, MD    Patient Active Problem List   Diagnosis Date Noted  . Chest pain 04/27/2015  . Allergic drug reaction 02/08/2015  . 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 B12 deficiency 02/08/2015  . 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 Chest discomfort 02/08/2015  . Diabetes 02/08/2015  . Dizziness 02/08/2015  . Benign essential tremor 02/08/2015  . Fatigue 02/08/2015  . Acid reflux 02/08/2015  . HLD (hyperlipidemia) 02/08/2015  . BP (high blood pressure) 02/08/2015  . Hyperthyroidism 02/08/2015  . Arterial blood pressure decreased 02/08/2015  . Hernia, inguinal, left 02/08/2015  . Cannot sleep 02/08/2015  . Cancer of pancreas 02/08/2015  . Pancreatitis 02/08/2015  . CA of prostate 02/08/2015    Past Medical History  Diagnosis Date  . Hypertension   . Coronary artery disease   . Cancer     pancreatic    Social History   Social History  . Marital Status: Married    Spouse Name: N/A  . Number of  Children: N/A  . Years of Education: N/A   Occupational History  . Not on file.   Social History Main Topics  . Smoking status: Former Smoker -- 0.50 packs/day for 30 years    Quit date: 10/06/1979  . Smokeless tobacco: Not on file  . Alcohol Use: 1.8 oz/week    3 Cans of beer per week     Comment: Previously heavy drinker  . Drug Use: Not on file  . Sexual Activity: Not on file   Other Topics Concern  . Not on file   Social History Narrative    Allergies  Allergen Reactions  . Doxycycline Other (See Comments)    Reaction:  Unknown  Pt refuses any allergy  reaction  . Hydrocodone-Homatropine Other (See Comments)    Reaction:  Unknown  Pt refuses any allergy reaction    Review of Systems  Constitutional: Negative.   HENT: Negative.   Eyes: Negative.   Respiratory: Negative.   Cardiovascular: Negative.   Gastrointestinal: Negative.   Genitourinary: Negative.   Musculoskeletal: Negative.   Skin: Negative.   Neurological: Positive for dizziness.  Endo/Heme/Allergies: Negative.   Psychiatric/Behavioral: Negative.     Immunization History  Administered Date(s) Administered  . Pneumococcal Conjugate-13 11/14/2013  . Pneumococcal Polysaccharide-23 06/08/2010  . Td 06/08/2006   Objective:  BP 98/52 mmHg  Pulse 52  Temp(Src) 97.4 F (36.3 C) (Oral)  Resp 16  Wt 148 lb (67.132 kg)  Physical Exam  Constitutional: He is oriented to person, place, and time and well-developed, well-nourished, and in no distress.  HENT:  Head: Normocephalic and atraumatic.  Right Ear: External ear normal.  Left Ear: External ear normal.  Nose: Nose normal.  Eyes: Conjunctivae and EOM are normal. Pupils are equal, round, and reactive to light.  Neck: Normal range of motion. Neck supple.  Cardiovascular: Normal rate, regular rhythm, normal heart sounds and intact distal pulses.   Pulmonary/Chest: Effort normal and breath sounds normal.  Abdominal: Soft. Bowel sounds are normal.  Musculoskeletal: Normal range of motion.  Neurological: He is alert and oriented to person, place, and time. He has normal reflexes. Gait normal. GCS score is 15.  Skin: Skin is warm and dry.  Psychiatric: Mood, memory, affect and judgment normal.    Lab Results  Component Value Date   WBC 4.9 04/27/2015   HGB 13.0 04/27/2015   HCT 38.6* 04/27/2015   PLT 153 04/27/2015   GLUCOSE 218* 04/27/2015   CHOL 117 09/05/2014   TRIG 72 09/05/2014   HDL 44 09/05/2014   LDLCALC 59 09/05/2014   TSH 0.711 03/15/2015   HGBA1C 7.5* 04/27/2015    CMP     Component Value  Date/Time   NA 137 04/27/2015 1859   NA 140 03/15/2015 1620   NA 140 01/17/2014 0413   K 4.2 04/27/2015 1859   K 3.8 01/17/2014 0413   CL 103 04/27/2015 1859   CL 107 01/17/2014 0413   CO2 27 04/27/2015 1859   CO2 27 01/17/2014 0413   GLUCOSE 218* 04/27/2015 1859   GLUCOSE 139* 03/15/2015 1620   GLUCOSE 125* 01/17/2014 0413   BUN 19 04/27/2015 1859   BUN 17 03/15/2015 1620   BUN 16 01/17/2014 0413   CREATININE 0.96 04/27/2015 1859   CREATININE 1.0 09/05/2014   CREATININE 1.10 01/17/2014 0413   CALCIUM 9.1 04/27/2015 1859   CALCIUM 8.0* 01/17/2014 0413   PROT 6.9 03/15/2015 1620   PROT 7.4 01/16/2014 0201   ALBUMIN 4.0 01/16/2014 0201  AST 25 03/15/2015 1620   AST 151* 01/16/2014 0201   ALT 27 03/15/2015 1620   ALT 100* 01/16/2014 0201   ALKPHOS 74 03/15/2015 1620   ALKPHOS 88 01/16/2014 0201   BILITOT 0.4 03/15/2015 1620   BILITOT 0.5 01/16/2014 0201   GFRNONAA >60 04/27/2015 1859   GFRNONAA >60 01/17/2014 0413   GFRAA >60 04/27/2015 1859   GFRAA >60 01/17/2014 0413    Assessment and Plan :  1. Allergic rhinitis, unspecified allergic rhinitis type  - fluticasone (FLONASE) 50 MCG/ACT nasal spray; Place 2 sprays into both nostrils daily.  Dispense: 16 g; Refill: 12  2. Hypotension, unspecified hypotension type Pt does not take BP medications, stay hydrated and stand easily. 3.Pancreatic Cancer Disease free presently 4. Pancreatic insufficiency  patient post Whipple procedure 5. Type 2 diabetes Stable 6. CAD/status post CABG All risk factors treated. Patient was seen and examined by Dr. Miguel Aschoff, and noted scribed by Webb Laws, [place] MD [facility] 05/28/2015 1:45 PM

## 2015-05-29 ENCOUNTER — Ambulatory Visit: Payer: Commercial Managed Care - HMO | Admitting: Family Medicine

## 2015-06-14 ENCOUNTER — Telehealth: Payer: Self-pay | Admitting: Family Medicine

## 2015-06-14 NOTE — Telephone Encounter (Signed)
See below please-aa 

## 2015-06-14 NOTE — Telephone Encounter (Signed)
Pt states he would like to be called when his medication is sent to pharmacy. CC

## 2015-06-14 NOTE — Telephone Encounter (Signed)
Pt called left message he needed to speak with Dr. Rosanna Randy ASAP today [phone] CC

## 2015-06-14 NOTE — Telephone Encounter (Signed)
lmtcb-aa 

## 2015-06-14 NOTE — Telephone Encounter (Signed)
Pt called back saying he needs a Rx of his creon 1200 units.    He uses [facility]  He takes 2 capsules at each meal.  Minimum of 4 day supply  His call back is [phone].  Thanks [employer]

## 2015-06-14 NOTE — Telephone Encounter (Signed)
Please review refill request-aa

## 2015-06-15 MED ORDER — PANCRELIPASE (LIP-PROT-AMYL) 12000-38000 UNITS PO CPEP: 12000.0000 [IU] | ORAL_CAPSULE | Freq: Four times a day (QID) | ORAL | Status: DC

## 2015-06-15 NOTE — Telephone Encounter (Signed)
Refill for 1 year 

## 2015-06-15 NOTE — Telephone Encounter (Signed)
Done-aa 

## 2015-09-18 ENCOUNTER — Ambulatory Visit (INDEPENDENT_AMBULATORY_CARE_PROVIDER_SITE_OTHER): Payer: Commercial Managed Care - HMO | Admitting: Family Medicine

## 2015-09-18 VITALS — BP 138/72 | HR 72 | Temp 97.5°F | Resp 14 | Wt 147.0 lb

## 2015-09-18 DIAGNOSIS — Z794 Long term (current) use of insulin: Secondary | ICD-10-CM

## 2015-09-18 DIAGNOSIS — E118 Type 2 diabetes mellitus with unspecified complications: Secondary | ICD-10-CM

## 2015-09-18 DIAGNOSIS — R42 Dizziness and giddiness: Secondary | ICD-10-CM | POA: Diagnosis not present

## 2015-09-18 LAB — POCT GLYCOSYLATED HEMOGLOBIN (HGB A1C): Hemoglobin A1C: 7.5

## 2015-09-18 NOTE — Progress Notes (Signed)
Patient ID: Anthony Craig, male   DOB: 11-17-1927, 82 y.o.   MRN: [id number]   Anthony Craig  MRN: [id number] DOB: [birth year]  Subjective:  HPI   1. Type 2 diabetes mellitus with complication, with long-term current use of insulin Anthony Craig) The patient is an 81 year old male who presents for follow up of his diabetes.  The patient states he is concerned that the A1C will be elevated today.  He said that his blood sugars at home have been running between 140-170's.  He also states that his readings in the morning are the ones that are the worst.    2. Dizzy The patient has also been having episodes of dizziness.  He states they are mostly in the mornings before he ever gets out of bed.  He doesn't describe the room spinning but does feel sometimes like he is falling.  He feels is sometimes when he is in bed on his back and goes to roll over.    The patient had been started on Primedone by the [place] about 1 month ago.  He has stopped taking it to see if it might be causing the dizziness.  He has been off of it for about 2 weeks and does only sees , maybe minimal improvement.    Patient Active Problem List   Diagnosis Date Noted  . Chest pain 04/27/2015  . Allergic drug reaction 02/08/2015  . 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 B12 deficiency 02/08/2015  . 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 Chest discomfort 02/08/2015  . Diabetes ([place]) 02/08/2015  . Dizziness 02/08/2015  . Benign essential tremor 02/08/2015  . Fatigue 02/08/2015  . Acid reflux 02/08/2015  . HLD (hyperlipidemia) 02/08/2015  . BP (high blood pressure) 02/08/2015  . Hyperthyroidism 02/08/2015  . Arterial blood pressure decreased 02/08/2015  . Hernia, inguinal, left 02/08/2015  . Cannot sleep 02/08/2015  . Cancer of pancreas ([place]) 02/08/2015  . Pancreatitis 02/08/2015  . CA of prostate ([place]) 02/08/2015    Past Medical History  Diagnosis Date  .  Hypertension   . Coronary artery disease   . Cancer     pancreatic    Social History   Social History  . Marital Status: Married    Spouse Name: N/A  . Number of Children: N/A  . Years of Education: N/A   Occupational History  . Not on file.   Social History Main Topics  . Smoking status: Former Smoker -- 0.50 packs/day for 30 years    Quit date: 10/06/1979  . Smokeless tobacco: Not on file  . Alcohol Use: 1.8 oz/week    3 Cans of beer per week     Comment: Previously heavy drinker  . Drug Use: Not on file  . Sexual Activity: Not on file   Other Topics Concern  . Not on file   Social History Narrative    Outpatient Prescriptions Prior to Visit  Medication Sig Dispense Refill  . aspirin EC 325 MG tablet Take 325 mg by mouth daily as needed (for chest pain).    Marland Kitchen atorvastatin (LIPITOR) 40 MG tablet Take 1 tablet (40 mg total) by mouth at bedtime. 90 tablet 3  . calcium-vitamin D 250-100 MG-UNIT per tablet Take 1 tablet by mouth 2 (two) times daily.    . cholecalciferol (VITAMIN D) 1000 UNITS tablet Take 1,000 Units by mouth daily.    . Coenzyme Q10 (COQ10) 200 MG CAPS Take 1 capsule  by mouth daily.    . diphenoxylate-atropine (LOMOTIL) 2.5-0.025 MG per tablet Take 1 tablet by mouth 4 (four) times daily as needed for diarrhea or loose stools.    . fluticasone (FLONASE) 50 MCG/ACT nasal spray Place 2 sprays into both nostrils daily. 16 g 12  . insulin aspart (NOVOLOG) 100 UNIT/ML injection Inject 4 Units into the skin 3 (three) times daily with meals as needed for high blood sugar.     . insulin detemir (LEVEMIR) 100 UNIT/ML injection Inject 14 Units into the skin at bedtime.    . lipase/protease/amylase (CREON) 12000 UNITS CPEP capsule Take 1 capsule (12,000 Units total) by mouth 4 (four) times daily. 270 capsule 12  . Multiple Vitamins-Minerals (MULTIVITAMIN PO) Take 1 tablet by mouth daily.    Marland Kitchen omeprazole (PRILOSEC) 20 MG capsule Take 20 mg by mouth 2 (two) times daily.     . simethicone (MYLICON) 0000000 MG chewable tablet Chew 125 mg by mouth every 6 (six) hours as needed for flatulence.    . vitamin B-12 (CYANOCOBALAMIN) 1000 MCG tablet Take 1,000 mcg by mouth daily.    . diphenhydrAMINE (BENADRYL) 25 MG tablet Take 25 mg by mouth at bedtime as needed for sleep.    . Pancrelipase, Lip-Prot-Amyl, 10000 UNITS CPEP Take 3 capsules by mouth 3 (three) times daily with meals.     No facility-administered medications prior to visit.    Outpatient Encounter Prescriptions as of 09/18/2015  Medication Sig  . aspirin EC 325 MG tablet Take 325 mg by mouth daily as needed (for chest pain).  Marland Kitchen atorvastatin (LIPITOR) 40 MG tablet Take 1 tablet (40 mg total) by mouth at bedtime.  . calcium-vitamin D 250-100 MG-UNIT per tablet Take 1 tablet by mouth 2 (two) times daily.  . cholecalciferol (VITAMIN D) 1000 UNITS tablet Take 1,000 Units by mouth daily.  . Coenzyme Q10 (COQ10) 200 MG CAPS Take 1 capsule by mouth daily.  . diphenoxylate-atropine (LOMOTIL) 2.5-0.025 MG per tablet Take 1 tablet by mouth 4 (four) times daily as needed for diarrhea or loose stools.  . fluticasone (FLONASE) 50 MCG/ACT nasal spray Place 2 sprays into both nostrils daily.  . insulin aspart (NOVOLOG) 100 UNIT/ML injection Inject 4 Units into the skin 3 (three) times daily with meals as needed for high blood sugar.   . insulin detemir (LEVEMIR) 100 UNIT/ML injection Inject 14 Units into the skin at bedtime.  . lipase/protease/amylase (CREON) 12000 UNITS CPEP capsule Take 1 capsule (12,000 Units total) by mouth 4 (four) times daily.  . Multiple Vitamins-Minerals (MULTIVITAMIN PO) Take 1 tablet by mouth daily.  Marland Kitchen omeprazole (PRILOSEC) 20 MG capsule Take 20 mg by mouth 2 (two) times daily.  . simethicone (MYLICON) 0000000 MG chewable tablet Chew 125 mg by mouth every 6 (six) hours as needed for flatulence.  . vitamin B-12 (CYANOCOBALAMIN) 1000 MCG tablet Take 1,000 mcg by mouth daily.  . [DISCONTINUED]  diphenhydrAMINE (BENADRYL) 25 MG tablet Take 25 mg by mouth at bedtime as needed for sleep.  . [DISCONTINUED] Pancrelipase, Lip-Prot-Amyl, 10000 UNITS CPEP Take 3 capsules by mouth 3 (three) times daily with meals.   No facility-administered encounter medications on file as of 09/18/2015.     Allergies  Allergen Reactions  . Doxycycline Other (See Comments)    Reaction:  Unknown  Pt refuses any allergy reaction  . Hydrocodone-Homatropine Other (See Comments)    Reaction:  Unknown  Pt refuses any allergy reaction    Review of Systems  Constitutional: Negative for  fever, chills, malaise/fatigue and diaphoresis.  Eyes: Negative.   Respiratory: Negative for cough, shortness of breath and wheezing.   Cardiovascular: Negative for chest pain, palpitations, orthopnea and leg swelling.  Gastrointestinal: Negative.   Genitourinary: Negative.   Neurological: Positive for dizziness. Negative for weakness and headaches.  Psychiatric/Behavioral: Negative.    Objective:  BP 138/72 mmHg  Pulse 72  Temp(Src) 97.5 F (36.4 C) (Oral)  Resp 14  Wt 147 lb (66.679 kg)  Physical Exam  Constitutional: He is oriented to person, place, and time and well-developed, well-nourished, and in no distress.  HENT:  Head: Normocephalic and atraumatic.  Right Ear: External ear normal.  Left Ear: External ear normal.  Nose: Nose normal.  Eyes: Conjunctivae are normal.  Neck: Neck supple.  Cardiovascular: Normal rate, regular rhythm and normal heart sounds.   Pulmonary/Chest: Effort normal and breath sounds normal.  Abdominal: Soft.  Neurological: He is alert and oriented to person, place, and time.  Skin: Skin is warm.  Psychiatric: Mood, memory, affect and judgment normal.    Assessment and Plan :  Type 2 diabetes mellitus with complication, with long-term current use of insulin ([place])  Dizzy Appears to be some orthostasis involved. Discussed staying hydrated and monitoring blood pressure. No  significant orthostasis today. No syncope or presyncope involved. Craig mild vertigo. CAD All risk factors treated Pancreatic cancer  I have done the exam and reviewed the above chart and it is accurate to the best of my knowledge.  I have done the exam and reviewed the above chart and it is accurate to the best of my knowledge.  Miguel Aschoff MD [facility] 09/18/2015 2:33 PM

## 2015-10-10 ENCOUNTER — Other Ambulatory Visit: Payer: Self-pay | Admitting: Family Medicine

## 2015-10-29 ENCOUNTER — Telehealth: Payer: Self-pay | Admitting: Family Medicine

## 2015-10-29 NOTE — Telephone Encounter (Signed)
Pt would like to speak with Jiles Garter about his wife. DOB: [birth year] Anthony Craig. Pt stated this after I had started message on his chart. Pt would like a call with in the next hour. I advised him that I couldn't promise him that and he requested Jiles Garter call him on his cell ASAP to discuss his concerns without his wife present. Thanks TNP

## 2015-10-29 NOTE — Telephone Encounter (Signed)
Appointment made for his wife

## 2015-11-08 LAB — LIPID PANEL
Cholesterol: 113 mg/dL (ref 0–200)
HDL: 36 mg/dL (ref 35–70)
LDL CALC: 54 mg/dL
TRIGLYCERIDES: 116 mg/dL (ref 40–160)

## 2015-11-08 LAB — HEMOGLOBIN A1C: Hemoglobin A1C: 9.2

## 2015-11-28 ENCOUNTER — Ambulatory Visit (INDEPENDENT_AMBULATORY_CARE_PROVIDER_SITE_OTHER): Payer: Commercial Managed Care - HMO | Admitting: Family Medicine

## 2015-11-28 VITALS — BP 124/60 | HR 62 | Temp 97.7°F | Resp 18 | Wt 146.0 lb

## 2015-11-28 DIAGNOSIS — R42 Dizziness and giddiness: Secondary | ICD-10-CM

## 2015-11-28 DIAGNOSIS — E785 Hyperlipidemia, unspecified: Secondary | ICD-10-CM

## 2015-11-28 DIAGNOSIS — E118 Type 2 diabetes mellitus with unspecified complications: Secondary | ICD-10-CM

## 2015-11-28 DIAGNOSIS — I10 Essential (primary) hypertension: Secondary | ICD-10-CM

## 2015-11-28 DIAGNOSIS — Z794 Long term (current) use of insulin: Secondary | ICD-10-CM

## 2015-11-28 NOTE — Progress Notes (Signed)
Patient ID: Anthony Craig, male   DOB: 12/15/1927, 82 y.o.   MRN: [id number]    Subjective:  HPI  Patient is here for follow up. His last visit was in December 2016. He was having dizziness-note stated it appeared to be orthostasis. Patient states the sensation has mostly resolved. He feels better. He states he does get unsteady at times but nothing unusual just says with his age not surprising. No changes to medications have been made.  Prior to Admission medications   Medication Sig Start Date End Date Taking? Authorizing Provider  Alcohol Swabs (B-D SINGLE USE SWABS REGULAR) PADS  10/29/15  Yes Historical Provider, MD  aspirin EC 325 MG tablet Take 325 mg by mouth daily as needed (for chest pain).   Yes Historical Provider, MD  atorvastatin (LIPITOR) 40 MG tablet Take 1 tablet (40 mg total) by mouth at bedtime. 05/17/15  Yes Richard Maceo Pro., MD  calcium-vitamin D 250-100 MG-UNIT per tablet Take 1 tablet by mouth 2 (two) times daily.   Yes Historical Provider, MD  cholecalciferol (VITAMIN D) 1000 UNITS tablet Take 1,000 Units by mouth daily.   Yes Historical Provider, MD  Coenzyme Q10 (COQ10) 200 MG CAPS Take 1 capsule by mouth daily.   Yes Historical Provider, MD  diphenoxylate-atropine (LOMOTIL) 2.5-0.025 MG per tablet Take 1 tablet by mouth 4 (four) times daily as needed for diarrhea or loose stools.   Yes Historical Provider, MD  fluticasone (FLONASE) 50 MCG/ACT nasal spray Place 2 sprays into both nostrils daily. 05/28/15  Yes Richard Maceo Pro., MD  insulin aspart (NOVOLOG) 100 UNIT/ML injection Inject 4 Units into the skin 3 (three) times daily with meals as needed for high blood sugar.    Yes Historical Provider, MD  insulin detemir (LEVEMIR) 100 UNIT/ML injection Inject 14 Units into the skin at bedtime.   Yes Historical Provider, MD  lipase/protease/amylase (CREON) 12000 UNITS CPEP capsule Take 1 capsule (12,000 Units total) by mouth 4 (four) times daily. 06/15/15  Yes  Richard Maceo Pro., MD  Multiple Vitamins-Minerals (MULTIVITAMIN PO) Take 1 tablet by mouth daily.   Yes Historical Provider, MD  omeprazole (PRILOSEC) 20 MG capsule TAKE 1 CAPSULE TWICE DAILY 10/10/15  Yes Richard Maceo Pro., MD  simethicone (MYLICON) 0000000 MG chewable tablet Chew 125 mg by mouth every 6 (six) hours as needed for flatulence.   Yes Historical Provider, MD  vitamin B-12 (CYANOCOBALAMIN) 1000 MCG tablet Take 1,000 mcg by mouth daily.   Yes Historical Provider, MD    Patient Active Problem List   Diagnosis Date Noted  . Chest pain 04/27/2015  . Allergic drug reaction 02/08/2015  . 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 B12 deficiency 02/08/2015  . 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 Chest discomfort 02/08/2015  . Diabetes ([place]) 02/08/2015  . Dizziness 02/08/2015  . Benign essential tremor 02/08/2015  . Fatigue 02/08/2015  . Acid reflux 02/08/2015  . HLD (hyperlipidemia) 02/08/2015  . BP (high blood pressure) 02/08/2015  . Hyperthyroidism 02/08/2015  . Arterial blood pressure decreased 02/08/2015  . Hernia, inguinal, left 02/08/2015  . Cannot sleep 02/08/2015  . Cancer of pancreas ([place]) 02/08/2015  . Pancreatitis 02/08/2015  . CA of prostate ([place]) 02/08/2015    Past Medical History  Diagnosis Date  . Hypertension   . Coronary artery disease   . Cancer     pancreatic    Social History   Social History  . Marital  Status: Married    Spouse Name: N/A  . Number of Children: N/A  . Years of Education: N/A   Occupational History  . Not on file.   Social History Main Topics  . Smoking status: Former Smoker -- 0.50 packs/day for 30 years    Quit date: 10/06/1979  . Smokeless tobacco: Not on file  . Alcohol Use: 1.8 oz/week    3 Cans of beer per week     Comment: Previously heavy drinker  . Drug Use: Not on file  . Sexual Activity: Not on file   Other Topics Concern  . Not on file   Social History  Narrative    Allergies  Allergen Reactions  . Doxycycline Other (See Comments)    Reaction:  Unknown  Pt refuses any allergy reaction  . Hydrocodone-Homatropine Other (See Comments)    Reaction:  Unknown  Pt refuses any allergy reaction    Review of Systems  Constitutional: Negative.   Respiratory: Negative.   Cardiovascular: Negative.   Musculoskeletal: Negative.     Immunization History  Administered Date(s) Administered  . Influenza-Unspecified 07/09/2015  . Pneumococcal Conjugate-13 11/14/2013  . Pneumococcal Polysaccharide-23 06/08/2010  . Td 06/08/2006   Objective:  BP 124/60 mmHg  Pulse 62  Temp(Src) 97.7 F (36.5 C)  Resp 18  Wt 146 lb (66.225 kg)  Physical Exam  Constitutional: He is oriented to person, place, and time and well-developed, well-nourished, and in no distress.  HENT:  Head: Normocephalic and atraumatic.  Eyes: Conjunctivae are normal. Pupils are equal, round, and reactive to light.  Neck: Normal range of motion. Neck supple.  Cardiovascular: Normal rate, regular rhythm, normal heart sounds and intact distal pulses.   No murmur heard. Pulmonary/Chest: Effort normal and breath sounds normal. No respiratory distress. He has no wheezes.  Musculoskeletal: He exhibits no edema or tenderness.  Neurological: He is alert and oriented to person, place, and time.  Psychiatric: Mood, memory, affect and judgment normal.    Lab Results  Component Value Date   WBC 4.9 04/27/2015   HGB 13.0 04/27/2015   HCT 38.6* 04/27/2015   PLT 153 04/27/2015   GLUCOSE 218* 04/27/2015   CHOL 117 09/05/2014   TRIG 72 09/05/2014   HDL 44 09/05/2014   LDLCALC 59 09/05/2014   TSH 0.711 03/15/2015   HGBA1C 7.5 09/18/2015    CMP     Component Value Date/Time   NA 137 04/27/2015 1859   NA 140 03/15/2015 1620   NA 140 01/17/2014 0413   K 4.2 04/27/2015 1859   K 3.8 01/17/2014 0413   CL 103 04/27/2015 1859   CL 107 01/17/2014 0413   CO2 27 04/27/2015 1859    CO2 27 01/17/2014 0413   GLUCOSE 218* 04/27/2015 1859   GLUCOSE 139* 03/15/2015 1620   GLUCOSE 125* 01/17/2014 0413   BUN 19 04/27/2015 1859   BUN 17 03/15/2015 1620   BUN 16 01/17/2014 0413   CREATININE 0.96 04/27/2015 1859   CREATININE 1.0 09/05/2014   CREATININE 1.10 01/17/2014 0413   CALCIUM 9.1 04/27/2015 1859   CALCIUM 8.0* 01/17/2014 0413   PROT 6.9 03/15/2015 1620   PROT 7.4 01/16/2014 0201   ALBUMIN 4.7 03/15/2015 1620   ALBUMIN 4.0 01/16/2014 0201   AST 25 03/15/2015 1620   AST 151* 01/16/2014 0201   ALT 27 03/15/2015 1620   ALT 100* 01/16/2014 0201   ALKPHOS 74 03/15/2015 1620   ALKPHOS 88 01/16/2014 0201   BILITOT 0.4 03/15/2015 1620  BILITOT 0.5 01/16/2014 0201   GFRNONAA >60 04/27/2015 1859   GFRNONAA >60 01/17/2014 0413   GFRAA >60 04/27/2015 1859   GFRAA >60 01/17/2014 0413    Assessment and Plan :  1. Dizzy Resolved. Follow as needed.most likely this was orthostasis.  2. Type 2 diabetes mellitus with complication, with long-term current use of insulin ([place]) Patient has seen a doctor through [place] and they made some changes with his insulin regimen.  3. HLD (hyperlipidemia) Advised patient to bring by a copy of his recent labs through [place]. Patient understood and will bring it by. 4. CAD/status post CABG All risk factors treated 5. Pancreatic cancer/status post successful Whipple procedure I have done the exam and reviewed the above chart and it is accurate to the best of my knowledge.   Patient was seen and examined by Dr. Eulas Post and note was scribed by Theressa Millard, RMA.    Miguel Aschoff MD [facility] 11/28/2015 1:39 PM

## 2015-12-24 ENCOUNTER — Encounter: Payer: Self-pay | Admitting: Family Medicine

## 2015-12-24 ENCOUNTER — Other Ambulatory Visit: Payer: Self-pay | Admitting: Family Medicine

## 2016-01-21 ENCOUNTER — Other Ambulatory Visit: Payer: Self-pay | Admitting: Emergency Medicine

## 2016-01-21 ENCOUNTER — Telehealth: Payer: Self-pay | Admitting: Emergency Medicine

## 2016-01-21 MED ORDER — INSULIN ASPART 100 UNIT/ML FLEXPEN: 4.0000 [IU] | PEN_INJECTOR | Freq: Three times a day (TID) | SUBCUTANEOUS | Status: DC

## 2016-01-21 NOTE — Telephone Encounter (Signed)
Pt would like for Novolog pens be [facility] in. [facility]. His endocrinologist there recommends that he take 4 units with breakfast and lunch, then 2 units with supper and reduce his lantus to 10 units daily. Please advise.  [facility] mail order.

## 2016-01-21 NOTE — Telephone Encounter (Signed)
Med sent. Pt wife informed.

## 2016-01-21 NOTE — Telephone Encounter (Signed)
ok 

## 2016-02-14 ENCOUNTER — Other Ambulatory Visit: Payer: Self-pay | Admitting: Family Medicine

## 2016-02-21 ENCOUNTER — Other Ambulatory Visit: Payer: Self-pay | Admitting: Family Medicine

## 2016-03-24 ENCOUNTER — Ambulatory Visit: Payer: Commercial Managed Care - HMO | Admitting: Family Medicine

## 2016-04-18 ENCOUNTER — Encounter: Payer: Self-pay | Admitting: Family Medicine

## 2016-04-18 ENCOUNTER — Ambulatory Visit (INDEPENDENT_AMBULATORY_CARE_PROVIDER_SITE_OTHER): Payer: Commercial Managed Care - HMO | Admitting: Family Medicine

## 2016-04-18 VITALS — BP 106/62 | HR 60 | Temp 97.9°F | Resp 16 | Wt 149.0 lb

## 2016-04-18 DIAGNOSIS — R42 Dizziness and giddiness: Secondary | ICD-10-CM | POA: Diagnosis not present

## 2016-04-18 DIAGNOSIS — E118 Type 2 diabetes mellitus with unspecified complications: Secondary | ICD-10-CM

## 2016-04-18 NOTE — Progress Notes (Signed)
Patient: Anthony Craig Male    DOB: January 05, 1928   82 y.o.   MRN: [id number] Visit Date: 04/18/2016  Today's Provider: Vernie Murders, PA   Chief Complaint  Patient presents with  . Dizziness    X 3 days.    Subjective:    HPI Patient reports that he was out playing golf 2 days ago, and when he came home he had a lot of dizziness. Patient reports that it comes and goes, and he has the sensation of the room spinning along with fogginess in his head.   Past Medical History  Diagnosis Date  . Hypertension   . Coronary artery disease   . Cancer [facility])     pancreatic   Past Surgical History  Procedure Laterality Date  . Hernia repair Bilateral     2016-left, 2009-right  . Whipple procedure  2007  . Coronary artery bypass graft      2001-3 vessel  . Appendectomy      1977   Family History  Problem Relation Age of Onset  . Heart disease Mother   . Cancer Mother 5    breast  . Heart disease Father   . COPD Brother   . Stroke Brother   . Diabetes Other     1 and 2   Allergies  Allergen Reactions  . Doxycycline Other (See Comments)    Reaction:  Unknown  Pt refuses any allergy reaction  . Hydrocodone-Homatropine Other (See Comments)    Reaction:  Unknown  Pt refuses any allergy reaction   Current Meds  Medication Sig  . ACCU-CHEK SMARTVIEW test strip CHECK SUGAR THREE TIMES DAILY  . Alcohol Swabs (B-D SINGLE USE SWABS REGULAR) PADS USE AS DIRECTED  WITH  EACH  FINGERSTICK  . aspirin EC 325 MG tablet Take 325 mg by mouth daily as needed (for chest pain).  Marland Kitchen atorvastatin (LIPITOR) 40 MG tablet TAKE 1 TABLET (40 MG TOTAL) BY MOUTH AT BEDTIME.  . calcium-vitamin D 250-100 MG-UNIT per tablet Take 1 tablet by mouth 2 (two) times daily.  . cholecalciferol (VITAMIN D) 1000 UNITS tablet Take 1,000 Units by mouth daily.  . Coenzyme Q10 (COQ10) 200 MG CAPS Take 1 capsule by mouth daily.  . diphenoxylate-atropine (LOMOTIL) 2.5-0.025 MG per tablet Take 1 tablet by  mouth 4 (four) times daily as needed for diarrhea or loose stools.  . fluticasone (FLONASE) 50 MCG/ACT nasal spray Place 2 sprays into both nostrils daily.  . insulin aspart (NOVOLOG FLEXPEN) 100 UNIT/ML FlexPen Inject 4 Units into the skin 3 (three) times daily with meals. 90 day supply. Generic ok.  . insulin detemir (LEVEMIR) 100 UNIT/ML injection Inject 14 Units into the skin at bedtime.  . lipase/protease/amylase (CREON) 12000 UNITS CPEP capsule Take 1 capsule (12,000 Units total) by mouth 4 (four) times daily.  . Multiple Vitamins-Minerals (MULTIVITAMIN PO) Take 1 tablet by mouth daily.  Marland Kitchen omeprazole (PRILOSEC) 20 MG capsule TAKE 1 CAPSULE TWICE DAILY  . simethicone (MYLICON) 0000000 MG chewable tablet Chew 125 mg by mouth every 6 (six) hours as needed for flatulence.  . vitamin B-12 (CYANOCOBALAMIN) 1000 MCG tablet Take 1,000 mcg by mouth daily.    Review of Systems  Constitutional: Negative.   Neurological: Positive for dizziness and headaches.  Psychiatric/Behavioral: Negative.     Social History  Substance Use Topics  . Smoking status: Former Smoker -- 0.50 packs/day for 30 years    Quit date: 10/06/1979  . Smokeless tobacco:  Not on file  . Alcohol Use: 1.8 oz/week    3 Cans of beer per week     Comment: Previously heavy drinker   Objective:   BP 106/62 mmHg  Pulse 60  Temp(Src) 97.9 F (36.6 C)  Resp 16  Wt 149 lb (67.586 kg) Body mass index is 21.7 kg/(m^2).  BP Readings from Last 3 Encounters:  04/18/16 106/62  11/28/15 124/60  09/18/15 138/72   Wt Readings from Last 3 Encounters:  04/18/16 149 lb (67.586 kg)  11/28/15 146 lb (66.225 kg)  09/18/15 147 lb (66.679 kg)    Physical Exam  Constitutional: He is oriented to person, place, and time. He appears well-developed and well-nourished.  HENT:  Head: Normocephalic.  Right Ear: External ear normal.  Left Ear: External ear normal.  Nose: Nose normal.  Mouth/Throat: Oropharynx is clear and moist.  Eyes:  Conjunctivae and EOM are normal.  Neck: Neck supple.  Cardiovascular: Normal rate and regular rhythm.   Pulmonary/Chest: Breath sounds normal.  Abdominal: Bowel sounds are normal.  Lymphadenopathy:    He has no cervical adenopathy.  Neurological: He is alert and oriented to person, place, and time.  Psychiatric: He has a normal mood and affect. His behavior is normal.   Assessment & Plan:     1. Dizziness, nonspecific Intermittent spinning sensation for a few seconds since playing golf in the hot weather 2 days ago. No syncope. Suspect dehydration, near heat exhaustion or orthostasis. Will check labs and encouraged to increase fluid intake. May use Bonine prn. Recheck pending lab reports. - CBC with Differential/Platelet - Comprehensive metabolic panel  2. Type 2 diabetes mellitus with complication, without long-term current use of insulin ([facility]) Hgb A1C at last OV was 9.2. Still taking Levemir 60 units q PM with Novolog 4 units TID with meals. Will recheck labs and advised to monitor blood sugar for hypoglycemia. Denies any episodes recently. - Comprehensive metabolic panel - Hemoglobin A1c       Vernie Murders, PA  [facility]

## 2016-04-19 LAB — COMPREHENSIVE METABOLIC PANEL
ALBUMIN: 4.5 g/dL (ref 3.5–4.7)
ALT: 30 IU/L (ref 0–44)
AST: 24 IU/L (ref 0–40)
Albumin/Globulin Ratio: 2.3 — ABNORMAL HIGH (ref 1.2–2.2)
Alkaline Phosphatase: 71 IU/L (ref 39–117)
BUN / CREAT RATIO: 16 (ref 10–24)
BUN: 19 mg/dL (ref 8–27)
Bilirubin Total: 0.4 mg/dL (ref 0.0–1.2)
CALCIUM: 9.5 mg/dL (ref 8.6–10.2)
CO2: 25 mmol/L (ref 18–29)
CREATININE: 1.2 mg/dL (ref 0.76–1.27)
Chloride: 100 mmol/L (ref 96–106)
GFR, EST AFRICAN AMERICAN: 63 mL/min/{1.73_m2} (ref >59)
GFR, EST NON AFRICAN AMERICAN: 55 mL/min/{1.73_m2} — AB (ref >59)
GLUCOSE: 125 mg/dL — AB (ref 65–99)
Globulin, Total: 2 g/dL (ref 1.5–4.5)
Potassium: 4.7 mmol/L (ref 3.5–5.2)
Sodium: 138 mmol/L (ref 134–144)
TOTAL PROTEIN: 6.5 g/dL (ref 6.0–8.5)

## 2016-04-19 LAB — CBC WITH DIFFERENTIAL/PLATELET
BASOS ABS: 0 10*3/uL (ref 0.0–0.2)
Basos: 0 %
EOS (ABSOLUTE): 0.1 10*3/uL (ref 0.0–0.4)
Eos: 2 %
HEMOGLOBIN: 13.2 g/dL (ref 12.6–17.7)
Hematocrit: 39.7 % (ref 37.5–51.0)
IMMATURE GRANS (ABS): 0 10*3/uL (ref 0.0–0.1)
IMMATURE GRANULOCYTES: 0 %
LYMPHS: 21 %
Lymphocytes Absolute: 1.1 10*3/uL (ref 0.7–3.1)
MCH: 27.6 pg (ref 26.6–33.0)
MCHC: 33.2 g/dL (ref 31.5–35.7)
MCV: 83 fL (ref 79–97)
MONOCYTES: 5 %
Monocytes Absolute: 0.3 10*3/uL (ref 0.1–0.9)
NEUTROS ABS: 3.8 10*3/uL (ref 1.4–7.0)
Neutrophils: 72 %
Platelets: 203 10*3/uL (ref 150–379)
RBC: 4.78 x10E6/uL (ref 4.14–5.80)
RDW: 14.4 % (ref 12.3–15.4)
WBC: 5.3 10*3/uL (ref 3.4–10.8)

## 2016-04-19 LAB — HEMOGLOBIN A1C
Est. average glucose Bld gHb Est-mCnc: 160 mg/dL
Hgb A1c MFr Bld: 7.2 % — ABNORMAL HIGH (ref 4.8–5.6)

## 2016-04-21 ENCOUNTER — Telehealth: Payer: Self-pay

## 2016-04-21 NOTE — Telephone Encounter (Signed)
-----   Message from Margo Common, [place] sent at 04/20/2016 11:33 PM EDT ----- No signs of infection or anemia. Liver function normal. Mild changes in kidney function indicates need for extra fluid intake. Blood sugar in good shape and Hgb A1C much better than 5 months ago. Continue present diabetes regimen and keep appointment with Dr. Rosanna Randy 05-29-16. May use Meclizine (Bonine) prn dizziness or nausea. Recheck sooner if needed.

## 2016-04-21 NOTE — Telephone Encounter (Signed)
Pt advised.   Thanks,   -Laura  

## 2016-04-24 ENCOUNTER — Other Ambulatory Visit: Payer: Self-pay | Admitting: Family Medicine

## 2016-04-24 MED ORDER — INSULIN DETEMIR 100 UNIT/ML ~~LOC~~ SOLN: 60.0000 [IU] | Freq: Every day | SUBCUTANEOUS | Status: DC

## 2016-04-24 NOTE — Telephone Encounter (Signed)
Patient needs a refill on insulin detemir (LEVEMIR) 100 UNIT/ML injection.  Patient's wife brought box in and it says inject 60 units subcutaneously at bedtime.  He uses [employer] [facility].

## 2016-04-24 NOTE — Telephone Encounter (Signed)
Done-aa 

## 2016-04-25 ENCOUNTER — Other Ambulatory Visit: Payer: Self-pay

## 2016-04-25 MED ORDER — INSULIN DETEMIR 100 UNIT/ML FLEXPEN: 60.0000 [IU] | PEN_INJECTOR | Freq: Every day | SUBCUTANEOUS | Status: DC

## 2016-04-28 ENCOUNTER — Other Ambulatory Visit: Payer: Self-pay | Admitting: Family Medicine

## 2016-05-29 ENCOUNTER — Encounter: Payer: Self-pay | Admitting: Family Medicine

## 2016-05-29 ENCOUNTER — Ambulatory Visit (INDEPENDENT_AMBULATORY_CARE_PROVIDER_SITE_OTHER): Payer: Commercial Managed Care - HMO | Admitting: Family Medicine

## 2016-05-29 VITALS — BP 128/60 | HR 68 | Temp 98.6°F | Resp 16 | Wt 148.0 lb

## 2016-05-29 DIAGNOSIS — Z0000 Encounter for general adult medical examination without abnormal findings: Secondary | ICD-10-CM

## 2016-05-29 DIAGNOSIS — R42 Dizziness and giddiness: Secondary | ICD-10-CM | POA: Diagnosis not present

## 2016-05-29 NOTE — Progress Notes (Signed)
Patient: Anthony Craig, Male    DOB: [birth year], 82 y.o.   MRN: [id number] Visit Date: 05/29/2016  Today's Provider: Wilhemena Durie, MD   Chief Complaint  Patient presents with  . Annual Wellness Exam   Subjective:    Annual wellness visit Anthony Craig is a 82 y.o. male. He feels well. He reports exercising everyday. He plays golf.  He reports he is sleeping well. Overall the patient is doing well. He wants to gain some weight but is unable to do so. His only issue this year as been occasional dizziness. It occurs when he moves his head from side to side quickly area and it also can occur when he stands up quickly. He has no syncope, no chest pain, no neurologic symptoms otherwise. -----------------------------------------------------------   Review of Systems  Constitutional: Negative.   HENT: Positive for hearing loss and tinnitus. Negative for congestion, dental problem, drooling, ear discharge, ear pain, facial swelling, mouth sores, nosebleeds, postnasal drip, rhinorrhea, sinus pressure, sneezing, sore throat, trouble swallowing and voice change.   Eyes: Negative.   Respiratory: Negative.   Cardiovascular: Negative.   Gastrointestinal: Negative.   Endocrine: Negative.   Genitourinary: Negative.   Musculoskeletal: Negative.   Skin: Negative.   Neurological: Positive for dizziness and light-headedness. Negative for tremors, seizures, syncope, facial asymmetry, speech difficulty, weakness, numbness and headaches.  Hematological: Negative.   Psychiatric/Behavioral: Negative.     Social History   Social History  . Marital status: Married    Spouse name: N/A  . Number of children: N/A  . Years of education: N/A   Occupational History  . Not on file.   Social History Main Topics  . Smoking status: Former Smoker    Packs/day: 0.50    Years: 30.00    Quit date: 10/06/1979  . Smokeless tobacco: Not on file  . Alcohol use 1.8 oz/week    3 Cans  of beer per week     Comment: Previously heavy drinker  . Drug use: Unknown  . Sexual activity: Not on file   Other Topics Concern  . Not on file   Social History Narrative  . No narrative on file    Past Medical History:  Diagnosis Date  . Cancer [facility])    pancreatic  . Coronary artery disease   . Hypertension      Patient Active Problem List   Diagnosis Date Noted  . Chest pain 04/27/2015  . Allergic drug reaction 02/08/2015  . 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 B12 deficiency 02/08/2015  . 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 Chest discomfort 02/08/2015  . Diabetes ([place]) 02/08/2015  . Dizziness 02/08/2015  . Benign essential tremor 02/08/2015  . Fatigue 02/08/2015  . Acid reflux 02/08/2015  . HLD (hyperlipidemia) 02/08/2015  . BP (high blood pressure) 02/08/2015  . Hyperthyroidism 02/08/2015  . Arterial blood pressure decreased 02/08/2015  . Hernia, inguinal, left 02/08/2015  . Cannot sleep 02/08/2015  . Cancer of pancreas ([place]) 02/08/2015  . Pancreatitis 02/08/2015  . CA of prostate ([place]) 02/08/2015    Past Surgical History:  Procedure Laterality Date  . APPENDECTOMY     1977  . CORONARY ARTERY BYPASS GRAFT     2001-3 vessel  . HERNIA REPAIR Bilateral    2016-left, 2009-right  . WHIPPLE PROCEDURE  2007    His family history includes COPD in his brother; Cancer (age of onset: 19) in his mother;  Diabetes in his other; Heart disease in his father and mother; Stroke in his brother.    Current Meds  Medication Sig  . ACCU-CHEK SMARTVIEW test strip CHECK SUGAR THREE TIMES DAILY  . Alcohol Swabs (B-D SINGLE USE SWABS REGULAR) PADS USE AS DIRECTED  WITH  EACH  FINGERSTICK  . aspirin EC 325 MG tablet Take 325 mg by mouth daily as needed (for chest pain).  Marland Kitchen atorvastatin (LIPITOR) 40 MG tablet TAKE 1 TABLET (40 MG TOTAL) BY MOUTH AT BEDTIME.  . calcium-vitamin D 250-100 MG-UNIT per tablet Take 1 tablet by  mouth 2 (two) times daily.  . cholecalciferol (VITAMIN D) 1000 UNITS tablet Take 1,000 Units by mouth daily.  . Coenzyme Q10 (COQ10) 200 MG CAPS Take 1 capsule by mouth daily.  . diphenoxylate-atropine (LOMOTIL) 2.5-0.025 MG per tablet Take 1 tablet by mouth 4 (four) times daily as needed for diarrhea or loose stools.  . fluticasone (FLONASE) 50 MCG/ACT nasal spray Place 2 sprays into both nostrils daily.  . insulin aspart (NOVOLOG FLEXPEN) 100 UNIT/ML FlexPen Inject 4 Units into the skin 3 (three) times daily with meals. 90 day supply. Generic ok.  . Insulin Detemir (LEVEMIR FLEXTOUCH) 100 UNIT/ML Pen Inject 60 Units into the skin daily at 10 pm.    Patient Care Team: Jerrol Banana., MD as PCP - General (Family Medicine)    Objective:   Vitals: BP 128/60   Pulse 68   Temp 98.6 F (37 C)   Resp 16   Wt 148 lb (67.1 kg)   BMI 21.54 kg/m   Physical Exam  Constitutional: He is oriented to person, place, and time. He appears well-developed and well-nourished.  HENT:  Head: Normocephalic and atraumatic.  Right Ear: External ear normal.  Left Ear: External ear normal.  Nose: Nose normal.  Mouth/Throat: Oropharynx is clear and moist.  Eyes: Conjunctivae are normal. No scleral icterus.  Neck: Neck supple. No JVD present. No thyromegaly present.  Cardiovascular: Normal rate, regular rhythm, normal heart sounds and intact distal pulses.   Supine blood pressure 120/60, heart rate 66. Sitting 130/60, heart rate 68 Standing blood pressure 132/58, heart rate 65   Pulmonary/Chest: Effort normal. No respiratory distress.  Abdominal: Soft. He exhibits no mass. There is no tenderness.  Genitourinary: Rectum normal, prostate normal and penis normal. Rectal exam shows guaiac negative stool.  Musculoskeletal: He exhibits no edema.  Lymphadenopathy:    He has no cervical adenopathy.  Neurological: He is alert and oriented to person, place, and time. No cranial nerve deficit. He exhibits  normal muscle tone. Coordination normal.  Mild bilateral nystagmus noted.  Skin: Skin is warm and dry.  Psychiatric: He has a normal mood and affect. His behavior is normal. Judgment and thought content normal.    Activities of Daily Living In your present state of health, do you have any difficulty performing the following activities: 05/29/2016  Hearing? Y  Vision? Y  Difficulty concentrating or making decisions? Y  Walking or climbing stairs? N  Dressing or bathing? N  Doing errands, shopping? N  Some recent data might be hidden    Fall Risk Assessment Fall Risk  05/29/2016 11/28/2015  Falls in the past year? Yes No  Number falls in past yr: 1 -  Injury with Fall? No -     Depression Screen PHQ 2/9 Scores 05/29/2016 11/28/2015  PHQ - 2 Score 0 0    Cognitive Testing - 6-CIT  Correct? Score   What year  is it? yes 0 0 or 4  What month is it? yes 0 0 or 3  Memorize:    Pia Mau,  44,  [address],  [place],      What time is it? (within 1 hour) yes 0 0 or 3  Count backwards from 20 yes 0 0, 2, or 4  Name the months of the year yes 0 0, 2, or 4  Repeat name & address above yes 0 0, 2, 4, 6, 8, or 10       TOTAL SCORE  0/28   Interpretation:  Normal  Normal (0-7) Abnormal (8-28)       Assessment & Plan:     Annual Wellness Visit  Reviewed patient's Family Medical History Reviewed and updated list of patient's medical providers Assessment of cognitive impairment was done Assessed patient's functional ability Established a written schedule for health screening [employer] Completed and Reviewed  Exercise Activities and Dietary recommendations Goals    None      Immunization History  Administered Date(s) Administered  . Influenza-Unspecified 07/09/2015  . Pneumococcal Conjugate-13 11/14/2013  . Pneumococcal Polysaccharide-23 06/08/2010  . Td 06/08/2006    Health Maintenance  Topic Date Due  . OPHTHALMOLOGY EXAM  06/23/1938  . URINE  MICROALBUMIN  06/23/1940  . ZOSTAVAX  06/23/1990  . FOOT EXAM  03/14/2016  . INFLUENZA VACCINE  05/06/2016  . TETANUS/TDAP  06/08/2016  . HEMOGLOBIN A1C  10/19/2016  . PNA vac Low Risk Adult  Completed     Regular exercise recommended. Patient is active and plays golf a couple times a week. Discussed health benefits of physical activity, and encouraged him to engage in regular exercise appropriate for his age and condition.  Status post Whipple procedure for pancreatic cancer 2007 Status post CABG for CAD 2001 Status post prostatectomy for prostate cancer 1994 Status post appendectomy GERD Type 2 diabetes, diet controlled Nutrition, most likely secondary to pancreatic insufficiency/malabsorption Mild vertigo Push fluids and try over-the-counter meclizine twice a day which has helped the patient taken once a day to this point. Any further workup if he worsens. Chest pain EKG within normal limits. This is very nonspecific and I do not think this is angina. Noncardiac chest pain, likely GERD Hypertension Hyperlipidemia Check labs Patient is [facility] the [facility] for his routine medical issues. I have done the exam and reviewed the above chart and it is accurate to the best of my knowledge.     ------------------------------------------------------------------------------------------------------------    Wilhemena Durie, MD  [facility]

## 2016-09-10 ENCOUNTER — Ambulatory Visit (INDEPENDENT_AMBULATORY_CARE_PROVIDER_SITE_OTHER): Payer: Commercial Managed Care - HMO | Admitting: Family Medicine

## 2016-09-10 ENCOUNTER — Encounter: Payer: Self-pay | Admitting: Family Medicine

## 2016-09-10 VITALS — BP 142/70 | HR 74 | Temp 97.8°F | Resp 16 | Wt 148.0 lb

## 2016-09-10 DIAGNOSIS — C257 Malignant neoplasm of other parts of pancreas: Secondary | ICD-10-CM

## 2016-09-10 DIAGNOSIS — R1013 Epigastric pain: Secondary | ICD-10-CM | POA: Diagnosis not present

## 2016-09-10 DIAGNOSIS — I10 Essential (primary) hypertension: Secondary | ICD-10-CM

## 2016-09-10 MED ORDER — OMEPRAZOLE 20 MG PO CPDR: 20.0000 mg | DELAYED_RELEASE_CAPSULE | Freq: Two times a day (BID) | ORAL | 3 refills | Status: DC

## 2016-09-10 MED ORDER — HYDROCODONE-ACETAMINOPHEN 5-325 MG PO TABS: 1.0000 | ORAL_TABLET | ORAL | 0 refills | Status: DC | PRN

## 2016-09-10 NOTE — Patient Instructions (Signed)
Do not take Advil or Aleve.

## 2016-09-10 NOTE — Progress Notes (Signed)
Subjective:  HPI Pt is here today for abdominal pain. He reports that he started about 6 weeks ago with a sharp pain in the center of his stomach. He laid down and it passed. He did not think anything else of it. Then he started noticing it when he ate, but it was more of a discomfort than the same sharp pain. He reports that it went away after that and has been intermittent. Then yesterday it started back hurting and has not stopped, it has been hurting for about 24 hours. He reports that it is sometimes sharp and when it is sharp his pain level is about a 7. He has a history of pancreatitis, says this is not the same pain, that pain was much worse. He has also been burping a lot and passing gas. He take Omeprazole daily in the mornings. Denies nasuea, vomiting, diarrhea fevers, or chills.  There do not seem to be any definitive aggravating or alleviating circumstances. Prior to Admission medications   Medication Sig Start Date End Date Taking? Authorizing Provider  ACCU-CHEK SMARTVIEW test strip CHECK SUGAR THREE TIMES DAILY 04/28/16  Yes Vickki Muff Chrismon, PA  Alcohol Swabs (B-D SINGLE USE SWABS REGULAR) PADS USE AS DIRECTED  WITH  EACH  FINGERSTICK 12/24/15  Yes Jerrol Banana., MD  aspirin EC 81 MG tablet Take 81 mg by mouth daily as needed (for chest pain).    Yes Historical Provider, MD  atorvastatin (LIPITOR) 40 MG tablet TAKE 1 TABLET (40 MG TOTAL) BY MOUTH AT BEDTIME. 02/14/16  Yes Richard Maceo Pro., MD  calcium-vitamin D 250-100 MG-UNIT per tablet Take 1 tablet by mouth 2 (two) times daily.   Yes Historical Provider, MD  cholecalciferol (VITAMIN D) 1000 UNITS tablet Take 1,000 Units by mouth daily.   Yes Historical Provider, MD  Coenzyme Q10 (COQ10) 200 MG CAPS Take 1 capsule by mouth daily.   Yes Historical Provider, MD  diphenoxylate-atropine (LOMOTIL) 2.5-0.025 MG per tablet Take 1 tablet by mouth 4 (four) times daily as needed for diarrhea or loose stools.   Yes Historical  Provider, MD  insulin aspart (NOVOLOG FLEXPEN) 100 UNIT/ML FlexPen Inject 4 Units into the skin 3 (three) times daily with meals. 90 day supply. Generic ok. 01/21/16  Yes Richard Maceo Pro., MD  Insulin Detemir (LEVEMIR FLEXTOUCH) 100 UNIT/ML Pen Inject 60 Units into the skin daily at 10 pm. 04/25/16  Yes Jerrol Banana., MD  lipase/protease/amylase (CREON) 12000 UNITS CPEP capsule Take 1 capsule (12,000 Units total) by mouth 4 (four) times daily. 06/15/15  Yes Richard Maceo Pro., MD  Multiple Vitamins-Minerals (MULTIVITAMIN PO) Take 1 tablet by mouth daily.   Yes Historical Provider, MD  omeprazole (PRILOSEC) 20 MG capsule TAKE 1 CAPSULE TWICE DAILY 10/10/15  Yes Richard Maceo Pro., MD  vitamin B-12 (CYANOCOBALAMIN) 1000 MCG tablet Take 1,000 mcg by mouth daily.   Yes Historical Provider, MD  fluticasone (FLONASE) 50 MCG/ACT nasal spray Place 2 sprays into both nostrils daily. Patient not taking: Reported on 09/10/2016 05/28/15   Jerrol Banana., MD  simethicone (MYLICON) 0000000 MG chewable tablet Chew 125 mg by mouth every 6 (six) hours as needed for flatulence.    Historical Provider, MD    Patient Active Problem List   Diagnosis Date Noted  . Chest pain 04/27/2015  . Allergic drug reaction 02/08/2015  . 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 B12 deficiency 02/08/2015  .  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 Chest discomfort 02/08/2015  . Diabetes ([place]) 02/08/2015  . Dizziness 02/08/2015  . Benign essential tremor 02/08/2015  . Fatigue 02/08/2015  . Acid reflux 02/08/2015  . HLD (hyperlipidemia) 02/08/2015  . BP (high blood pressure) 02/08/2015  . Hyperthyroidism 02/08/2015  . Arterial blood pressure decreased 02/08/2015  . Hernia, inguinal, left 02/08/2015  . Cannot sleep 02/08/2015  . Cancer of pancreas ([place]) 02/08/2015  . Pancreatitis 02/08/2015  . CA of prostate ([place]) 02/08/2015    Past Medical History:    Diagnosis Date  . Cancer [facility])    pancreatic  . Coronary artery disease   . Hypertension     Social History   Social History  . Marital status: Married    Spouse name: N/A  . Number of children: N/A  . Years of education: N/A   Occupational History  . Not on file.   Social History Main Topics  . Smoking status: Former Smoker    Packs/day: 0.50    Years: 30.00    Quit date: 10/06/1979  . Smokeless tobacco: Never Used  . Alcohol use 1.8 oz/week    3 Cans of beer per week     Comment: Previously heavy drinker  . Drug use: Unknown  . Sexual activity: Not on file   Other Topics Concern  . Not on file   Social History Narrative  . No narrative on file    Allergies  Allergen Reactions  . Doxycycline Other (See Comments)    Reaction:  Unknown  Pt refuses any allergy reaction  . Hydrocodone-Homatropine Other (See Comments)    Reaction:  Unknown  Pt refuses any allergy reaction    Review of Systems  Constitutional: Negative.   HENT: Negative.   Eyes: Negative.   Respiratory: Negative.   Cardiovascular: Negative.   Gastrointestinal: Positive for abdominal pain.  Genitourinary: Negative.   Musculoskeletal: Negative.   Skin: Negative.   Neurological: Negative.   Endo/Heme/Allergies: Negative.   Psychiatric/Behavioral: Negative.     Immunization History  Administered Date(s) Administered  . Influenza-Unspecified 07/09/2015  . Pneumococcal Conjugate-13 11/14/2013  . Pneumococcal Polysaccharide-23 06/08/2010  . Td 06/08/2006    Objective:  BP (!) 142/70 (BP Location: Left Arm, Patient Position: Sitting, Cuff Size: Normal)   Pulse 74   Temp 97.8 F (36.6 C) (Oral)   Resp 16   Wt 148 lb (67.1 kg)   BMI 21.54 kg/m   Physical Exam  Constitutional: He is oriented to person, place, and time and well-developed, well-nourished, and in no distress.  Thin white male who appears to be doing well for 82 years old  HENT:  Head: Normocephalic and atraumatic.   Right Ear: External ear normal.  Left Ear: External ear normal.  Nose: Nose normal.  Eyes: No scleral icterus.  Neck: No thyromegaly present.  Cardiovascular: Normal rate, regular rhythm, normal heart sounds and intact distal pulses.   Pulmonary/Chest: Effort normal and breath sounds normal.  Abdominal: Soft. He exhibits no mass. There is no tenderness.  Neurological: He is alert and oriented to person, place, and time. No cranial nerve deficit. Gait normal. GCS score is 15.  Skin: Skin is warm and dry.  Psychiatric: Mood, memory, affect and judgment normal.    Lab Results  Component Value Date   WBC 5.3 04/18/2016   HGB 13.0 04/27/2015   HCT 39.7 04/18/2016   PLT 203 04/18/2016   GLUCOSE 125 (H) 04/18/2016   CHOL 113 11/08/2015   TRIG  116 11/08/2015   HDL 36 11/08/2015   LDLCALC 54 11/08/2015   TSH 0.711 03/15/2015   HGBA1C 7.2 (H) 04/18/2016    CMP     Component Value Date/Time   NA 138 04/18/2016 1629   NA 140 01/17/2014 0413   K 4.7 04/18/2016 1629   K 3.8 01/17/2014 0413   CL 100 04/18/2016 1629   CL 107 01/17/2014 0413   CO2 25 04/18/2016 1629   CO2 27 01/17/2014 0413   GLUCOSE 125 (H) 04/18/2016 1629   GLUCOSE 218 (H) 04/27/2015 1859   GLUCOSE 125 (H) 01/17/2014 0413   BUN 19 04/18/2016 1629   BUN 16 01/17/2014 0413   CREATININE 1.20 04/18/2016 1629   CREATININE 1.10 01/17/2014 0413   CALCIUM 9.5 04/18/2016 1629   CALCIUM 8.0 (L) 01/17/2014 0413   PROT 6.5 04/18/2016 1629   PROT 7.4 01/16/2014 0201   ALBUMIN 4.5 04/18/2016 1629   ALBUMIN 4.0 01/16/2014 0201   AST 24 04/18/2016 1629   AST 151 (H) 01/16/2014 0201   ALT 30 04/18/2016 1629   ALT 100 (H) 01/16/2014 0201   ALKPHOS 71 04/18/2016 1629   ALKPHOS 88 01/16/2014 0201   BILITOT 0.4 04/18/2016 1629   BILITOT 0.5 01/16/2014 0201   GFRNONAA 55 (L) 04/18/2016 1629   GFRNONAA >60 01/17/2014 0413   GFRAA 63 04/18/2016 1629   GFRAA >60 01/17/2014 0413    Assessment and Plan :   1. Epigastric  pain Patient is on Prilosec to lower risk of possible PUD. At this time have him stop any Advil or Aleve and use only Tylenol or the Norco that is written today. Refer to GI for evaluation. Patient is very anxious about the possible etiology of this pain. Take omeprazole from daily to twice a day for now. - Ambulatory referral to Gastroenterology - Amylase - Lipase - Comprehensive metabolic panel No imaging ordered  pending GI referral. 2. Malignant neoplasm of other parts of pancreas [facility]) Patient had a Whipple procedure at [facility] for pancreatic cancer about 10 years ago.  3. Essential hypertension  - CBC w/Diff/Platelet - Comprehensive metabolic panel 4. CAD/status post CABG All risk factors treated. I do not think this pain is cardiac at all. I have done the exam and reviewed the above chart and it is accurate to the best of my knowledge. Development worker, community has been used in this note in any air is in the dictation or transcription are unintentional.  [facility] 09/10/2016 4:12 PM

## 2016-09-11 ENCOUNTER — Telehealth: Payer: Self-pay

## 2016-09-11 ENCOUNTER — Encounter: Payer: Self-pay | Admitting: Family Medicine

## 2016-09-11 LAB — CBC WITH DIFFERENTIAL/PLATELET
BASOS ABS: 0 10*3/uL (ref 0.0–0.2)
Basos: 0 %
EOS (ABSOLUTE): 0.1 10*3/uL (ref 0.0–0.4)
Eos: 1 %
Hematocrit: 39.7 % (ref 37.5–51.0)
Hemoglobin: 13.6 g/dL (ref 13.0–17.7)
Immature Grans (Abs): 0 10*3/uL (ref 0.0–0.1)
Immature Granulocytes: 0 %
LYMPHS ABS: 0.8 10*3/uL (ref 0.7–3.1)
LYMPHS: 7 %
MCH: 26.8 pg (ref 26.6–33.0)
MCHC: 34.3 g/dL (ref 31.5–35.7)
MCV: 78 fL — ABNORMAL LOW (ref 79–97)
MONOCYTES: 6 %
Monocytes Absolute: 0.6 10*3/uL (ref 0.1–0.9)
NEUTROS ABS: 9.5 10*3/uL — AB (ref 1.4–7.0)
Neutrophils: 86 %
PLATELETS: 248 10*3/uL (ref 150–379)
RBC: 5.07 x10E6/uL (ref 4.14–5.80)
RDW: 13.8 % (ref 12.3–15.4)
WBC: 10.9 10*3/uL — AB (ref 3.4–10.8)

## 2016-09-11 LAB — COMPREHENSIVE METABOLIC PANEL
ALK PHOS: 158 IU/L — AB (ref 39–117)
ALT: 153 IU/L — AB (ref 0–44)
AST: 98 IU/L — AB (ref 0–40)
Albumin/Globulin Ratio: 1.8 (ref 1.2–2.2)
Albumin: 4.4 g/dL (ref 3.5–4.7)
BILIRUBIN TOTAL: 0.8 mg/dL (ref 0.0–1.2)
BUN/Creatinine Ratio: 15 (ref 10–24)
BUN: 12 mg/dL (ref 8–27)
CHLORIDE: 95 mmol/L — AB (ref 96–106)
CO2: 25 mmol/L (ref 18–29)
CREATININE: 0.78 mg/dL (ref 0.76–1.27)
Calcium: 9.9 mg/dL (ref 8.6–10.2)
GFR calc Af Amer: 94 mL/min/{1.73_m2} (ref >59)
GFR calc non Af Amer: 82 mL/min/{1.73_m2} (ref >59)
GLUCOSE: 262 mg/dL — AB (ref 65–99)
Globulin, Total: 2.4 g/dL (ref 1.5–4.5)
Potassium: 4.4 mmol/L (ref 3.5–5.2)
Sodium: 136 mmol/L (ref 134–144)
Total Protein: 6.8 g/dL (ref 6.0–8.5)

## 2016-09-11 LAB — AMYLASE: AMYLASE: 50 U/L (ref 31–124)

## 2016-09-11 LAB — LIPASE: LIPASE: 149 U/L — AB (ref 13–78)

## 2016-09-11 NOTE — Telephone Encounter (Signed)
Advised patient of results. Patient is still having severe pain. Could we get this scheduled as soon as possible per patient request. Thanks!

## 2016-09-11 NOTE — Telephone Encounter (Signed)
-----   Message from Jerrol Banana., MD sent at 09/11/2016  8:13 AM EST ----- Labs mildly abnormal. Keep appointment with GI.

## 2016-09-11 NOTE — Telephone Encounter (Signed)
He is with [facility]-- dr Lynnell Jude new partner.

## 2016-09-11 NOTE — Telephone Encounter (Signed)
See if Dr. Vicente Males, the new GI doctor, will call me.

## 2016-09-11 NOTE — Telephone Encounter (Signed)
Done-aa 

## 2016-09-11 NOTE — Telephone Encounter (Signed)
-----   Message from Jerrol Banana., MD sent at 09/11/2016  1:55 PM EST ----- Labs reveal possible  mild pancreatitis. Keep appointment with GI. Is pain controlled to some degree?

## 2016-09-11 NOTE — Telephone Encounter (Signed)
Is she from [facility]? I cant find contact info.

## 2016-09-11 NOTE — Telephone Encounter (Signed)
Advised patient again. I sent a message earlier this morning because the patient reports that he was in pain. Is there a way we can expedite the referral? Please advise. Thanks!

## 2016-09-12 ENCOUNTER — Telehealth: Payer: Self-pay

## 2016-09-12 NOTE — Telephone Encounter (Signed)
Please let pt know they plan to work him in by Monday. Thx. To ED if he worsens over the weekend.

## 2016-09-12 NOTE — Telephone Encounter (Signed)
Left vm on both home and cell for pt to return my call to schedule an office appt per Dr. Rosanna Randy.

## 2016-09-12 NOTE — Telephone Encounter (Signed)
LMTCB

## 2016-09-15 ENCOUNTER — Ambulatory Visit (INDEPENDENT_AMBULATORY_CARE_PROVIDER_SITE_OTHER): Payer: Commercial Managed Care - HMO | Admitting: Gastroenterology

## 2016-09-15 ENCOUNTER — Encounter: Payer: Self-pay | Admitting: Gastroenterology

## 2016-09-15 VITALS — BP 124/68 | HR 76 | Temp 97.9°F | Ht 71.0 in | Wt 145.0 lb

## 2016-09-15 DIAGNOSIS — K5900 Constipation, unspecified: Secondary | ICD-10-CM

## 2016-09-15 DIAGNOSIS — K859 Acute pancreatitis without necrosis or infection, unspecified: Secondary | ICD-10-CM

## 2016-09-15 DIAGNOSIS — R1013 Epigastric pain: Secondary | ICD-10-CM

## 2016-09-15 NOTE — Progress Notes (Signed)
Gastroenterology Consultation  Referring Provider:     Jerrol Banana.,* Primary Care Physician:  Wilhemena Durie, MD Primary Gastroenterologist:  Dr. Allen Norris     Reason for Consultation:     Abdominal pain        HPI:   Anthony Craig is a 82 y.o. y/o male referred for consultation & management of Abdominal pain by Dr. Wilhemena Durie, MD.  This patient comes in today with a history of pancreas cancer and a Whipple surgery. The patient has had 2 episodes of pancreatitis in the last 10 years. The patient was seen by his primary care provider Dr. Rosanna Randy and reported abdominal pain. The patient was concerned because of his history of pancreatic cancer. The patient then had liver enzymes which were elevated and a lipase which was slightly elevated. The patient's amylase at that time was normal. The patient states that he was on NSAIDs and he has stopped that. He was also given pain medications to be taken every 4 hours which the patient continue to do despite the pain resolving. The patient states that his last pain medication was yesterday and it is caused him to have some constipation. There is no report of any unexplained weight loss, fevers, chills, nausea or vomiting. There is no report of any black stools or bloody stools. He states that the pain medication he was taking has caused him constipation with some bloating and discomfort but different from his pain that he had last week.  Past Medical History:  Diagnosis Date  . Cancer [facility])    pancreatic  . Coronary artery disease   . Hypertension     Past Surgical History:  Procedure Laterality Date  . APPENDECTOMY     1977  . CORONARY ARTERY BYPASS GRAFT     2001-3 vessel  . HERNIA REPAIR Bilateral    2016-left, 2009-right  . WHIPPLE PROCEDURE  2007    Prior to Admission medications   Medication Sig Start Date End Date Taking? Authorizing Provider  ACCU-CHEK SMARTVIEW test strip CHECK SUGAR THREE TIMES DAILY  04/28/16  Yes Vickki Muff Chrismon, PA  Alcohol Swabs (B-D SINGLE USE SWABS REGULAR) PADS USE AS DIRECTED  WITH  EACH  FINGERSTICK 12/24/15  Yes Jerrol Banana., MD  aspirin EC 81 MG tablet Take 81 mg by mouth daily as needed (for chest pain).    Yes Historical Provider, MD  atorvastatin (LIPITOR) 40 MG tablet TAKE 1 TABLET (40 MG TOTAL) BY MOUTH AT BEDTIME. 02/14/16  Yes Richard Maceo Pro., MD  b complex vitamins tablet Take 1 tablet by mouth daily.   Yes Historical Provider, MD  calcium-vitamin D 250-100 MG-UNIT per tablet Take 1 tablet by mouth 2 (two) times daily.   Yes Historical Provider, MD  cholecalciferol (VITAMIN D) 1000 UNITS tablet Take 1,000 Units by mouth daily.   Yes Historical Provider, MD  Coenzyme Q10 (COQ10) 200 MG CAPS Take 1 capsule by mouth daily.   Yes Historical Provider, MD  diphenhydrAMINE (BENADRYL) 25 MG tablet Take 25 mg by mouth every 6 (six) hours as needed.   Yes Historical Provider, MD  diphenoxylate-atropine (LOMOTIL) 2.5-0.025 MG per tablet Take 1 tablet by mouth 4 (four) times daily as needed for diarrhea or loose stools.   Yes Historical Provider, MD  HYDROcodone-acetaminophen (NORCO/VICODIN) 5-325 MG tablet Take 1-2 tablets by mouth every 4 (four) hours as needed for moderate pain. 09/10/16  Yes Richard Maceo Pro., MD  insulin  aspart (NOVOLOG FLEXPEN) 100 UNIT/ML FlexPen Inject 4 Units into the skin 3 (three) times daily with meals. 90 day supply. Generic ok. 01/21/16  Yes Richard Maceo Pro., MD  Insulin Detemir (LEVEMIR FLEXTOUCH) 100 UNIT/ML Pen Inject 60 Units into the skin daily at 10 pm. 04/25/16  Yes Jerrol Banana., MD  lipase/protease/amylase (CREON) 12000 UNITS CPEP capsule Take 1 capsule (12,000 Units total) by mouth 4 (four) times daily. 06/15/15  Yes Richard Maceo Pro., MD  Multiple Vitamins-Minerals (MULTIVITAMIN PO) Take 1 tablet by mouth daily.   Yes Historical Provider, MD  omega-3 acid ethyl esters (LOVAZA) 1 g capsule Take by mouth 2  (two) times daily.   Yes Historical Provider, MD  omeprazole (PRILOSEC) 20 MG capsule Take 1 capsule (20 mg total) by mouth 2 (two) times daily. 09/10/16  Yes Richard Maceo Pro., MD  primidone (MYSOLINE) 50 MG tablet Take by mouth 4 (four) times daily.   Yes Historical Provider, MD  vitamin B-12 (CYANOCOBALAMIN) 1000 MCG tablet Take 1,000 mcg by mouth daily.   Yes Historical Provider, MD  fluticasone (FLONASE) 50 MCG/ACT nasal spray Place 2 sprays into both nostrils daily. Patient not taking: Reported on 09/15/2016 05/28/15   Jerrol Banana., MD  simethicone (MYLICON) 0000000 MG chewable tablet Chew 125 mg by mouth every 6 (six) hours as needed for flatulence.    Historical Provider, MD    Family History  Problem Relation Age of Onset  . Heart disease Mother   . Cancer Mother 77    breast  . Heart disease Father   . COPD Brother   . Stroke Brother   . Diabetes Other     1 and 2     Social History  Substance Use Topics  . Smoking status: Former Smoker    Packs/day: 0.50    Years: 30.00    Quit date: 10/06/1979  . Smokeless tobacco: Never Used  . Alcohol use 1.8 oz/week    3 Cans of beer per week     Comment: Previously heavy drinker    Allergies as of 09/15/2016 - Review Complete 09/15/2016  Allergen Reaction Noted  . Doxycycline Other (See Comments) 02/08/2015  . Hydrocodone-homatropine Other (See Comments) 02/08/2015    Review of Systems:    All systems reviewed and negative except where noted in HPI.   Physical Exam:  BP 124/68   Pulse 76   Temp 97.9 F (36.6 C) (Oral)   Ht 5\' 11"  (1.803 m)   Wt 145 lb (65.8 kg)   BMI 20.22 kg/m  No LMP for male patient. Psych:  Alert and cooperative. Normal mood and affect. General:   Alert,  Well-developed, well-nourished, pleasant and cooperative in NAD Head:  Normocephalic and atraumatic. Eyes:  Sclera clear, no icterus.   Conjunctiva pink. Ears:  Normal auditory acuity. Nose:  No deformity, discharge, or  lesions. Mouth:  No deformity or lesions,oropharynx pink & moist. Neck:  Supple; no masses or thyromegaly. Lungs:  Respirations even and unlabored.  Clear throughout to auscultation.   No wheezes, crackles, or rhonchi. No acute distress. Heart:  Regular rate and rhythm; no murmurs, clicks, rubs, or gallops. Abdomen:  Normal bowel sounds.  No bruits.  Soft, non-tender and non-distended without masses, hepatosplenomegaly or hernias noted.  No guarding or rebound tenderness.  Negative Carnett sign.   Rectal:  Deferred.  Msk:  Symmetrical without gross deformities.  Good, equal movement & strength bilaterally. Pulses:  Normal pulses noted. Extremities:  No clubbing or edema.  No cyanosis. Neurologic:  Alert and oriented x3;  grossly normal neurologically. Skin:  Intact without significant lesions or rashes.  No jaundice. Lymph Nodes:  No significant cervical adenopathy. Psych:  Alert and cooperative. Normal mood and affect.  Imaging Studies: No results found.  Assessment and Plan:   Tayen Beardall is a 82 y.o. y/o male who comes in today with a history of abdominal pain that was present last week. The patient states the pain has completely resolved and he is back to his baseline. The patient is not interested in repeating his CT at the present time. The patient will have his labs sent off for repeat amylase and liver enzymes. The patient has been told that if these are elevated we will then consider repeating his CT scan although he states that if another mass was found she would not want to undergo any further surgery. The patient will also stop taking the pain medication. He will also avoid anti-inflammatory medication. The patient will be notified of his lab results. The patient and his wife have been explained the plan and agree with it.    Lucilla Lame, MD. [facility]   Note: This dictation was prepared with Dragon dictation along with smaller phrase technology. Any transcriptional errors  that result from this process are unintentional.

## 2016-09-15 NOTE — Telephone Encounter (Signed)
Patient is aware of the appointment with Dr Allen Norris this afternoon-aa

## 2016-09-16 ENCOUNTER — Ambulatory Visit: Payer: Commercial Managed Care - HMO | Admitting: Gastroenterology

## 2016-09-16 LAB — HEPATIC FUNCTION PANEL
ALK PHOS: 131 IU/L — AB (ref 39–117)
ALT: 71 IU/L — ABNORMAL HIGH (ref 0–44)
AST: 37 IU/L (ref 0–40)
Albumin: 4.2 g/dL (ref 3.5–4.7)
BILIRUBIN TOTAL: 0.3 mg/dL (ref 0.0–1.2)
BILIRUBIN, DIRECT: 0.13 mg/dL (ref 0.00–0.40)
TOTAL PROTEIN: 6.7 g/dL (ref 6.0–8.5)

## 2016-09-16 LAB — LIPASE: LIPASE: 6 U/L — AB (ref 13–78)

## 2016-09-18 NOTE — Telephone Encounter (Signed)
-----   Message from Lucilla Lame, MD sent at 09/16/2016  7:38 AM EST ----- Let the patient know that the labs are better and near normal.

## 2016-09-18 NOTE — Telephone Encounter (Signed)
Pt notified of lab results. Pt requested a copy of these results be mailed to his home.

## 2016-10-28 DIAGNOSIS — R69 Illness, unspecified: Secondary | ICD-10-CM | POA: Diagnosis not present

## 2016-11-10 ENCOUNTER — Ambulatory Visit (INDEPENDENT_AMBULATORY_CARE_PROVIDER_SITE_OTHER): Payer: Medicare HMO | Admitting: Family Medicine

## 2016-11-10 VITALS — BP 124/62 | HR 72 | Temp 97.6°F | Resp 12 | Wt 145.0 lb

## 2016-11-10 DIAGNOSIS — I7300 Raynaud's syndrome without gangrene: Secondary | ICD-10-CM

## 2016-11-10 DIAGNOSIS — R300 Dysuria: Secondary | ICD-10-CM | POA: Diagnosis not present

## 2016-11-10 DIAGNOSIS — E118 Type 2 diabetes mellitus with unspecified complications: Secondary | ICD-10-CM

## 2016-11-10 DIAGNOSIS — Z794 Long term (current) use of insulin: Secondary | ICD-10-CM | POA: Diagnosis not present

## 2016-11-10 DIAGNOSIS — R1013 Epigastric pain: Secondary | ICD-10-CM | POA: Diagnosis not present

## 2016-11-10 DIAGNOSIS — C257 Malignant neoplasm of other parts of pancreas: Secondary | ICD-10-CM

## 2016-11-10 LAB — POCT URINALYSIS DIPSTICK
Bilirubin, UA: NEGATIVE
Blood, UA: NEGATIVE
Glucose, UA: NEGATIVE
Ketones, UA: NEGATIVE
LEUKOCYTES UA: NEGATIVE
Nitrite, UA: NEGATIVE
PH UA: 6
PROTEIN UA: NEGATIVE
SPEC GRAV UA: 1.01
Urobilinogen, UA: 0.2

## 2016-11-10 NOTE — Progress Notes (Signed)
Anthony Craig  MRN: [id number] DOB: 08-01-1928  Subjective:  HPI  Patient is here with complaint of urinary issues. He has noticed for a while now that his urine color has been getting darker and has started to develop burning sensation with urination, not severe but enough to notice. Some low back pain at times. Urgency and frequency present. Denies any other symptoms. No blood. UA is normal today in the office. He also has had trouble with his fingers, when his fingers get really cold he will get sensation of numb feeling and tips of the fingers get a white color to them. This lasts until fingers get warm. Patient Active Problem List   Diagnosis Date Noted  . Chest pain 04/27/2015  . Allergic drug reaction 02/08/2015  . 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 B12 deficiency 02/08/2015  . 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 Chest discomfort 02/08/2015  . Diabetes ([place]) 02/08/2015  . Dizziness 02/08/2015  . Benign essential tremor 02/08/2015  . Fatigue 02/08/2015  . Acid reflux 02/08/2015  . HLD (hyperlipidemia) 02/08/2015  . BP (high blood pressure) 02/08/2015  . Hyperthyroidism 02/08/2015  . Arterial blood pressure decreased 02/08/2015  . Hernia, inguinal, left 02/08/2015  . Cannot sleep 02/08/2015  . Cancer of pancreas ([place]) 02/08/2015  . Pancreatitis 02/08/2015  . CA of prostate ([place]) 02/08/2015    Past Medical History:  Diagnosis Date  . Cancer [facility])    pancreatic  . Coronary artery disease   . Hypertension     Social History   Social History  . Marital status: Married    Spouse name: N/A  . Number of children: N/A  . Years of education: N/A   Occupational History  . Not on file.   Social History Main Topics  . Smoking status: Former Smoker    Packs/day: 0.50    Years: 30.00    Quit date: 10/06/1979  . Smokeless tobacco: Never Used  . Alcohol use 1.8 oz/week    3 Cans of beer per week   Comment: Previously heavy drinker  . Drug use: No  . Sexual activity: Not on file   Other Topics Concern  . Not on file   Social History Narrative  . No narrative on file    Outpatient Encounter Prescriptions as of 11/10/2016  Medication Sig Note  . ACCU-CHEK SMARTVIEW test strip CHECK SUGAR THREE TIMES DAILY   . Alcohol Swabs (B-D SINGLE USE SWABS REGULAR) PADS USE AS DIRECTED  WITH  EACH  FINGERSTICK   . aspirin EC 81 MG tablet Take 81 mg by mouth daily as needed (for chest pain).    Marland Kitchen atorvastatin (LIPITOR) 40 MG tablet TAKE 1 TABLET (40 MG TOTAL) BY MOUTH AT BEDTIME.   Marland Kitchen b complex vitamins tablet Take 1 tablet by mouth daily.   . calcium-vitamin D 250-100 MG-UNIT per tablet Take 1 tablet by mouth 2 (two) times daily.   . cholecalciferol (VITAMIN D) 1000 UNITS tablet Take 1,000 Units by mouth daily.   . Coenzyme Q10 (COQ10) 200 MG CAPS Take 1 capsule by mouth daily.   . diphenoxylate-atropine (LOMOTIL) 2.5-0.025 MG per tablet Take 1 tablet by mouth 4 (four) times daily as needed for diarrhea or loose stools.   . insulin aspart (NOVOLOG FLEXPEN) 100 UNIT/ML FlexPen Inject 4 Units into the skin 3 (three) times daily with meals. 90 day supply. Generic ok.   . Insulin Detemir (LEVEMIR FLEXTOUCH) 100 UNIT/ML Pen Inject  60 Units into the skin daily at 10 pm.   . lipase/protease/amylase (CREON) 12000 UNITS CPEP capsule Take 1 capsule (12,000 Units total) by mouth 4 (four) times daily.   . Multiple Vitamins-Minerals (MULTIVITAMIN PO) Take 1 tablet by mouth daily.   Marland Kitchen omega-3 acid ethyl esters (LOVAZA) 1 g capsule Take by mouth 2 (two) times daily.   Marland Kitchen omeprazole (PRILOSEC) 20 MG capsule Take 1 capsule (20 mg total) by mouth 2 (two) times daily.   . primidone (MYSOLINE) 50 MG tablet Take by mouth 4 (four) times daily.   . vitamin B-12 (CYANOCOBALAMIN) 1000 MCG tablet Take 1,000 mcg by mouth daily.   . diphenhydrAMINE (BENADRYL) 25 MG tablet Take 25 mg by mouth every 6 (six) hours as needed.  11/10/2016: prn  . HYDROcodone-acetaminophen (NORCO/VICODIN) 5-325 MG tablet Take 1-2 tablets by mouth every 4 (four) hours as needed for moderate pain. (Patient not taking: Reported on 11/10/2016) 11/10/2016: prn  . [DISCONTINUED] fluticasone (FLONASE) 50 MCG/ACT nasal spray Place 2 sprays into both nostrils daily. (Patient not taking: Reported on 09/15/2016)   . [DISCONTINUED] simethicone (MYLICON) 0000000 MG chewable tablet Chew 125 mg by mouth every 6 (six) hours as needed for flatulence.    No facility-administered encounter medications on file as of 11/10/2016.     Allergies  Allergen Reactions  . Doxycycline Other (See Comments)    Reaction:  Unknown  Pt refuses any allergy reaction  . Hydrocodone-Homatropine Other (See Comments)    Reaction:  Unknown  Pt refuses any allergy reaction    Review of Systems  Constitutional: Negative.   Eyes: Negative.   Respiratory: Negative.   Cardiovascular: Negative.   Gastrointestinal: Negative.   Genitourinary: Positive for frequency and urgency.       Burning with urination  Musculoskeletal: Positive for back pain.  Skin: Negative.   Neurological:       Numbness in the finger tips when they get cold and turn white.  Endo/Heme/Allergies: Negative.   Psychiatric/Behavioral: Negative.     Objective:  BP 124/62   Pulse 72   Temp 97.6 F (36.4 C)   Resp 12   Wt 145 lb (65.8 kg)   BMI 20.22 kg/m   Physical Exam  Constitutional: He is oriented to person, place, and time and well-developed, well-nourished, and in no distress.  HENT:  Head: Normocephalic and atraumatic.  Right Ear: External ear normal.  Left Ear: External ear normal.  Nose: Nose normal.  Eyes: Conjunctivae are normal. Pupils are equal, round, and reactive to light.  Neck: Normal range of motion. Neck supple. No thyromegaly present.  Cardiovascular: Normal rate, regular rhythm, normal heart sounds and intact distal pulses.   No murmur heard. Pulmonary/Chest: Effort normal  and breath sounds normal.  Abdominal: Soft. He exhibits no distension. There is no tenderness.  Neurological: He is alert and oriented to person, place, and time. Gait normal. GCS score is 15.  Skin: Skin is warm and dry.  Psychiatric: Mood, memory, affect and judgment normal.    Assessment and Plan :  1. Raynaud's phenomenon without gangrene I think the symptoms patient is having is this related. Follow. Amlodipine could help with this if it gets worse.  2. Burning with urination UA normal today. Advised patient to push fluids. Seems to be dehydrated a little. - POCT Urinalysis Dipstick  3. Epigastric pain Better.  4. Malignant neoplasm of other parts of pancreas ([place])  In remission  at this time. 5. DM Patient states he will probably  start going to [place] to get insulin due to the rising cost of insulin. Patient needs new strips sent in but patient will let us know first which meter he needs. HPI, Exam and A&P transcribed under direction and in the presence of Miguel Aschoff, MD. I have done the exam and reviewed the chart and it is accurate to the best of my knowledge. Development worker, community has been used and  any errors in dictation or transcription are unintentional. Miguel Aschoff M.D. [facility]

## 2016-12-02 ENCOUNTER — Other Ambulatory Visit: Payer: Self-pay

## 2016-12-02 MED ORDER — PRIMIDONE 50 MG PO TABS: 50.0000 mg | ORAL_TABLET | Freq: Four times a day (QID) | ORAL | 3 refills | Status: DC

## 2016-12-02 NOTE — Telephone Encounter (Signed)
Refill request from [facility] mail order pharmacy requesting refill on Primidone. Please review-aa

## 2017-01-12 DIAGNOSIS — K869 Disease of pancreas, unspecified: Secondary | ICD-10-CM | POA: Diagnosis not present

## 2017-01-12 DIAGNOSIS — Z90410 Acquired total absence of pancreas: Secondary | ICD-10-CM | POA: Diagnosis not present

## 2017-01-12 DIAGNOSIS — Z9849 Cataract extraction status, unspecified eye: Secondary | ICD-10-CM | POA: Diagnosis not present

## 2017-01-12 DIAGNOSIS — G250 Essential tremor: Secondary | ICD-10-CM | POA: Diagnosis not present

## 2017-01-12 DIAGNOSIS — Z87891 Personal history of nicotine dependence: Secondary | ICD-10-CM | POA: Diagnosis not present

## 2017-01-12 DIAGNOSIS — Z8507 Personal history of malignant neoplasm of pancreas: Secondary | ICD-10-CM | POA: Diagnosis not present

## 2017-01-12 DIAGNOSIS — G4700 Insomnia, unspecified: Secondary | ICD-10-CM | POA: Diagnosis not present

## 2017-01-12 DIAGNOSIS — R109 Unspecified abdominal pain: Secondary | ICD-10-CM | POA: Diagnosis not present

## 2017-01-12 DIAGNOSIS — E7800 Pure hypercholesterolemia, unspecified: Secondary | ICD-10-CM | POA: Diagnosis not present

## 2017-01-12 DIAGNOSIS — Z6820 Body mass index (BMI) 20.0-20.9, adult: Secondary | ICD-10-CM | POA: Diagnosis not present

## 2017-01-12 DIAGNOSIS — Z0000 Encounter for general adult medical examination without abnormal findings: Secondary | ICD-10-CM | POA: Diagnosis not present

## 2017-01-12 DIAGNOSIS — K219 Gastro-esophageal reflux disease without esophagitis: Secondary | ICD-10-CM | POA: Diagnosis not present

## 2017-01-12 DIAGNOSIS — Z79899 Other long term (current) drug therapy: Secondary | ICD-10-CM | POA: Diagnosis not present

## 2017-01-12 DIAGNOSIS — H9313 Tinnitus, bilateral: Secondary | ICD-10-CM | POA: Diagnosis not present

## 2017-01-12 DIAGNOSIS — E119 Type 2 diabetes mellitus without complications: Secondary | ICD-10-CM | POA: Diagnosis not present

## 2017-03-09 LAB — HEMOGLOBIN A1C: HEMOGLOBIN A1C: 7.4

## 2017-03-09 LAB — BASIC METABOLIC PANEL
BUN: 13 (ref 4–21)
Creatinine: 0.9 (ref 0.6–1.3)
Glucose: 219
POTASSIUM: 5.2 (ref 3.4–5.3)
SODIUM: 137 (ref 137–147)

## 2017-04-07 ENCOUNTER — Telehealth: Payer: Self-pay | Admitting: Family Medicine

## 2017-04-14 LAB — HEPATIC FUNCTION PANEL
ALT: 84 — AB (ref 10–40)
AST: 39 (ref 14–40)
Alkaline Phosphatase: 136 — AB (ref 25–125)
BILIRUBIN, TOTAL: 0.4

## 2017-05-18 ENCOUNTER — Ambulatory Visit: Payer: Medicare HMO | Admitting: Family Medicine

## 2017-05-21 ENCOUNTER — Ambulatory Visit: Payer: Medicare HMO

## 2017-05-21 ENCOUNTER — Ambulatory Visit: Payer: Medicare HMO | Admitting: Family Medicine

## 2017-05-28 ENCOUNTER — Ambulatory Visit: Payer: Medicare HMO | Admitting: Family Medicine

## 2017-05-28 ENCOUNTER — Ambulatory Visit (INDEPENDENT_AMBULATORY_CARE_PROVIDER_SITE_OTHER): Payer: Medicare HMO

## 2017-05-28 VITALS — BP 128/66 | HR 64 | Temp 97.4°F | Ht 71.0 in | Wt 150.2 lb

## 2017-05-28 VITALS — BP 128/66 | HR 64 | Temp 97.4°F | Ht 71.0 in | Wt 150.0 lb

## 2017-05-28 DIAGNOSIS — C250 Malignant neoplasm of head of pancreas: Secondary | ICD-10-CM

## 2017-05-28 DIAGNOSIS — Z0000 Encounter for general adult medical examination without abnormal findings: Secondary | ICD-10-CM | POA: Diagnosis not present

## 2017-05-28 DIAGNOSIS — I25812 Atherosclerosis of bypass graft of coronary artery of transplanted heart without angina pectoris: Secondary | ICD-10-CM

## 2017-05-28 NOTE — Patient Instructions (Addendum)
Anthony Craig , Thank you for taking time to come for your Medicare Wellness Visit. I appreciate your ongoing commitment to your health goals. Please review the following plan we discussed and let me know if I can assist you in the future.   Screening recommendations/referrals: Colonoscopy: up to date, no longer required Recommended yearly ophthalmology/optometry visit for glaucoma screening and checkup Recommended yearly dental visit for hygiene and checkup  Vaccinations: Influenza vaccine: declined today Pneumococcal vaccine: completed series Tdap vaccine: declined today Shingles vaccine: completed per patient- requested copy  Advanced directives: Please bring a copy of your POA (Power of Attorney) and/or Living Will to your next appointment.   Conditions/risks identified: Recommend increasing water intake to 4-6 glasses of water a day.  Next appointment: None,   Preventive Care 81 Years and Older, Male Preventive care refers to lifestyle choices and visits with your health care provider that can promote health and wellness. What does preventive care include?  A yearly physical exam. This is also called an annual well check.  Dental exams once or twice a year.  Routine eye exams. Ask your health care provider how often you should have your eyes checked.  Personal lifestyle choices, including:  Daily care of your teeth and gums.  Regular physical activity.  Eating a healthy diet.  Avoiding tobacco and drug use.  Limiting alcohol use.  Practicing safe sex.  Taking low doses of aspirin every day.  Taking vitamin and mineral supplements as recommended by your health care provider. What happens during an annual well check? The services and screenings done by your health care provider during your annual well check will depend on your age, overall health, lifestyle risk factors, and family history of disease. Counseling  Your health care provider may ask you questions  about your:  Alcohol use.  Tobacco use.  Drug use.  Emotional well-being.  Home and relationship well-being.  Sexual activity.  Eating habits.  History of falls.  Memory and ability to understand (cognition).  Work and work Statistician. Screening  You may have the following tests or measurements:  Height, weight, and BMI.  Blood pressure.  Lipid and cholesterol levels. These may be checked every 5 years, or more frequently if you are over 19 years old.  Skin check.  Lung cancer screening. You may have this screening every year starting at age 63 if you have a 30-pack-year history of smoking and currently smoke or have quit within the past 15 years.  Fecal occult blood test (FOBT) of the stool. You may have this test every year starting at age 91.  Flexible sigmoidoscopy or colonoscopy. You may have a sigmoidoscopy every 5 years or a colonoscopy every 10 years starting at age 26.  Prostate cancer screening. Recommendations will vary depending on your family history and other risks.  Hepatitis C blood test.  Hepatitis B blood test.  Sexually transmitted disease (STD) testing.  Diabetes screening. This is done by checking your blood sugar (glucose) after you have not eaten for a while (fasting). You may have this done every 1-3 years.  Abdominal aortic aneurysm (AAA) screening. You may need this if you are a current or former smoker.  Osteoporosis. You may be screened starting at age 62 if you are at high risk. Talk with your health care provider about your test results, treatment options, and if necessary, the need for more tests. Vaccines  Your health care provider may recommend certain vaccines, such as:  Influenza vaccine. This is recommended  every year.  Tetanus, diphtheria, and acellular pertussis (Tdap, Td) vaccine. You may need a Td booster every 10 years.  Zoster vaccine. You may need this after age 19.  Pneumococcal 13-valent conjugate (PCV13)  vaccine. One dose is recommended after age 57.  Pneumococcal polysaccharide (PPSV23) vaccine. One dose is recommended after age 23. Talk to your health care provider about which screenings and vaccines you need and how often you need them. This information is not intended to replace advice given to you by your health care provider. Make sure you discuss any questions you have with your health care provider. Document Released: 10/19/2015 Document Revised: 06/11/2016 Document Reviewed: 07/24/2015 Elsevier Interactive Patient Education  2017 [place] Prevention in the Home Falls can cause injuries. They can happen to people of all ages. There are many things you can do to make your home safe and to help prevent falls. What can I do on the outside of my home?  Regularly fix the edges of walkways and driveways and fix any cracks.  Remove anything that might make you trip as you walk through a door, such as a raised step or threshold.  Trim any bushes or trees on the path to your home.  Use bright outdoor lighting.  Clear any walking paths of anything that might make someone trip, such as rocks or tools.  Regularly check to see if handrails are loose or broken. Make sure that both sides of any steps have handrails.  Any raised decks and porches should have guardrails on the edges.  Have any leaves, snow, or ice cleared regularly.  Use sand or salt on walking paths during winter.  Clean up any spills in your garage right away. This includes oil or grease spills. What can I do in the bathroom?  Use night lights.  Install grab bars by the toilet and in the tub and shower. Do not use towel bars as grab bars.  Use non-skid mats or decals in the tub or shower.  If you need to sit down in the shower, use a plastic, non-slip stool.  Keep the floor dry. Clean up any water that spills on the floor as soon as it happens.  Remove soap buildup in the tub or shower  regularly.  Attach bath mats securely with double-sided non-slip rug tape.  Do not have throw rugs and other things on the floor that can make you trip. What can I do in the bedroom?  Use night lights.  Make sure that you have a light by your bed that is easy to reach.  Do not use any sheets or blankets that are too big for your bed. They should not hang down onto the floor.  Have a firm chair that has side arms. You can use this for support while you get dressed.  Do not have throw rugs and other things on the floor that can make you trip. What can I do in the kitchen?  Clean up any spills right away.  Avoid walking on wet floors.  Keep items that you use a lot in easy-to-reach places.  If you need to reach something above you, use a strong step stool that has a grab bar.  Keep electrical cords out of the way.  Do not use floor polish or wax that makes floors slippery. If you must use wax, use non-skid floor wax.  Do not have throw rugs and other things on the floor that can make you trip. What  can I do with my stairs?  Do not leave any items on the stairs.  Make sure that there are handrails on both sides of the stairs and use them. Fix handrails that are broken or loose. Make sure that handrails are as long as the stairways.  Check any carpeting to make sure that it is firmly attached to the stairs. Fix any carpet that is loose or worn.  Avoid having throw rugs at the top or bottom of the stairs. If you do have throw rugs, attach them to the floor with carpet tape.  Make sure that you have a light switch at the top of the stairs and the bottom of the stairs. If you do not have them, ask someone to add them for you. What else can I do to help prevent falls?  Wear shoes that:  Do not have high heels.  Have rubber bottoms.  Are comfortable and fit you well.  Are closed at the toe. Do not wear sandals.  If you use a stepladder:  Make sure that it is fully  opened. Do not climb a closed stepladder.  Make sure that both sides of the stepladder are locked into place.  Ask someone to hold it for you, if possible.  Clearly mark and make sure that you can see:  Any grab bars or handrails.  First and last steps.  Where the edge of each step is.  Use tools that help you move around (mobility aids) if they are needed. These include:  Canes.  Walkers.  Scooters.  Crutches.  Turn on the lights when you go into a dark area. Replace any light bulbs as soon as they burn out.  Set up your furniture so you have a clear path. Avoid moving your furniture around.  If any of your floors are uneven, fix them.  If there are any pets around you, be aware of where they are.  Review your medicines with your doctor. Some medicines can make you feel dizzy. This can increase your chance of falling. Ask your doctor what other things that you can do to help prevent falls. This information is not intended to replace advice given to you by your health care provider. Make sure you discuss any questions you have with your health care provider. Document Released: 07/19/2009 Document Revised: 02/28/2016 Document Reviewed: 10/27/2014 Elsevier Interactive Patient Education  2017 [employer].

## 2017-05-28 NOTE — Progress Notes (Signed)
Subjective:   Anthony Craig is a 83 y.o. male who presents for Medicare Annual/Subsequent preventive examination.  Review of Systems:  N/A Cardiac Risk Factors include: advanced age (>63men, >80 women);dyslipidemia;hypertension;male gender;diabetes mellitus     Objective:    Vitals: BP 128/66 (BP Location: Left Arm)   Pulse 64   Temp (!) 97.4 F (36.3 C) (Oral)   Ht 5\' 11"  (1.803 m)   Wt 150 lb 3.2 oz (68.1 kg)   BMI 20.95 kg/m   Body mass index is 20.95 kg/m.  Tobacco History  Smoking Status  . Former Smoker  . Packs/day: 0.50  . Years: 30.00  . Quit date: 10/06/1979  Smokeless Tobacco  . Never Used     Counseling given: Not Answered   Past Medical History:  Diagnosis Date  . Cancer [facility])    pancreatic  . Coronary artery disease   . Hypertension    Past Surgical History:  Procedure Laterality Date  . APPENDECTOMY     1977  . CORONARY ARTERY BYPASS GRAFT     2001-3 vessel  . HERNIA REPAIR Bilateral    2016-left, 2009-right  . WHIPPLE PROCEDURE  2007   Family History  Problem Relation Age of Onset  . Heart disease Mother   . Cancer Mother 40       breast  . Heart disease Father   . COPD Brother   . Stroke Brother   . Diabetes Other        1 and 2   History  Sexual Activity  . Sexual activity: Not on file    Outpatient Encounter Prescriptions as of 05/28/2017  Medication Sig  . ACCU-CHEK SMARTVIEW test strip CHECK SUGAR THREE TIMES DAILY  . Alcohol Swabs (B-D SINGLE USE SWABS REGULAR) PADS USE AS DIRECTED  WITH  EACH  FINGERSTICK  . aspirin EC 81 MG tablet Take 81 mg by mouth daily as needed (for chest pain).   Marland Kitchen atorvastatin (LIPITOR) 40 MG tablet TAKE 1 TABLET (40 MG TOTAL) BY MOUTH AT BEDTIME.  Marland Kitchen b complex vitamins tablet Take 1 tablet by mouth daily.  . calcium-vitamin D 250-100 MG-UNIT per tablet Take 1 tablet by mouth 2 (two) times daily.  . cholecalciferol (VITAMIN D) 1000 UNITS tablet Take 1,000 Units by mouth daily.  .  Coenzyme Q10 (COQ10) 200 MG CAPS Take 1 capsule by mouth daily.  . diphenhydrAMINE (BENADRYL) 25 MG tablet Take 25 mg by mouth every 6 (six) hours as needed.  . diphenoxylate-atropine (LOMOTIL) 2.5-0.025 MG per tablet Take 1 tablet by mouth 4 (four) times daily as needed for diarrhea or loose stools.  Marland Kitchen HYDROcodone-acetaminophen (NORCO/VICODIN) 5-325 MG tablet Take 1-2 tablets by mouth every 4 (four) hours as needed for moderate pain.  Marland Kitchen insulin aspart (NOVOLOG FLEXPEN) 100 UNIT/ML FlexPen Inject 4 Units into the skin 3 (three) times daily with meals. 90 day supply. Generic ok. (Patient taking differently: Inject 5-6 Units into the skin 3 (three) times daily with meals. 90 day supply. Generic ok.)  . Insulin Detemir (LEVEMIR FLEXTOUCH) 100 UNIT/ML Pen Inject 60 Units into the skin daily at 10 pm.  . lipase/protease/amylase (CREON) 36000 UNITS CPEP capsule Take 36,000 Units by mouth.  . Multiple Vitamins-Minerals (MULTIVITAMIN PO) Take 1 tablet by mouth daily.  Marland Kitchen omeprazole (PRILOSEC) 20 MG capsule Take 1 capsule (20 mg total) by mouth 2 (two) times daily.  . primidone (MYSOLINE) 50 MG tablet Take 1 tablet (50 mg total) by mouth 4 (four) times daily.  Marland Kitchen  vitamin B-12 (CYANOCOBALAMIN) 1000 MCG tablet Take 1,000 mcg by mouth daily.  . [DISCONTINUED] lipase/protease/amylase (CREON) 12000 UNITS CPEP capsule Take 1 capsule (12,000 Units total) by mouth 4 (four) times daily. (Patient taking differently: Take 24,000 Units by mouth 4 (four) times daily. )  . omega-3 acid ethyl esters (LOVAZA) 1 g capsule Take by mouth 2 (two) times daily.   No facility-administered encounter medications on file as of 05/28/2017.     Activities of Daily Living In your present state of health, do you have any difficulty performing the following activities: 05/28/2017 05/29/2016  Hearing? Tempie Donning  Vision? Y Y  Difficulty concentrating or making decisions? N Y  Walking or climbing stairs? N N  Dressing or bathing? N N  Doing  errands, shopping? N N  Preparing Food and eating ? N -  Using the Toilet? N -  In the past six months, have you accidently leaked urine? Y -  Comment occasionally with movement -  Managing your Medications? N -  Managing your Finances? N -  Housekeeping or managing your Housekeeping? N -  Some recent data might be hidden    Patient Care Team: Jerrol Banana., MD as PCP - General (Family Medicine) Benito Mccreedy, MD as Referring Physician (Family Medicine)   Assessment:     Exercise Activities and Dietary recommendations Current Exercise Habits: Structured exercise class, Type of exercise: walking;strength training/weights;stretching;treadmill, Time (Minutes): > 60, Frequency (Times/Week): 3, Weekly Exercise (Minutes/Week): 0, Intensity: Mild  Goals    . water intake          Recommend increasing water intake to 4-6 glasses of water a day.      Fall Risk Fall Risk  05/28/2017 05/29/2016 11/28/2015  Falls in the past year? No Yes No  Number falls in past yr: - 1 -  Injury with Fall? - No -   Depression Screen PHQ 2/9 Scores 05/28/2017 05/29/2016 11/28/2015  PHQ - 2 Score 0 0 0    Cognitive Function     6CIT Screen 05/28/2017  What Year? 0 points  What month? 0 points  What time? 0 points  Count back from 20 0 points  Months in reverse 0 points  Repeat phrase 0 points  Total Score 0    Immunization History  Administered Date(s) Administered  . Influenza-Unspecified 07/09/2015  . Pneumococcal Conjugate-13 11/14/2013  . Pneumococcal Polysaccharide-23 06/08/2010  . Td 06/08/2006   Screening Tests Health Maintenance  Topic Date Due  . URINE MICROALBUMIN  06[birth year]/1940  . HEMOGLOBIN A1C  10/19/2016  . INFLUENZA VACCINE  05/06/2017  . TETANUS/TDAP  05/06/2018 (Originally 06/08/2016)  . OPHTHALMOLOGY EXAM  06/06/2017  . FOOT EXAM  02/03/2018  . PNA vac Low Risk Adult  Completed      Plan:  I have personally reviewed and addressed the Medicare Annual Wellness  questionnaire and have noted the following in the patient's chart:  A. Medical and social history B. Use of alcohol, tobacco or illicit drugs  C. Current medications and supplements D. Functional ability and status E.  Nutritional status F.  Physical activity G. Advance directives H. List of other physicians I.  Hospitalizations, surgeries, and ER visits in previous 12 months J.  [place] such as hearing and vision if needed, cognitive and depression L. Referrals and appointments - none  In addition, I have reviewed and discussed with patient certain preventive protocols, quality metrics, and best practice recommendations. A written personalized care plan for preventive services  as well as general preventive health recommendations were provided to patient.  See attached scanned questionnaire for additional information.   Signed,  Fabio Neighbors, LPN Nurse Health Advisor   MD Recommendations: Pt needs a microalbumin test and Hgb A1c done today. Pt to receive his tetanus and flu vaccines at the [facility] this fall.

## 2017-05-28 NOTE — Progress Notes (Signed)
Anthony Craig  MRN: [id number] DOB: 1928/07/10  Subjective:  HPI  The patient is an 83 year old male who presents for 6 month follow up.  He is also followed by the [facility] and normally has his A1C done there.  He has an appointment with them next month and plans his flu shot there.  He is also scheduled for his diabetic eye exam and will ask his eye doctor to forward [place] the results of those tests.   The patient is due to have labs today.  Patient Active Problem List   Diagnosis Date Noted  . Chest pain 04/27/2015  . Allergic drug reaction 02/08/2015  . 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 B12 deficiency 02/08/2015  . 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 Chest discomfort 02/08/2015  . Diabetes ([place]) 02/08/2015  . Dizziness 02/08/2015  . Benign essential tremor 02/08/2015  . Fatigue 02/08/2015  . Acid reflux 02/08/2015  . HLD (hyperlipidemia) 02/08/2015  . BP (high blood pressure) 02/08/2015  . Hyperthyroidism 02/08/2015  . Arterial blood pressure decreased 02/08/2015  . Hernia, inguinal, left 02/08/2015  . Cannot sleep 02/08/2015  . Cancer of pancreas ([place]) 02/08/2015  . Pancreatitis 02/08/2015  . CA of prostate ([place]) 02/08/2015    Past Medical History:  Diagnosis Date  . Cancer [facility])    pancreatic  . Coronary artery disease   . Hypertension     Social History   Social History  . Marital status: Married    Spouse name: N/A  . Number of children: N/A  . Years of education: N/A   Occupational History  . Not on file.   Social History Main Topics  . Smoking status: Former Smoker    Packs/day: 0.50    Years: 30.00    Quit date: 10/06/1979  . Smokeless tobacco: Never Used  . Alcohol use No     Comment: Previously heavy drinker  . Drug use: No  . Sexual activity: Not on file   Other Topics Concern  . Not on file   Social History Narrative  . No narrative on file    Outpatient Encounter  Prescriptions as of 05/28/2017  Medication Sig Note  . ACCU-CHEK SMARTVIEW test strip CHECK SUGAR THREE TIMES DAILY   . Alcohol Swabs (B-D SINGLE USE SWABS REGULAR) PADS USE AS DIRECTED  WITH  EACH  FINGERSTICK   . aspirin EC 81 MG tablet Take 81 mg by mouth daily as needed (for chest pain).    Marland Kitchen atorvastatin (LIPITOR) 40 MG tablet TAKE 1 TABLET (40 MG TOTAL) BY MOUTH AT BEDTIME.   Marland Kitchen b complex vitamins tablet Take 1 tablet by mouth daily.   . calcium-vitamin D 250-100 MG-UNIT per tablet Take 1 tablet by mouth 2 (two) times daily.   . cholecalciferol (VITAMIN D) 1000 UNITS tablet Take 1,000 Units by mouth daily.   . Coenzyme Q10 (COQ10) 200 MG CAPS Take 1 capsule by mouth daily.   . diphenhydrAMINE (BENADRYL) 25 MG tablet Take 25 mg by mouth every 6 (six) hours as needed. 11/10/2016: prn  . diphenoxylate-atropine (LOMOTIL) 2.5-0.025 MG per tablet Take 1 tablet by mouth 4 (four) times daily as needed for diarrhea or loose stools.   Marland Kitchen HYDROcodone-acetaminophen (NORCO/VICODIN) 5-325 MG tablet Take 1-2 tablets by mouth every 4 (four) hours as needed for moderate pain. 11/10/2016: prn  . insulin aspart (NOVOLOG FLEXPEN) 100 UNIT/ML FlexPen Inject 4 Units into the skin 3 (three) times daily with  meals. 90 day supply. Generic ok. (Patient taking differently: Inject 5-6 Units into the skin 3 (three) times daily with meals. 90 day supply. Generic ok.)   . Insulin Detemir (LEVEMIR FLEXTOUCH) 100 UNIT/ML Pen Inject 60 Units into the skin daily at 10 pm.   . lipase/protease/amylase (CREON) 36000 UNITS CPEP capsule Take 36,000 Units by mouth.   . Multiple Vitamins-Minerals (MULTIVITAMIN PO) Take 1 tablet by mouth daily.   Marland Kitchen omega-3 acid ethyl esters (LOVAZA) 1 g capsule Take by mouth 2 (two) times daily.   Marland Kitchen omeprazole (PRILOSEC) 20 MG capsule Take 1 capsule (20 mg total) by mouth 2 (two) times daily.   . primidone (MYSOLINE) 50 MG tablet Take 1 tablet (50 mg total) by mouth 4 (four) times daily.   . vitamin B-12  (CYANOCOBALAMIN) 1000 MCG tablet Take 1,000 mcg by mouth daily.    No facility-administered encounter medications on file as of 05/28/2017.     Allergies  Allergen Reactions  . Doxycycline Other (See Comments)    Reaction: per [facility] patient called on 11/30/14 stating he was having a reaction to a medication possibly including redness, swelling and itching, he was not sure at that time if it was this medication or Hydrocodone-homatropin.   Marland Kitchen Hydrocodone-Homatropine Other (See Comments)    Reaction:  Reaction: per [facility] patient called on 11/30/14 stating he was having a reaction to a medication possibly including redness, swelling and itching, he was not sure at that time if it was this medication or Doxy that he was taking at the same time. Patient has been taking Hydrocodone with no problems.    Review of Systems  Constitutional: Negative for fever and malaise/fatigue.  HENT: Negative.   Eyes: Negative.   Respiratory: Negative for cough, shortness of breath and wheezing.   Cardiovascular: Negative for chest pain, palpitations and orthopnea.  Gastrointestinal: Negative.   Skin: Negative.   Neurological: Negative for dizziness, weakness and headaches.  Psychiatric/Behavioral: Negative.     Objective:  BP 128/66   Pulse 64   Temp (!) 97.4 F (36.3 C) (Oral)   Ht 5\' 11"  (1.803 m)   Wt 150 lb (68 kg)   BMI 20.92 kg/m   Physical Exam  Constitutional: He is oriented to person, place, and time and well-developed, well-nourished, and in no distress.  HENT:  Head: Normocephalic and atraumatic.  Eyes: Conjunctivae are normal. No scleral icterus.  Neck: No thyromegaly present.  Cardiovascular: Normal rate, regular rhythm and normal heart sounds.   Pulmonary/Chest: Effort normal and breath sounds normal.  Abdominal: Soft.  Neurological: He is alert and oriented to person, place, and time. GCS score is 15.  Skin: Skin is warm and dry.  Psychiatric: Mood, memory, affect and judgment  normal.    Assessment and Plan :  [facility] S/p [place] for Pancreatic cancer 2007 CAD All labs followed at [facility].  I have done the exam and reviewed the chart and it is accurate to the best of my knowledge. Development worker, community has been used and  any errors in dictation or transcription are unintentional. Miguel Aschoff M.D. [facility]

## 2017-06-04 DIAGNOSIS — R69 Illness, unspecified: Secondary | ICD-10-CM | POA: Diagnosis not present

## 2017-06-12 ENCOUNTER — Other Ambulatory Visit: Payer: Self-pay | Admitting: Emergency Medicine

## 2017-06-12 DIAGNOSIS — E7800 Pure hypercholesterolemia, unspecified: Secondary | ICD-10-CM

## 2017-06-12 MED ORDER — ATORVASTATIN CALCIUM 40 MG PO TABS: 40.0000 mg | ORAL_TABLET | Freq: Every day | ORAL | 3 refills | Status: DC

## 2017-06-15 DIAGNOSIS — R69 Illness, unspecified: Secondary | ICD-10-CM | POA: Diagnosis not present

## 2017-06-27 ENCOUNTER — Ambulatory Visit: Payer: Self-pay

## 2017-07-01 LAB — LIPID PANEL
Cholesterol: 114 (ref 0–200)
HDL: 54 (ref 35–70)
LDL Cholesterol: 58
Triglycerides: 95 (ref 40–160)

## 2017-07-08 NOTE — Telephone Encounter (Signed)
Visit completed.

## 2017-09-03 ENCOUNTER — Other Ambulatory Visit: Payer: Self-pay | Admitting: Family Medicine

## 2017-09-03 ENCOUNTER — Other Ambulatory Visit: Payer: Self-pay

## 2017-09-03 DIAGNOSIS — R69 Illness, unspecified: Secondary | ICD-10-CM | POA: Diagnosis not present

## 2017-09-03 MED ORDER — GLUCOSE BLOOD VI STRP: ORAL_STRIP | 12 refills | Status: DC

## 2017-09-03 NOTE — Telephone Encounter (Signed)
Done

## 2017-09-03 NOTE — Telephone Encounter (Signed)
Pt needs refill on his test strips for the one touch verio.  He needs 100 ct if possible  He uses [facility] [address]  Pt's call back is [phone]   Thanks teri

## 2017-09-09 NOTE — Telephone Encounter (Signed)
Pt stated that he went to get his test strips and was given 100 test strips. Pt stated that he was getting 300 strips to last him 90 days because he checks sugar 3 X a day sometimes 4. Pt is requesting a new Rx for 300 ( 90 day supply) be sent to [facility] [address]. Please advise. Thanks TNP

## 2017-09-10 MED ORDER — GLUCOSE BLOOD VI STRP: ORAL_STRIP | 3 refills | Status: DC

## 2017-09-10 NOTE — Telephone Encounter (Signed)
Patient advised as below. Will send RX as requested and advised patient insurance may not approve, will see what happens. Patient understood and agreed-Anthony Craig Estell Harpin, RMA

## 2017-09-10 NOTE — Telephone Encounter (Signed)
I am not sure insurance /medicare will cover more than 3 per day.

## 2017-10-30 DIAGNOSIS — R69 Illness, unspecified: Secondary | ICD-10-CM | POA: Diagnosis not present

## 2017-11-25 DIAGNOSIS — E1365 Other specified diabetes mellitus with hyperglycemia: Secondary | ICD-10-CM | POA: Diagnosis not present

## 2017-11-25 DIAGNOSIS — G8929 Other chronic pain: Secondary | ICD-10-CM | POA: Diagnosis not present

## 2017-11-25 DIAGNOSIS — Z8249 Family history of ischemic heart disease and other diseases of the circulatory system: Secondary | ICD-10-CM | POA: Diagnosis not present

## 2017-11-25 DIAGNOSIS — Z7982 Long term (current) use of aspirin: Secondary | ICD-10-CM | POA: Diagnosis not present

## 2017-11-25 DIAGNOSIS — G4700 Insomnia, unspecified: Secondary | ICD-10-CM | POA: Diagnosis not present

## 2017-11-25 DIAGNOSIS — R251 Tremor, unspecified: Secondary | ICD-10-CM | POA: Diagnosis not present

## 2017-11-25 DIAGNOSIS — K219 Gastro-esophageal reflux disease without esophagitis: Secondary | ICD-10-CM | POA: Diagnosis not present

## 2017-11-25 DIAGNOSIS — Z833 Family history of diabetes mellitus: Secondary | ICD-10-CM | POA: Diagnosis not present

## 2017-11-25 DIAGNOSIS — Z794 Long term (current) use of insulin: Secondary | ICD-10-CM | POA: Diagnosis not present

## 2017-11-25 DIAGNOSIS — E785 Hyperlipidemia, unspecified: Secondary | ICD-10-CM | POA: Diagnosis not present

## 2017-11-30 ENCOUNTER — Ambulatory Visit: Payer: Self-pay | Admitting: Family Medicine

## 2017-11-30 NOTE — Progress Notes (Deleted)
Patient: Anthony Craig Male    DOB: [birth year]   84 y.o.   MRN: [id number] Visit Date: 11/30/2017  Today's Provider: Wilhemena Durie, MD   No chief complaint on file.  Subjective:    HPI     Allergies  Allergen Reactions  . Doxycycline Other (See Comments)    Reaction: per [facility] patient called on 11/30/14 stating he was having a reaction to a medication possibly including redness, swelling and itching, he was not sure at that time if it was this medication or Hydrocodone-homatropin.   Marland Kitchen Hydrocodone-Homatropine Other (See Comments)    Reaction:  Reaction: per [facility] patient called on 11/30/14 stating he was having a reaction to a medication possibly including redness, swelling and itching, he was not sure at that time if it was this medication or Doxy that he was taking at the same time. Patient has been taking Hydrocodone with no problems.     Current Outpatient Medications:  .  ACCU-CHEK SMARTVIEW test strip, CHECK SUGAR THREE TIMES DAILY, Disp: 300 each, Rfl: 4 .  Alcohol Swabs (B-D SINGLE USE SWABS REGULAR) PADS, USE AS DIRECTED  WITH  EACH  FINGERSTICK, Disp: 300 each, Rfl: 3 .  aspirin EC 81 MG tablet, Take 81 mg by mouth daily as needed (for chest pain). , Disp: , Rfl:  .  atorvastatin (LIPITOR) 40 MG tablet, Take 1 tablet (40 mg total) by mouth at bedtime., Disp: 90 tablet, Rfl: 3 .  b complex vitamins tablet, Take 1 tablet by mouth daily., Disp: , Rfl:  .  calcium-vitamin D 250-100 MG-UNIT per tablet, Take 1 tablet by mouth 2 (two) times daily., Disp: , Rfl:  .  cholecalciferol (VITAMIN D) 1000 UNITS tablet, Take 1,000 Units by mouth daily., Disp: , Rfl:  .  Coenzyme Q10 (COQ10) 200 MG CAPS, Take 1 capsule by mouth daily., Disp: , Rfl:  .  diphenhydrAMINE (BENADRYL) 25 MG tablet, Take 25 mg by mouth every 6 (six) hours as needed., Disp: , Rfl:  .  diphenoxylate-atropine (LOMOTIL) 2.5-0.025 MG per tablet, Take 1 tablet by mouth 4 (four) times daily as  needed for diarrhea or loose stools., Disp: , Rfl:  .  glucose blood test strip, Check sugar 3 times daily. DX E11.9. Needs one touch verio strips, Disp: 300 each, Rfl: 3 .  HYDROcodone-acetaminophen (NORCO/VICODIN) 5-325 MG tablet, Take 1-2 tablets by mouth every 4 (four) hours as needed for moderate pain., Disp: 100 tablet, Rfl: 0 .  insulin aspart (NOVOLOG FLEXPEN) 100 UNIT/ML FlexPen, Inject 4 Units into the skin 3 (three) times daily with meals. 90 day supply. Generic ok. (Patient taking differently: Inject 5-6 Units into the skin 3 (three) times daily with meals. 90 day supply. Generic ok.), Disp: 15 mL, Rfl: 3 .  Insulin Detemir (LEVEMIR FLEXTOUCH) 100 UNIT/ML Pen, Inject 60 Units into the skin daily at 10 pm., Disp: 15 mL, Rfl: 3 .  lipase/protease/amylase (CREON) 36000 UNITS CPEP capsule, Take 36,000 Units by mouth., Disp: , Rfl:  .  Multiple Vitamins-Minerals (MULTIVITAMIN PO), Take 1 tablet by mouth daily., Disp: , Rfl:  .  omega-3 acid ethyl esters (LOVAZA) 1 g capsule, Take by mouth 2 (two) times daily., Disp: , Rfl:  .  omeprazole (PRILOSEC) 20 MG capsule, Take 1 capsule (20 mg total) by mouth 2 (two) times daily., Disp: 180 capsule, Rfl: 3 .  primidone (MYSOLINE) 50 MG tablet, Take 1 tablet (50 mg total) by mouth 4 (four) times  daily., Disp: 360 tablet, Rfl: 3 .  vitamin B-12 (CYANOCOBALAMIN) 1000 MCG tablet, Take 1,000 mcg by mouth daily., Disp: , Rfl:   Review of Systems  Constitutional: Negative for appetite change, chills and fever.  Respiratory: Negative for chest tightness, shortness of breath and wheezing.   Cardiovascular: Negative for chest pain and palpitations.  Gastrointestinal: Negative for abdominal pain, nausea and vomiting.    Social History   Tobacco Use  . Smoking status: Former Smoker    Packs/day: 0.50    Years: 30.00    Pack years: 15.00    Last attempt to quit: 10/06/1979    Years since quitting: 38.1  . Smokeless tobacco: Never Used  Substance Use  Topics  . Alcohol use: No    Comment: Previously heavy drinker   Objective:   There were no vitals taken for this visit. There were no vitals filed for this visit.   Physical Exam      Assessment & Plan:           Wilhemena Durie, MD  [facility]

## 2017-12-16 ENCOUNTER — Other Ambulatory Visit: Payer: Self-pay | Admitting: Family Medicine

## 2017-12-16 DIAGNOSIS — E7800 Pure hypercholesterolemia, unspecified: Secondary | ICD-10-CM

## 2017-12-16 NOTE — Telephone Encounter (Signed)
[facility] faxed refill request for following medications: atorvastatin (LIPITOR) 40 MG tablet     Please advise,Thanks [place]

## 2017-12-17 MED ORDER — ATORVASTATIN CALCIUM 40 MG PO TABS: 40.0000 mg | ORAL_TABLET | Freq: Every day | ORAL | 3 refills | Status: DC

## 2018-01-25 DIAGNOSIS — R69 Illness, unspecified: Secondary | ICD-10-CM | POA: Diagnosis not present

## 2018-02-08 ENCOUNTER — Other Ambulatory Visit: Payer: Self-pay | Admitting: Family Medicine

## 2018-02-08 DIAGNOSIS — K219 Gastro-esophageal reflux disease without esophagitis: Secondary | ICD-10-CM

## 2018-02-08 MED ORDER — OMEPRAZOLE 20 MG PO CPDR: 20.0000 mg | DELAYED_RELEASE_CAPSULE | Freq: Every day | ORAL | 3 refills | Status: DC

## 2018-02-08 NOTE — Telephone Encounter (Signed)
Pt needs a new prescription of generic Prilosec 20 mg  Taking 1 time a day  He uses [facility] [address]  His call back is [phone]  Thanks teri

## 2018-02-08 NOTE — Telephone Encounter (Signed)
Advised. Via [facility]. Med sent in.

## 2018-04-07 DIAGNOSIS — R69 Illness, unspecified: Secondary | ICD-10-CM | POA: Diagnosis not present

## 2018-04-07 DIAGNOSIS — R151 Fecal smearing: Secondary | ICD-10-CM | POA: Diagnosis not present

## 2018-04-07 DIAGNOSIS — N8184 Pelvic muscle wasting: Secondary | ICD-10-CM | POA: Diagnosis not present

## 2018-04-13 ENCOUNTER — Ambulatory Visit (INDEPENDENT_AMBULATORY_CARE_PROVIDER_SITE_OTHER): Payer: Medicare HMO | Admitting: Family Medicine

## 2018-04-13 VITALS — BP 142/78 | HR 80 | Temp 97.7°F | Resp 16 | Wt 153.0 lb

## 2018-04-13 DIAGNOSIS — I25812 Atherosclerosis of bypass graft of coronary artery of transplanted heart without angina pectoris: Secondary | ICD-10-CM | POA: Diagnosis not present

## 2018-04-13 DIAGNOSIS — K861 Other chronic pancreatitis: Secondary | ICD-10-CM | POA: Diagnosis not present

## 2018-04-13 DIAGNOSIS — C250 Malignant neoplasm of head of pancreas: Secondary | ICD-10-CM

## 2018-04-13 NOTE — Progress Notes (Signed)
Anthony Craig  MRN: [id number] DOB: [birth year]  Subjective:  HPI   The patient is an 84 year old male who presents for follow up of his chronic health.  He was last seen on 05/28/17.  He is also followed at the [facility] for his chronic health and they do his labwork.   The patient had his last A1C at the [place] and it was 7.7 on 03/23/18 Recurrent pancreatitis due to surgery for pancreatic cancer a decade ago. Patient Active Problem List   Diagnosis Date Noted  . Chest pain 04/27/2015  . Allergic drug reaction 02/08/2015  . 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 B12 deficiency 02/08/2015  . 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 Chest discomfort 02/08/2015  . Diabetes ([place]) 02/08/2015  . Dizziness 02/08/2015  . Benign essential tremor 02/08/2015  . Fatigue 02/08/2015  . Acid reflux 02/08/2015  . HLD (hyperlipidemia) 02/08/2015  . BP (high blood pressure) 02/08/2015  . Hyperthyroidism 02/08/2015  . Arterial blood pressure decreased 02/08/2015  . Hernia, inguinal, left 02/08/2015  . Cannot sleep 02/08/2015  . Cancer of pancreas ([place]) 02/08/2015  . Pancreatitis 02/08/2015  . CA of prostate ([place]) 02/08/2015    Past Medical History:  Diagnosis Date  . Cancer [facility])    pancreatic  . Coronary artery disease   . Hypertension     Social History   Socioeconomic History  . Marital status: Married    Spouse name: Not on file  . Number of children: Not on file  . Years of education: Not on file  . Highest education level: Not on file  Occupational History  . Not on file  Social Needs  . Financial resource strain: Not on file  . Food insecurity:    Worry: Not on file    Inability: Not on file  . Transportation needs:    Medical: Not on file    Non-medical: Not on file  Tobacco Use  . Smoking status: Former Smoker    Packs/day: 0.50    Years: 30.00    Pack years: 15.00    Last attempt to quit: 10/06/1979    Years since  quitting: 38.5  . Smokeless tobacco: Never Used  Substance and Sexual Activity  . Alcohol use: No    Comment: Previously heavy drinker  . Drug use: No  . Sexual activity: Not on file  Lifestyle  . Physical activity:    Days per week: Not on file    Minutes per session: Not on file  . Stress: Not on file  Relationships  . Social connections:    Talks on phone: Not on file    Gets together: Not on file    Attends religious service: Not on file    Active member of club or organization: Not on file    Attends meetings of clubs or organizations: Not on file    Relationship status: Not on file  . Intimate partner violence:    Fear of current or ex partner: Not on file    Emotionally abused: Not on file    Physically abused: Not on file    Forced sexual activity: Not on file  Other Topics Concern  . Not on file  Social History Narrative  . Not on file    Outpatient Encounter Medications as of 04/13/2018  Medication Sig Note  . ACCU-CHEK SMARTVIEW test strip CHECK SUGAR THREE TIMES DAILY   . Alcohol Swabs (B-D SINGLE USE SWABS  REGULAR) PADS USE AS DIRECTED  WITH  EACH  FINGERSTICK   . aspirin EC 81 MG tablet Take 81 mg by mouth daily as needed (for chest pain).    Marland Kitchen atorvastatin (LIPITOR) 40 MG tablet Take 1 tablet (40 mg total) by mouth at bedtime.   Marland Kitchen b complex vitamins tablet Take 1 tablet by mouth daily.   . calcium-vitamin D 250-100 MG-UNIT per tablet Take 1 tablet by mouth 2 (two) times daily.   . cholecalciferol (VITAMIN D) 1000 UNITS tablet Take 1,000 Units by mouth daily.   . Coenzyme Q10 (COQ10) 200 MG CAPS Take 1 capsule by mouth daily.   . diphenhydrAMINE (BENADRYL) 25 MG tablet Take 25 mg by mouth every 6 (six) hours as needed. 11/10/2016: prn  . diphenoxylate-atropine (LOMOTIL) 2.5-0.025 MG per tablet Take 1 tablet by mouth 4 (four) times daily as needed for diarrhea or loose stools.   Marland Kitchen glucose blood test strip Check sugar 3 times daily. DX E11.9. Needs one touch verio  strips   . HYDROcodone-acetaminophen (NORCO/VICODIN) 5-325 MG tablet Take 1-2 tablets by mouth every 4 (four) hours as needed for moderate pain. 11/10/2016: prn  . insulin aspart (NOVOLOG FLEXPEN) 100 UNIT/ML FlexPen Inject 4 Units into the skin 3 (three) times daily with meals. 90 day supply. Generic ok. (Patient taking differently: Inject 5-6 Units into the skin 3 (three) times daily with meals. 90 day supply. Generic ok.)   . insulin glargine (LANTUS) 100 UNIT/ML injection Inject 10 Units into the skin daily.   . lipase/protease/amylase (CREON) 36000 UNITS CPEP capsule Take 36,000 Units by mouth.   . Multiple Vitamins-Minerals (MULTIVITAMIN PO) Take 1 tablet by mouth daily.   Marland Kitchen omega-3 acid ethyl esters (LOVAZA) 1 g capsule Take by mouth 2 (two) times daily.   Marland Kitchen omeprazole (PRILOSEC) 20 MG capsule Take 1 capsule (20 mg total) by mouth daily.   . primidone (MYSOLINE) 50 MG tablet Take 1 tablet (50 mg total) by mouth 4 (four) times daily.   . vitamin B-12 (CYANOCOBALAMIN) 1000 MCG tablet Take 1,000 mcg by mouth daily.   . [DISCONTINUED] Insulin Detemir (LEVEMIR FLEXTOUCH) 100 UNIT/ML Pen Inject 60 Units into the skin daily at 10 pm.    No facility-administered encounter medications on file as of 04/13/2018.     Allergies  Allergen Reactions  . Doxycycline Other (See Comments)    Reaction: per [facility] patient called on 11/30/14 stating he was having a reaction to a medication possibly including redness, swelling and itching, he was not sure at that time if it was this medication or Hydrocodone-homatropin.   Marland Kitchen Hydrocodone-Homatropine Other (See Comments)    Reaction:  Reaction: per [facility] patient called on 11/30/14 stating he was having a reaction to a medication possibly including redness, swelling and itching, he was not sure at that time if it was this medication or Doxy that he was taking at the same time. Patient has been taking Hydrocodone with no problems.    Review of Systems    Constitutional: Negative.  Negative for fever and malaise/fatigue.  Eyes: Negative.   Respiratory: Negative for cough, shortness of breath and wheezing.   Cardiovascular: Negative.  Negative for chest pain, palpitations, orthopnea, claudication and leg swelling.  Gastrointestinal: Positive for abdominal pain.  Skin: Negative.   Neurological: Negative.   Endo/Heme/Allergies: Negative.   Psychiatric/Behavioral: Negative.     Objective:  BP (!) 142/78 (BP Location: Right Arm, Patient Position: Sitting, Cuff Size: Normal)   Pulse 80  Temp 97.7 F (36.5 C) (Oral)   Resp 16   Wt 153 lb (69.4 kg)   BMI 21.34 kg/m   Physical Exam  Constitutional: He is oriented to person, place, and time and well-developed, well-nourished, and in no distress.  HENT:  Head: Normocephalic and atraumatic.  Right Ear: External ear normal.  Left Ear: External ear normal.  Nose: Nose normal.  Eyes: Conjunctivae are normal. No scleral icterus.  Neck: No thyromegaly present.  Cardiovascular: Normal rate, regular rhythm, normal heart sounds and intact distal pulses.  Pulmonary/Chest: Effort normal and breath sounds normal.  Abdominal: Soft. There is no tenderness.  Musculoskeletal: He exhibits no edema.  Neurological: He is alert and oriented to person, place, and time. Gait normal. GCS score is 15.  Skin: Skin is warm and dry.  Psychiatric: Mood, memory, affect and judgment normal.    Assessment and Plan :   Chronic Pancreatitis Refill Norco for pain. Has procedure tomorrow at [place]. CAD All risk factors treated. [facility].  I have done the exam and reviewed the chart and it is accurate to the best of my knowledge. Development worker, community has been used and  any errors in dictation or transcription are unintentional. Miguel Aschoff M.D. [facility]

## 2018-04-16 MED ORDER — HYDROCODONE-ACETAMINOPHEN 5-325 MG PO TABS: 1.0000 | ORAL_TABLET | ORAL | 0 refills | Status: DC | PRN

## 2018-05-17 DIAGNOSIS — N8184 Pelvic muscle wasting: Secondary | ICD-10-CM | POA: Diagnosis not present

## 2018-05-17 DIAGNOSIS — R151 Fecal smearing: Secondary | ICD-10-CM | POA: Diagnosis not present

## 2018-05-17 DIAGNOSIS — R69 Illness, unspecified: Secondary | ICD-10-CM | POA: Diagnosis not present

## 2018-05-20 DIAGNOSIS — N8184 Pelvic muscle wasting: Secondary | ICD-10-CM | POA: Diagnosis not present

## 2018-05-20 DIAGNOSIS — R69 Illness, unspecified: Secondary | ICD-10-CM | POA: Diagnosis not present

## 2018-05-20 DIAGNOSIS — R151 Fecal smearing: Secondary | ICD-10-CM | POA: Diagnosis not present

## 2018-06-03 ENCOUNTER — Ambulatory Visit (INDEPENDENT_AMBULATORY_CARE_PROVIDER_SITE_OTHER): Payer: Medicare HMO

## 2018-06-03 VITALS — BP 112/68 | HR 65 | Temp 97.9°F | Ht 71.0 in | Wt 150.8 lb

## 2018-06-03 DIAGNOSIS — Z0000 Encounter for general adult medical examination without abnormal findings: Secondary | ICD-10-CM

## 2018-06-03 NOTE — Patient Instructions (Addendum)
Anthony Craig , Thank you for taking time to come for your Medicare Wellness Visit. I appreciate your ongoing commitment to your health goals. Please review the following plan we discussed and let me know if I can assist you in the future.   Screening recommendations/referrals: Colonoscopy: N/A Recommended yearly ophthalmology/optometry visit for glaucoma screening and checkup Recommended yearly dental visit for and checkup hygiene  Vaccinations: Influenza vaccine: N/A Pneumococcal vaccine: Up to date Tdap vaccine: Up to date Shingles vaccine: Pt declines today.     Advanced directives: Please bring a copy of your POA (Power of Attorney) and/or Living Will to your next appointment.   Conditions/risks identified: Recommend to continue increasing water intake to 4-6 glasses every day.   Next appointment: Pt declined scheduling a CPE for 2019.   Preventive Care 76 Years and Older, Male Preventive care refers to lifestyle choices and visits with your health care provider that can promote health and wellness. What does preventive care include?  A yearly physical exam. This is also called an annual well check.  Dental exams once or twice a year.  Routine eye exams. Ask your health care provider how often you should have your eyes checked.  Personal lifestyle choices, including:  Daily care of your teeth and gums.  Regular physical activity.  Eating a healthy diet.  Avoiding tobacco and drug use.  Limiting alcohol use.  Practicing safe sex.  Taking low doses of aspirin every day.  Taking vitamin and mineral supplements as recommended by your health care provider. What happens during an annual well check? The services and screenings done by your health care provider during your annual well check will depend on your age, overall health, lifestyle risk factors, and family history of disease. Counseling  Your health care provider may ask you questions about your:  Alcohol  use.  Tobacco use.  Drug use.  Emotional well-being.  Home and relationship well-being.  Sexual activity.  Eating habits.  History of falls.  Memory and ability to understand (cognition).  Work and work Statistician. Screening  You may have the following tests or measurements:  Height, weight, and BMI.  Blood pressure.  Lipid and cholesterol levels. These may be checked every 5 years, or more frequently if you are over 14 years old.  Skin check.  Lung cancer screening. You may have this screening every year starting at age 61 if you have a 30-pack-year history of smoking and currently smoke or have quit within the past 15 years.  Fecal occult blood test (FOBT) of the stool. You may have this test every year starting at age 65.  Flexible sigmoidoscopy or colonoscopy. You may have a sigmoidoscopy every 5 years or a colonoscopy every 10 years starting at age 6.  Prostate cancer screening. Recommendations will vary depending on your family history and other risks.  Hepatitis C blood test.  Hepatitis B blood test.  Sexually transmitted disease (STD) testing.  Diabetes screening. This is done by checking your blood sugar (glucose) after you have not eaten for a while (fasting). You may have this done every 1-3 years.  Abdominal aortic aneurysm (AAA) screening. You may need this if you are a current or former smoker.  Osteoporosis. You may be screened starting at age 68 if you are at high risk. Talk with your health care provider about your test results, treatment options, and if necessary, the need for more tests. Vaccines  Your health care provider may recommend certain vaccines, such as:  Influenza  vaccine. This is recommended every year.  Tetanus, diphtheria, and acellular pertussis (Tdap, Td) vaccine. You may need a Td booster every 10 years.  Zoster vaccine. You may need this after age 64.  Pneumococcal 13-valent conjugate (PCV13) vaccine. One dose is  recommended after age 80.  Pneumococcal polysaccharide (PPSV23) vaccine. One dose is recommended after age 63. Talk to your health care provider about which screenings and vaccines you need and how often you need them. This information is not intended to replace advice given to you by your health care provider. Make sure you discuss any questions you have with your health care provider. Document Released: 10/19/2015 Document Revised: 06/11/2016 Document Reviewed: 07/24/2015 Elsevier Interactive Patient Education  2017 [place] Prevention in the Home Falls can cause injuries. They can happen to people of all ages. There are many things you can do to make your home safe and to help prevent falls. What can I do on the outside of my home?  Regularly fix the edges of walkways and driveways and fix any cracks.  Remove anything that might make you trip as you walk through a door, such as a raised step or threshold.  Trim any bushes or trees on the path to your home.  Use bright outdoor lighting.  Clear any walking paths of anything that might make someone trip, such as rocks or tools.  Regularly check to see if handrails are loose or broken. Make sure that both sides of any steps have handrails.  Any raised decks and porches should have guardrails on the edges.  Have any leaves, snow, or ice cleared regularly.  Use sand or salt on walking paths during winter.  Clean up any spills in your garage right away. This includes oil or grease spills. What can I do in the bathroom?  Use night lights.  Install grab bars by the toilet and in the tub and shower. Do not use towel bars as grab bars.  Use non-skid mats or decals in the tub or shower.  If you need to sit down in the shower, use a plastic, non-slip stool.  Keep the floor dry. Clean up any water that spills on the floor as soon as it happens.  Remove soap buildup in the tub or shower regularly.  Attach bath mats  securely with double-sided non-slip rug tape.  Do not have throw rugs and other things on the floor that can make you trip. What can I do in the bedroom?  Use night lights.  Make sure that you have a light by your bed that is easy to reach.  Do not use any sheets or blankets that are too big for your bed. They should not hang down onto the floor.  Have a firm chair that has side arms. You can use this for support while you get dressed.  Do not have throw rugs and other things on the floor that can make you trip. What can I do in the kitchen?  Clean up any spills right away.  Avoid walking on wet floors.  Keep items that you use a lot in easy-to-reach places.  If you need to reach something above you, use a strong step stool that has a grab bar.  Keep electrical cords out of the way.  Do not use floor polish or wax that makes floors slippery. If you must use wax, use non-skid floor wax.  Do not have throw rugs and other things on the floor that can  make you trip. What can I do with my stairs?  Do not leave any items on the stairs.  Make sure that there are handrails on both sides of the stairs and use them. Fix handrails that are broken or loose. Make sure that handrails are as long as the stairways.  Check any carpeting to make sure that it is firmly attached to the stairs. Fix any carpet that is loose or worn.  Avoid having throw rugs at the top or bottom of the stairs. If you do have throw rugs, attach them to the floor with carpet tape.  Make sure that you have a light switch at the top of the stairs and the bottom of the stairs. If you do not have them, ask someone to add them for you. What else can I do to help prevent falls?  Wear shoes that:  Do not have high heels.  Have rubber bottoms.  Are comfortable and fit you well.  Are closed at the toe. Do not wear sandals.  If you use a stepladder:  Make sure that it is fully opened. Do not climb a closed  stepladder.  Make sure that both sides of the stepladder are locked into place.  Ask someone to hold it for you, if possible.  Clearly mark and make sure that you can see:  Any grab bars or handrails.  First and last steps.  Where the edge of each step is.  Use tools that help you move around (mobility aids) if they are needed. These include:  Canes.  Walkers.  Scooters.  Crutches.  Turn on the lights when you go into a dark area. Replace any light bulbs as soon as they burn out.  Set up your furniture so you have a clear path. Avoid moving your furniture around.  If any of your floors are uneven, fix them.  If there are any pets around you, be aware of where they are.  Review your medicines with your doctor. Some medicines can make you feel dizzy. This can increase your chance of falling. Ask your doctor what other things that you can do to help prevent falls. This information is not intended to replace advice given to you by your health care provider. Make sure you discuss any questions you have with your health care provider. Document Released: 07/19/2009 Document Revised: 02/28/2016 Document Reviewed: 10/27/2014 Elsevier Interactive Patient Education  2017 [employer].

## 2018-06-03 NOTE — Progress Notes (Signed)
Subjective:   Anthony Craig is a 84 y.o. male who presents for Medicare Annual/Subsequent preventive examination.  Review of Systems:  N/A  Cardiac Risk Factors include: advanced age (>67men, >27 women);diabetes mellitus;dyslipidemia;hypertension;male gender     Objective:    Vitals: BP 112/68 (BP Location: Right Arm)   Pulse 65   Temp 97.9 F (36.6 C) (Oral)   Ht 5\' 11"  (1.803 m)   Wt 150 lb 12.8 oz (68.4 kg)   BMI 21.03 kg/m   Body mass index is 21.03 kg/m.  Advanced Directives 06/03/2018 05/28/2017 09/18/2015 04/30/2015 04/27/2015 04/27/2015  Does Patient Have a Medical Advance Directive? Yes Yes Yes - Yes No  Type of Advance Directive [place];Living will Living will;Healthcare Power of [place];Living will Living will Living will -  Does patient want to make changes to medical advance directive? - - - - No - Patient declined -  Copy of [place] in Chart? No - copy requested No - copy requested - - No - copy requested -  Would patient like information on creating a medical advance directive? - - - - No - patient declined information No - patient declined information    Tobacco Social History   Tobacco Use  Smoking Status Former Smoker  . Packs/day: 0.50  . Years: 30.00  . Pack years: 15.00  . Last attempt to quit: 10/06/1979  . Years since quitting: 38.6  Smokeless Tobacco Never Used     Counseling given: Not Answered   Clinical Intake:  Pre-visit preparation completed: Yes  Pain : No/denies pain Pain Score: 0-No pain     Nutritional Status: BMI of 19-24  Normal Nutritional Risks: None Diabetes: Yes(type 2) CBG done?: No Did pt. bring in CBG monitor from home?: No  How often do you need to have someone help you when you read instructions, pamphlets, or other written materials from your doctor or pharmacy?: 1 - Never  Interpreter Needed?: No  Information entered by ::  [facility], LPN  Past Medical History:  Diagnosis Date  . Cancer [facility])    pancreatic  . Coronary artery disease   . Diabetes mellitus without complication ([place])    type 2  . Hyperlipidemia   . Hypertension    Past Surgical History:  Procedure Laterality Date  . APPENDECTOMY     1977  . BILIARY DRAINAGE    . CORONARY ARTERY BYPASS GRAFT     2001-3 vessel  . HERNIA REPAIR Bilateral    2016-left, 2009-right  . WHIPPLE PROCEDURE  2007   Family History  Problem Relation Age of Onset  . Heart disease Mother   . Cancer Mother 17       breast  . Heart disease Father   . COPD Brother   . Stroke Brother   . Diabetes Other        1 and 2   Social History   Socioeconomic History  . Marital status: Married    Spouse name: Not on file  . Number of children: 0  . Years of education: Not on file  . Highest education level: Some college, no degree  Occupational History  . Occupation: retired  Scientific laboratory technician  . Financial resource strain: Not hard at all  . Food insecurity:    Worry: Never true    Inability: Never true  . Transportation needs:    Medical: No    Non-medical: No  Tobacco Use  . Smoking  status: Former Smoker    Packs/day: 0.50    Years: 30.00    Pack years: 15.00    Last attempt to quit: 10/06/1979    Years since quitting: 38.6  . Smokeless tobacco: Never Used  Substance and Sexual Activity  . Alcohol use: No    Comment: Maybe a beer or 2 a month  . Drug use: No  . Sexual activity: Not on file  Lifestyle  . Physical activity:    Days per week: Not on file    Minutes per session: Not on file  . Stress: Not at all  Relationships  . Social connections:    Talks on phone: Not on file    Gets together: Not on file    Attends religious service: Not on file    Active member of club or organization: Not on file    Attends meetings of clubs or organizations: Not on file    Relationship status: Not on file  Other Topics Concern  . Not on file  Social  History Narrative  . Not on file    Outpatient Encounter Medications as of 06/03/2018  Medication Sig  . ACCU-CHEK SMARTVIEW test strip CHECK SUGAR THREE TIMES DAILY  . Alcohol Swabs (B-D SINGLE USE SWABS REGULAR) PADS USE AS DIRECTED  WITH  EACH  FINGERSTICK  . aspirin EC 81 MG tablet Take 81 mg by mouth daily as needed (for chest pain).   Marland Kitchen atorvastatin (LIPITOR) 40 MG tablet Take 1 tablet (40 mg total) by mouth at bedtime.  Marland Kitchen b complex vitamins tablet Take 1 tablet by mouth daily.  . calcium-vitamin D 250-100 MG-UNIT per tablet Take 1 tablet by mouth 2 (two) times daily.  . Coenzyme Q10 (COQ10) 200 MG CAPS Take 1 capsule by mouth daily.  . diphenhydrAMINE (BENADRYL) 25 MG tablet Take 25 mg by mouth every 6 (six) hours as needed.  . diphenoxylate-atropine (LOMOTIL) 2.5-0.025 MG per tablet Take 1 tablet by mouth 4 (four) times daily as needed for diarrhea or loose stools.  . fluocinonide (LIDEX) 0.05 % external solution Apply 1 application topically daily. To affected areas of scalp  . glucose blood test strip Check sugar 3 times daily. DX E11.9. Needs one touch verio strips  . HYDROcodone-acetaminophen (NORCO/VICODIN) 5-325 MG tablet Take 1-2 tablets by mouth every 4 (four) hours as needed for moderate pain.  . hydrocortisone 2.5 % lotion Apply 1 application topically 2 (two) times daily. To affected areas of face only as needed for flares of itching/scaling  . hydroxypropyl methylcellulose / hypromellose (ISOPTO TEARS / GONIOVISC) 2.5 % ophthalmic solution Place 1 drop into both eyes 4 (four) times daily as needed for dry eyes.  . insulin aspart (NOVOLOG FLEXPEN) 100 UNIT/ML FlexPen Inject 4 Units into the skin 3 (three) times daily with meals. 90 day supply. Generic ok. (Patient taking differently: Inject 6 Units into the skin 3 (three) times daily with meals. 90 day supply. Generic ok.)  . insulin glargine (LANTUS) 100 UNIT/ML injection Inject 10 Units into the skin daily.  .  lipase/protease/amylase (CREON) 36000 UNITS CPEP capsule Take 36,000 Units by mouth.  . Multiple Vitamins-Minerals (MULTIVITAMIN PO) Take 1 tablet by mouth daily.  Marland Kitchen omega-3 acid ethyl esters (LOVAZA) 1 g capsule Take by mouth 2 (two) times daily.  Marland Kitchen omeprazole (PRILOSEC) 20 MG capsule Take 1 capsule (20 mg total) by mouth daily.  . primidone (MYSOLINE) 50 MG tablet Take 1 tablet (50 mg total) by mouth 4 (four) times daily. (  Patient taking differently: Take 50 mg by mouth at bedtime. )  . SODIUM CHLORIDE, GU IRRIGANT, IR Irrigate with as directed. Irrigant every day flush drain once daily with 10 ML saline solution  . vitamin B-12 (CYANOCOBALAMIN) 1000 MCG tablet Take 1,000 mcg by mouth daily.  . cholecalciferol (VITAMIN D) 1000 UNITS tablet Take 1,000 Units by mouth daily.   No facility-administered encounter medications on file as of 06/03/2018.     Activities of Daily Living In your present state of health, do you have any difficulty performing the following activities: 06/03/2018  Hearing? 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   Vision? N  Difficulty concentrating or making decisions? N  Walking or climbing stairs? N  Dressing or bathing? N  Doing errands, shopping? N  Preparing Food and eating ? N  Using the Toilet? N  In the past six months, have you accidently leaked urine? N  Do you have problems with loss of bowel control? N  Managing your Medications? N  Managing your Finances? N  Some recent data might be hidden    Patient Care Team: Jerrol Banana., MD as PCP - General (Family Medicine) Benito Mccreedy, MD as Referring Physician (Family Medicine)   Assessment:   This is a routine wellness examination for Anthony Craig.  Exercise Activities and Dietary recommendations Current Exercise Habits: Home exercise routine, Type of exercise: treadmill;walking;Other - see comments(stationary bike), Time (Minutes): 30(to 45 mins), Frequency  (Times/Week): 3, Weekly Exercise (Minutes/Week): 90, Intensity: Mild, Exercise limited by: Other - see comments(biliary drain)  Goals    . water intake     Recommend increasing water intake to 4-6 glasses of water a day.       Fall Risk Fall Risk  06/03/2018 05/28/2017 05/29/2016 11/28/2015  Falls in the past year? No No Yes No  Number falls in past yr: - - 1 -  Injury with Fall? - - No -   Is the patient's home free of loose throw rugs in walkways, pet beds, electrical cords, etc?   yes      Grab bars in the bathroom? yes      Handrails on the stairs?   no      Adequate lighting?   yes  Timed Get Up and Go Performed: N/A  Depression Screen PHQ 2/9 Scores 06/03/2018 06/03/2018 05/28/2017 05/29/2016  PHQ - 2 Score 1 1 0 0  PHQ- 9 Score - 2 - -    Cognitive Function: Pt declined screening today.      6CIT Screen 05/28/2017  What Year? 0 points  What month? 0 points  What time? 0 points  Count back from 20 0 points  Months in reverse 0 points  Repeat phrase 0 points  Total Score 0    Immunization History  Administered Date(s) Administered  . Influenza,inj,Quad PF,6-35 Mos 07/01/2017  . Influenza-Unspecified 07/09/2015  . Pneumococcal Conjugate-13 11/14/2013  . Pneumococcal Polysaccharide-23 06/16/2007, 06/08/2010  . Td 06/08/2006  . Tdap 07/01/2017    Qualifies for Shingles Vaccine? Due for Shingles vaccine. Declined my offer to administer today. Education has been provided regarding the importance of this vaccine. Pt has been advised to call her insurance company to determine her out of pocket expense. Advised she may also receive this vaccine at her local pharmacy or Health Dept. Verbalized acceptance and understanding.  Screening Tests Health Maintenance  Topic Date Due  . URINE MICROALBUMIN  06/23/1938  .  HEMOGLOBIN A1C  09/08/2017  . FOOT EXAM  02/03/2018  . INFLUENZA VACCINE  05/06/2018  . OPHTHALMOLOGY EXAM  09/20/2018 (Originally 06/06/2017)  . TETANUS/TDAP   07/02/2027  . PNA vac Low Risk Adult  Completed   Cancer Screenings: Lung: Low Dose CT Chest recommended if Age 79-80 years, 30 pack-year currently smoking OR have quit w/in 15years. Patient does not qualify. Colorectal: N/A  Additional Screenings:  Hepatitis C Screening: N/A      Plan:  I have personally reviewed and addressed the Medicare Annual Wellness questionnaire and have noted the following in the patient's chart:  A. Medical and social history B. Use of alcohol, tobacco or illicit drugs  C. Current medications and supplements D. Functional ability and status E.  Nutritional status F.  Physical activity G. Advance directives H. List of other physicians I.  Hospitalizations, surgeries, and ER visits in previous 12 months J.  [place] such as hearing and vision if needed, cognitive and depression L. Referrals and appointments - none  In addition, I have reviewed and discussed with patient certain preventive protocols, quality metrics, and best practice recommendations. A written personalized care plan for preventive services as well as general preventive health recommendations were provided to patient.  See attached scanned questionnaire for additional information.   Signed,  Fabio Neighbors, LPN Nurse Health Advisor   Nurse Recommendations: Pt states he goes to the [place] and they manage his diabetes and eye exams. Pt to bring in records from [place] to update HM.

## 2018-07-05 DIAGNOSIS — R151 Fecal smearing: Secondary | ICD-10-CM | POA: Diagnosis not present

## 2018-07-05 DIAGNOSIS — N8184 Pelvic muscle wasting: Secondary | ICD-10-CM | POA: Diagnosis not present

## 2018-07-05 DIAGNOSIS — R69 Illness, unspecified: Secondary | ICD-10-CM | POA: Diagnosis not present

## 2018-08-31 DIAGNOSIS — R69 Illness, unspecified: Secondary | ICD-10-CM | POA: Diagnosis not present

## 2018-12-25 ENCOUNTER — Other Ambulatory Visit: Payer: Self-pay | Admitting: Family Medicine

## 2018-12-25 DIAGNOSIS — E7800 Pure hypercholesterolemia, unspecified: Secondary | ICD-10-CM

## 2019-01-02 ENCOUNTER — Other Ambulatory Visit: Payer: Self-pay

## 2019-01-02 ENCOUNTER — Emergency Department
Admission: EM | Admit: 2019-01-02 | Discharge: 2019-01-02 | Disposition: A | Discharge status: Home / Self Care | Payer: Medicare HMO | Attending: Emergency Medicine | Admitting: Emergency Medicine

## 2019-01-02 ENCOUNTER — Encounter: Payer: Self-pay | Admitting: Emergency Medicine

## 2019-01-02 DIAGNOSIS — Z8507 Personal history of malignant neoplasm of pancreas: Secondary | ICD-10-CM | POA: Insufficient documentation

## 2019-01-02 DIAGNOSIS — R945 Abnormal results of liver function studies: Secondary | ICD-10-CM | POA: Insufficient documentation

## 2019-01-02 DIAGNOSIS — R748 Abnormal levels of other serum enzymes: Secondary | ICD-10-CM

## 2019-01-02 DIAGNOSIS — R7989 Other specified abnormal findings of blood chemistry: Secondary | ICD-10-CM

## 2019-01-02 DIAGNOSIS — E119 Type 2 diabetes mellitus without complications: Secondary | ICD-10-CM | POA: Diagnosis not present

## 2019-01-02 DIAGNOSIS — Z87891 Personal history of nicotine dependence: Secondary | ICD-10-CM | POA: Diagnosis not present

## 2019-01-02 DIAGNOSIS — R1011 Right upper quadrant pain: Secondary | ICD-10-CM | POA: Diagnosis not present

## 2019-01-02 DIAGNOSIS — I2510 Atherosclerotic heart disease of native coronary artery without angina pectoris: Secondary | ICD-10-CM | POA: Insufficient documentation

## 2019-01-02 DIAGNOSIS — R1013 Epigastric pain: Secondary | ICD-10-CM | POA: Diagnosis not present

## 2019-01-02 DIAGNOSIS — Z794 Long term (current) use of insulin: Secondary | ICD-10-CM | POA: Insufficient documentation

## 2019-01-02 DIAGNOSIS — Z951 Presence of aortocoronary bypass graft: Secondary | ICD-10-CM | POA: Insufficient documentation

## 2019-01-02 DIAGNOSIS — I10 Essential (primary) hypertension: Secondary | ICD-10-CM | POA: Diagnosis not present

## 2019-01-02 DIAGNOSIS — R1010 Upper abdominal pain, unspecified: Secondary | ICD-10-CM

## 2019-01-02 DIAGNOSIS — Z79899 Other long term (current) drug therapy: Secondary | ICD-10-CM | POA: Insufficient documentation

## 2019-01-02 LAB — CBC
HCT: 40.7 % (ref 39.0–52.0)
Hemoglobin: 12.7 g/dL — ABNORMAL LOW (ref 13.0–17.0)
MCH: 24.9 pg — ABNORMAL LOW (ref 26.0–34.0)
MCHC: 31.2 g/dL (ref 30.0–36.0)
MCV: 79.6 fL — ABNORMAL LOW (ref 80.0–100.0)
Platelets: 166 10*3/uL (ref 150–400)
RBC: 5.11 MIL/uL (ref 4.22–5.81)
RDW: 15.7 % — ABNORMAL HIGH (ref 11.5–15.5)
WBC: 8.3 10*3/uL (ref 4.0–10.5)
nRBC: 0 % (ref 0.0–0.2)

## 2019-01-02 LAB — COMPREHENSIVE METABOLIC PANEL
ALT: 139 U/L — ABNORMAL HIGH (ref 0–44)
AST: 296 U/L — AB (ref 15–41)
Albumin: 4.2 g/dL (ref 3.5–5.0)
Alkaline Phosphatase: 125 U/L (ref 38–126)
Anion gap: 8 (ref 5–15)
BUN: 22 mg/dL (ref 8–23)
CO2: 26 mmol/L (ref 22–32)
Calcium: 9.3 mg/dL (ref 8.9–10.3)
Chloride: 99 mmol/L (ref 98–111)
Creatinine, Ser: 0.99 mg/dL (ref 0.61–1.24)
GFR calc Af Amer: >60 mL/min (ref >60)
GFR calc non Af Amer: >60 mL/min (ref >60)
GLUCOSE: 353 mg/dL — AB (ref 70–99)
Potassium: 4.2 mmol/L (ref 3.5–5.1)
Sodium: 133 mmol/L — ABNORMAL LOW (ref 135–145)
Total Bilirubin: 0.6 mg/dL (ref 0.3–1.2)
Total Protein: 7.9 g/dL (ref 6.5–8.1)

## 2019-01-02 LAB — TROPONIN I: Troponin I: <0.03 ng/mL (ref <0.03)

## 2019-01-02 LAB — LIPASE, BLOOD: Lipase: 57 U/L — ABNORMAL HIGH (ref 11–51)

## 2019-01-02 MED ORDER — SODIUM CHLORIDE 0.9% FLUSH
3.0000 mL | Freq: Once | INTRAVENOUS | Status: AC
  Administered 2019-01-02: 3 mL via INTRAVENOUS

## 2019-01-02 NOTE — ED Provider Notes (Signed)
[facility] Emergency Department Provider Note  ____________________________________________   First MD Initiated Contact with Patient 01/02/19 [phone]     (approximate)  I have reviewed the triage vital signs and the nursing notes.   HISTORY  Chief Complaint Abdominal Pain    HPI Anthony Craig is a 85 y.o. male with medical history as listed below with extensive past medical history that includes pancreatic cancer status post Whipple procedure at [facility]  about 12 years ago and a prior coronary artery bypass.  He presents for evaluation of acute onset upper abdominal pain tonight that feels similar to some of the prior pain he experienced.  He said that he was fine for many years after the procedure but he is started having pain in his right upper quadrant again.  He was found about 6 months ago to have a biliary stricture and had a biliary drain for a while.  That all resolved about 3 months ago after treatment.  He has been doing well.  He denies fever/chills, chest pain, shortness of breath, nausea, vomiting, lower abdominal pain, and diarrhea.  The symptoms were severe and unrelieved with oral pain medicine at home so he came to the emergency department.  However, since arrival, the pain is completely resolved and he feels back to normal.  He has not had any respiratory symptoms and no COVID-19 exposures and no travel.        Past Medical History:  Diagnosis Date  . Cancer [facility])    pancreatic  . Coronary artery disease   . Diabetes mellitus without complication ([place])    type 2  . Hyperlipidemia   . Hypertension     Patient Active Problem List   Diagnosis Date Noted  . Chest pain 04/27/2015  . Allergic drug reaction 02/08/2015  . 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 B12 deficiency 02/08/2015  . 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 Chest discomfort 02/08/2015  . Diabetes ([place])  02/08/2015  . Dizziness 02/08/2015  . Benign essential tremor 02/08/2015  . Fatigue 02/08/2015  . Acid reflux 02/08/2015  . HLD (hyperlipidemia) 02/08/2015  . BP (high blood pressure) 02/08/2015  . Hyperthyroidism 02/08/2015  . Hernia, inguinal, left 02/08/2015  . Cannot sleep 02/08/2015  . Cancer of pancreas ([place]) 02/08/2015  . Pancreatitis 02/08/2015  . CA of prostate ([place]) 02/08/2015    Past Surgical History:  Procedure Laterality Date  . APPENDECTOMY     1977  . BILIARY DRAINAGE    . CORONARY ARTERY BYPASS GRAFT     2001-3 vessel  . HERNIA REPAIR Bilateral    2016-left, 2009-right  . WHIPPLE PROCEDURE  2007    Prior to Admission medications   Medication Sig Start Date End Date Taking? Authorizing Provider  ACCU-CHEK SMARTVIEW test strip CHECK SUGAR THREE TIMES DAILY 04/28/16   Chrismon, Vickki Muff, PA  Alcohol Swabs (B-D SINGLE USE SWABS REGULAR) PADS USE AS DIRECTED  WITH  EACH  FINGERSTICK 12/24/15   Jerrol Banana., MD  aspirin EC 81 MG tablet Take 81 mg by mouth daily as needed (for chest pain).     [provider]  atorvastatin (LIPITOR) 40 MG tablet TAKE 1 TABLET BY MOUTH EVERYDAY AT BEDTIME 12/26/18   Jerrol Banana., MD  b complex vitamins tablet Take 1 tablet by mouth daily.    [provider]  calcium-vitamin D 250-100 MG-UNIT per tablet Take 1 tablet by mouth 2 (  two) times daily.    [provider]  cholecalciferol (VITAMIN D) 1000 UNITS tablet Take 1,000 Units by mouth daily.    [provider]  Coenzyme Q10 (COQ10) 200 MG CAPS Take 1 capsule by mouth daily.    [provider]  diphenhydrAMINE (BENADRYL) 25 MG tablet Take 25 mg by mouth every 6 (six) hours as needed.    [provider]  diphenoxylate-atropine (LOMOTIL) 2.5-0.025 MG per tablet Take 1 tablet by mouth 4 (four) times daily as needed for diarrhea or loose stools.    [provider]  fluocinonide (LIDEX) 0.05 % external solution  Apply 1 application topically daily. To affected areas of scalp    [provider]  glucose blood test strip Check sugar 3 times daily. DX E11.9. Needs one touch verio strips 09/10/17   Jerrol Banana., MD  HYDROcodone-acetaminophen (NORCO/VICODIN) 5-325 MG tablet Take 1-2 tablets by mouth every 4 (four) hours as needed for moderate pain. 04/16/18   Jerrol Banana., MD  hydrocortisone 2.5 % lotion Apply 1 application topically 2 (two) times daily. To affected areas of face only as needed for flares of itching/scaling    [provider]  hydroxypropyl methylcellulose / hypromellose (ISOPTO TEARS / GONIOVISC) 2.5 % ophthalmic solution Place 1 drop into both eyes 4 (four) times daily as needed for dry eyes.    [provider]  insulin aspart (NOVOLOG FLEXPEN) 100 UNIT/ML FlexPen Inject 4 Units into the skin 3 (three) times daily with meals. 90 day supply. Generic ok. Patient taking differently: Inject 6 Units into the skin 3 (three) times daily with meals. 90 day supply. Generic ok. 01/21/16   Jerrol Banana., MD  insulin glargine (LANTUS) 100 UNIT/ML injection Inject 10 Units into the skin daily.    [provider]  lipase/protease/amylase (CREON) 36000 UNITS CPEP capsule Take 36,000 Units by mouth.    [provider]  Multiple Vitamins-Minerals (MULTIVITAMIN PO) Take 1 tablet by mouth daily.    [provider]  omega-3 acid ethyl esters (LOVAZA) 1 g capsule Take by mouth 2 (two) times daily.    [provider]  omeprazole (PRILOSEC) 20 MG capsule Take 1 capsule (20 mg total) by mouth daily. 02/08/18   Jerrol Banana., MD  primidone (MYSOLINE) 50 MG tablet Take 1 tablet (50 mg total) by mouth 4 (four) times daily. Patient taking differently: Take 50 mg by mouth at bedtime.  12/02/16   Jerrol Banana., MD  SODIUM CHLORIDE, GU IRRIGANT, IR Irrigate with as directed. Irrigant every day flush drain once daily with 10  ML saline solution    [provider]  vitamin B-12 (CYANOCOBALAMIN) 1000 MCG tablet Take 1,000 mcg by mouth daily.    [provider]    Allergies Doxycycline and Hydrocodone-homatropine  Family History  Problem Relation Age of Onset  . Heart disease Mother   . Cancer Mother 13       breast  . Heart disease Father   . COPD Brother   . Stroke Brother   . Diabetes Other        1 and 2    Social History Social History   Tobacco Use  . Smoking status: Former Smoker    Packs/day: 0.50    Years: 30.00    Pack years: 15.00    Last attempt to quit: 10/06/1979    Years since quitting: 39.2  . Smokeless tobacco: Never Used  Substance Use  Topics  . Alcohol use: No    Comment: Maybe a beer or 2 a month  . Drug use: No    Review of Systems Constitutional: No fever/chills Eyes: No visual changes. ENT: No sore throat. Cardiovascular: Denies chest pain. Respiratory: Denies shortness of breath. Gastrointestinal: Upper abdominal pain as described above.  No nausea, no vomiting.  No diarrhea.  No constipation. Genitourinary: Negative for dysuria. Musculoskeletal: Negative for neck pain.  Negative for back pain. Integumentary: Negative for rash. Neurological: Negative for headaches, focal weakness or numbness.   ____________________________________________   PHYSICAL EXAM:  VITAL SIGNS: ED Triage Vitals  Enc Vitals Group     BP 01/02/19 0347 (!) 153/70     Pulse Rate 01/02/19 0347 (!) 113     Resp 01/02/19 0347 20     Temp 01/02/19 0347 (!) 97.5 F (36.4 C)     Temp Source 01/02/19 0347 Oral     SpO2 01/02/19 0347 94 %     Weight 01/02/19 0343 68 kg (150 lb)     Height 01/02/19 0343 1.803 m (5\' 11" )     Head Circumference --      Peak Flow --      Pain Score 01/02/19 0343 10     Pain Loc --      Pain Edu? --      Excl. in [place]? --     Constitutional: Alert and oriented. Well appearing and in no acute distress. Eyes: Conjunctivae are normal.   Head: Atraumatic. Nose: No congestion/rhinnorhea. Mouth/Throat: Mucous membranes are moist. Neck: No stridor.  No meningeal signs.   Cardiovascular: Normal rate, regular rhythm. Good peripheral circulation. Grossly normal heart sounds.  Respiratory: Normal respiratory effort.  No retractions. Lungs CTAB. Gastrointestinal: Soft and nondistended.  No tenderness to palpation of the abdomen including in the epigastrium and right upper quadrant.  No tenderness at McBurney's point.  The patient has well-healed surgical scars.  He has an implanted blood glucose monitor.  Musculoskeletal: No lower extremity tenderness nor edema. No gross deformities of extremities. Neurologic:  Normal speech and language. No gross focal neurologic deficits are appreciated.  Skin:  Skin is warm, dry and intact. No rash noted.   ____________________________________________   LABS (all labs ordered are listed, but only abnormal results are displayed)  Labs Reviewed  LIPASE, BLOOD - Abnormal; Notable for the following components:      Result Value   Lipase 57 (*)    All other components within normal limits  COMPREHENSIVE METABOLIC PANEL - Abnormal; Notable for the following components:   Sodium 133 (*)    Glucose, Bld 353 (*)    AST 296 (*)    ALT 139 (*)    All other components within normal limits  CBC - Abnormal; Notable for the following components:   Hemoglobin 12.7 (*)    MCV 79.6 (*)    MCH 24.9 (*)    RDW 15.7 (*)    All other components within normal limits  TROPONIN I  URINALYSIS, COMPLETE (UACMP) WITH MICROSCOPIC   ____________________________________________  EKG  ED ECG REPORT I, Hinda Kehr, the attending physician, personally viewed and interpreted this ECG.  Date: 01/02/2019 EKG Time: 3:53 AM Rate: 95 Rhythm: Sinus rhythm with first-degree AV block QRS Axis: normal Intervals: Right bundle branch block.  PR interval is 246 ms. ST/T Wave abnormalities: Non-specific ST segment /  T-wave changes, but no clear evidence of acute ischemia. Narrative Interpretation: no definitive evidence of acute ischemia; does  not meet STEMI criteria.   ____________________________________________  RADIOLOGY   ED MD interpretation: Patient declined CT scan  Official radiology report(s): No results found.  ____________________________________________   PROCEDURES   Procedure(s) performed (including Critical Care):  Procedures   ____________________________________________   INITIAL IMPRESSION / MDM / [place] / ED COURSE  As part of my medical decision making, I reviewed the following data within the [place] notes reviewed and incorporated, Labs reviewed , EKG interpreted , Old EKG reviewed, Old chart reviewed, Notes from prior ED visits and [place] Controlled Substance Database  Beck Cofer was evaluated in Emergency Department on 01/02/2019 for the symptoms described in the history of present illness. He was evaluated in the context of the global COVID-19 pandemic, which necessitated consideration that the patient might be at risk for infection with the SARS-CoV-2 virus that causes COVID-19. Institutional protocols and algorithms that pertain to the evaluation of patients at risk for COVID-19 are in a state of rapid change based on information released by regulatory bodies including the CDC and federal and state organizations. These policies and algorithms were followed during the patient's care in the ED.      Differential diagnosis includes, but is not limited to, biliary stricture, acute hepatitis, pancreatitis, recurrent pancreatic cancer, SBO/ileus.  The patient is in no distress and was sleeping comfortably when I saw him.  He awakened easily to soft voice.  He has no tenderness to palpation of the abdomen at this time.  His lab work is generally reassuring although he does have a mild LFT elevation and a lipase that is just  barely above the upper limit of normal.  No leukocytosis.  Stable vital signs.  I offered to get a CT scan of the patient's abdomen and pelvis but I pointed out that since all of his care seems to have been at the [place], it may be difficult to compare against prior imaging studies.  He said that he feels much better and would like to go home and get some rest.  He knows that he needs to follow-up as an outpatient with his regular doctors and I gave strict return precautions if he were to develop new or worsening symptoms.  His bilirubin is normal at this time and I believe that there is no evidence of an acute or emergent condition that would keep him in the hospital.  But he knows it is very important to follow-up.     ____________________________________________  FINAL CLINICAL IMPRESSION(S) / ED DIAGNOSES  Final diagnoses:  Upper abdominal pain  Elevated LFTs  Elevated lipase     MEDICATIONS GIVEN DURING THIS VISIT:  Medications  sodium chloride flush (NS) 0.9 % injection 3 mL (3 mLs Intravenous Given 01/02/19 0430)     ED Discharge Orders    None       Note:  This document was prepared using Dragon voice recognition software and may include unintentional dictation errors.   Hinda Kehr, MD 01/02/19 [phone]

## 2019-01-02 NOTE — Discharge Instructions (Addendum)
You have been seen in the Emergency Department (ED) for abdominal pain.  Your evaluation did not identify a clear cause of your symptoms but was generally reassuring.  We offered to obtain a CT scan tonight but given that your pain has resolved, we understand it is your preference to follow-up as an outpatient with your specialist(s) at the [facility].  Please call today to schedule the next available appointment, even if it is just a telemedicine evaluation.  You can let them know that your lipase was just barely above the upper limit of normal and that your liver function tests were slightly elevated, but you had no tenderness to palpation on exam and you were feeling much better.  Please take your regular medications including your insulin and follow-up with your doctor at the next available opportunity.  Return immediately to the nearest emergency department if you develop any new or worsening symptoms that concern you.  Please follow up as instructed above regarding todays emergent visit and the symptoms that are bothering you.  Return to the ED if your abdominal pain worsens or fails to improve, you develop bloody vomiting, bloody diarrhea, you are unable to tolerate fluids due to vomiting, fever greater than 101, or other symptoms that concern you.

## 2019-01-02 NOTE — ED Triage Notes (Signed)
Pt arrives POV to triage with c/o upper abdominal pain since 0130 this AM. Pt is a pancreatic cancer survivor and has left sided abdominal CBG patch. His most recent number is 322 CBG. Pt is c/o nausea at this time.

## 2019-02-01 DIAGNOSIS — R69 Illness, unspecified: Secondary | ICD-10-CM | POA: Diagnosis not present

## 2019-02-16 ENCOUNTER — Ambulatory Visit (INDEPENDENT_AMBULATORY_CARE_PROVIDER_SITE_OTHER): Payer: Medicare HMO | Admitting: Family Medicine

## 2019-02-16 ENCOUNTER — Other Ambulatory Visit: Payer: Self-pay

## 2019-02-16 ENCOUNTER — Encounter: Payer: Self-pay | Admitting: Family Medicine

## 2019-02-16 VITALS — BP 126/68 | HR 69 | Temp 97.5°F | Resp 18 | Wt 151.2 lb

## 2019-02-16 DIAGNOSIS — A419 Sepsis, unspecified organism: Secondary | ICD-10-CM

## 2019-02-16 DIAGNOSIS — E119 Type 2 diabetes mellitus without complications: Secondary | ICD-10-CM | POA: Diagnosis not present

## 2019-02-16 DIAGNOSIS — K861 Other chronic pancreatitis: Secondary | ICD-10-CM

## 2019-02-16 DIAGNOSIS — K739 Chronic hepatitis, unspecified: Secondary | ICD-10-CM | POA: Diagnosis not present

## 2019-02-16 NOTE — Progress Notes (Signed)
Patient: Anthony Craig Male    DOB: 1928/06/16   85 y.o.   MRN: [id number] Visit Date: 02/16/2019  Today's Provider: Wilhemena Durie, MD   Chief Complaint  Patient presents with  . Hospitalization Follow-up   Subjective:     HPI 1 week hospital follow up for pancreatitis.  Patient had another attack on 02/06/2019 and is followed at the [facility] for this.  His pain is much better and he is having no more nausea vomiting.  Getting his strength back.  Allergies  Allergen Reactions  . Doxycycline Other (See Comments)    Reaction: per [facility] patient called on 11/30/14 stating he was having a reaction to a medication possibly including redness, swelling and itching, he was not sure at that time if it was this medication or Hydrocodone-homatropin.   Marland Kitchen Hydrocodone-Homatropine Other (See Comments)    Reaction:  Reaction: per [facility] patient called on 11/30/14 stating he was having a reaction to a medication possibly including redness, swelling and itching, he was not sure at that time if it was this medication or Doxy that he was taking at the same time. Patient has been taking Hydrocodone with no problems.     Current Outpatient Medications:  .  ACCU-CHEK SMARTVIEW test strip, CHECK SUGAR THREE TIMES DAILY, Disp: 300 each, Rfl: 4 .  Alcohol Swabs (B-D SINGLE USE SWABS REGULAR) PADS, USE AS DIRECTED  WITH  EACH  FINGERSTICK, Disp: 300 each, Rfl: 3 .  aspirin EC 81 MG tablet, Take 81 mg by mouth daily as needed (for chest pain). , Disp: , Rfl:  .  b complex vitamins tablet, Take 1 tablet by mouth daily., Disp: , Rfl:  .  Coenzyme Q10 (COQ10) 200 MG CAPS, Take 1 capsule by mouth daily., Disp: , Rfl:  .  glucose blood test strip, Check sugar 3 times daily. DX E11.9. Needs one touch verio strips, Disp: 300 each, Rfl: 3 .  HYDROcodone-acetaminophen (NORCO/VICODIN) 5-325 MG tablet, Take 1-2 tablets by mouth every 4 (four) hours as needed for moderate pain., Disp: 100 tablet, Rfl: 0 .   hydrocortisone 2.5 % lotion, Apply 1 application topically 2 (two) times daily. To affected areas of face only as needed for flares of itching/scaling, Disp: , Rfl:  .  hydroxypropyl methylcellulose / hypromellose (ISOPTO TEARS / GONIOVISC) 2.5 % ophthalmic solution, Place 1 drop into both eyes 4 (four) times daily as needed for dry eyes., Disp: , Rfl:  .  insulin aspart (NOVOLOG FLEXPEN) 100 UNIT/ML FlexPen, Inject 4 Units into the skin 3 (three) times daily with meals. 90 day supply. Generic ok. (Patient taking differently: Inject 6 Units into the skin 3 (three) times daily with meals. 90 day supply. Generic ok.), Disp: 15 mL, Rfl: 3 .  insulin glargine (LANTUS) 100 UNIT/ML injection, Inject 10 Units into the skin daily., Disp: , Rfl:  .  lipase/protease/amylase (CREON) 36000 UNITS CPEP capsule, Take 36,000 Units by mouth., Disp: , Rfl:  .  Multiple Vitamins-Minerals (MULTIVITAMIN PO), Take 1 tablet by mouth daily., Disp: , Rfl:  .  omega-3 acid ethyl esters (LOVAZA) 1 g capsule, Take by mouth 2 (two) times daily., Disp: , Rfl:  .  omeprazole (PRILOSEC) 20 MG capsule, Take 1 capsule (20 mg total) by mouth daily., Disp: 90 capsule, Rfl: 3 .  primidone (MYSOLINE) 50 MG tablet, Take 1 tablet (50 mg total) by mouth 4 (four) times daily. (Patient taking differently: Take 50 mg by mouth at bedtime. ),  Disp: 360 tablet, Rfl: 3 .  vitamin B-12 (CYANOCOBALAMIN) 1000 MCG tablet, Take 1,000 mcg by mouth daily., Disp: , Rfl:  .  atorvastatin (LIPITOR) 40 MG tablet, TAKE 1 TABLET BY MOUTH EVERYDAY AT BEDTIME (Patient not taking: Reported on 02/16/2019), Disp: 90 tablet, Rfl: 3 .  calcium-vitamin D 250-100 MG-UNIT per tablet, Take 1 tablet by mouth 2 (two) times daily., Disp: , Rfl:  .  cholecalciferol (VITAMIN D) 1000 UNITS tablet, Take 1,000 Units by mouth daily., Disp: , Rfl:  .  diphenhydrAMINE (BENADRYL) 25 MG tablet, Take 25 mg by mouth every 6 (six) hours as needed., Disp: , Rfl:  .  diphenoxylate-atropine  (LOMOTIL) 2.5-0.025 MG per tablet, Take 1 tablet by mouth 4 (four) times daily as needed for diarrhea or loose stools., Disp: , Rfl:  .  fluocinonide (LIDEX) 0.05 % external solution, Apply 1 application topically daily. To affected areas of scalp, Disp: , Rfl:  .  SODIUM CHLORIDE, GU IRRIGANT, IR, Irrigate with as directed. Irrigant every day flush drain once daily with 10 ML saline solution, Disp: , Rfl:   Review of Systems  Constitutional: Negative.   HENT: Negative.   Eyes: Negative.   Respiratory: Negative.   Cardiovascular: Negative.   Gastrointestinal: Positive for abdominal pain.  Endocrine: Negative.   Genitourinary: Negative.   Allergic/Immunologic: Negative.   Neurological: Negative.   Hematological: Negative.   Psychiatric/Behavioral: Negative.   All other systems reviewed and are negative.   Social History   Tobacco Use  . Smoking status: Former Smoker    Packs/day: 0.50    Years: 30.00    Pack years: 15.00    Last attempt to quit: 10/06/1979    Years since quitting: 39.3  . Smokeless tobacco: Never Used  Substance Use Topics  . Alcohol use: No    Comment: Maybe a beer or 2 a month      Objective:   BP 126/68 (BP Location: Left Arm, Patient Position: Sitting, Cuff Size: Normal)   Pulse 69   Temp (!) 97.5 F (36.4 C) (Oral)   Resp 18   Wt 151 lb 3.2 oz (68.6 kg)   SpO2 99%   BMI 21.09 kg/m  Vitals:   02/16/19 1115  BP: 126/68  Pulse: 69  Resp: 18  Temp: (!) 97.5 F (36.4 C)  TempSrc: Oral  SpO2: 99%  Weight: 151 lb 3.2 oz (68.6 kg)     Physical Exam Vitals signs reviewed.  Constitutional:      Comments: Cachectic white male in no acute distress.  HENT:     Head: Normocephalic and atraumatic.     Right Ear: External ear normal.     Left Ear: External ear normal.  Eyes:     General: No scleral icterus.    Conjunctiva/sclera: Conjunctivae normal.  Cardiovascular:     Rate and Rhythm: Normal rate and regular rhythm.     Heart sounds:  Normal heart sounds.  Pulmonary:     Effort: Pulmonary effort is normal.     Breath sounds: Normal breath sounds.  Abdominal:     Palpations: Abdomen is soft.     Tenderness: There is no abdominal tenderness.  Musculoskeletal:     Right lower leg: No edema.     Left lower leg: No edema.  Skin:    General: Skin is warm and dry.  Neurological:     Mental Status: He is alert and oriented to person, place, and time. Mental status is at baseline.  Psychiatric:  Mood and Affect: Mood normal.        Behavior: Behavior normal.        Thought Content: Thought content normal.        Judgment: Judgment normal.         Assessment & Plan    1. Chronic pancreatitis, unspecified pancreatitis type (HCC)/history of cancer/status post Whipple procedure Followed at [facility]. - Lipase  2. Diabetes mellitus without complication (HCC)  - HgB A1c  3. Chronic hepatitis ([place]) Secondary to pancreatitis - Comp. Metabolic Panel (12)  4. Sepsis, due to unspecified organism, unspecified whether acute organ dysfunction present ([place]) Secondary to above. - CBC w/Diff/Platelet     Richard Cranford Mon, MD  [facility]

## 2019-02-17 DIAGNOSIS — A419 Sepsis, unspecified organism: Secondary | ICD-10-CM | POA: Diagnosis not present

## 2019-02-17 DIAGNOSIS — K861 Other chronic pancreatitis: Secondary | ICD-10-CM | POA: Diagnosis not present

## 2019-02-17 DIAGNOSIS — E119 Type 2 diabetes mellitus without complications: Secondary | ICD-10-CM | POA: Diagnosis not present

## 2019-02-17 DIAGNOSIS — K739 Chronic hepatitis, unspecified: Secondary | ICD-10-CM | POA: Diagnosis not present

## 2019-02-18 ENCOUNTER — Telehealth: Payer: Self-pay

## 2019-02-18 LAB — COMP. METABOLIC PANEL (12)
AST: 43 IU/L — ABNORMAL HIGH (ref 0–40)
Albumin/Globulin Ratio: 1.7 (ref 1.2–2.2)
Albumin: 4.2 g/dL (ref 3.6–4.6)
Alkaline Phosphatase: 174 IU/L — ABNORMAL HIGH (ref 39–117)
BUN/Creatinine Ratio: 16 (ref 10–24)
BUN: 16 mg/dL (ref 8–27)
Bilirubin Total: 0.5 mg/dL (ref 0.0–1.2)
Calcium: 9.9 mg/dL (ref 8.6–10.2)
Chloride: 99 mmol/L (ref 96–106)
Creatinine, Ser: 0.97 mg/dL (ref 0.76–1.27)
GFR calc Af Amer: 80 mL/min/{1.73_m2} (ref >59)
GFR calc non Af Amer: 69 mL/min/{1.73_m2} (ref >59)
Globulin, Total: 2.5 g/dL (ref 1.5–4.5)
Glucose: 114 mg/dL — ABNORMAL HIGH (ref 65–99)
Potassium: 5.1 mmol/L (ref 3.5–5.2)
Sodium: 133 mmol/L — ABNORMAL LOW (ref 134–144)
Total Protein: 6.7 g/dL (ref 6.0–8.5)

## 2019-02-18 LAB — CBC WITH DIFFERENTIAL/PLATELET
Basophils Absolute: 0 10*3/uL (ref 0.0–0.2)
Basos: 0 %
EOS (ABSOLUTE): 0.2 10*3/uL (ref 0.0–0.4)
Eos: 4 %
Hematocrit: 38.3 % (ref 37.5–51.0)
Hemoglobin: 13.2 g/dL (ref 13.0–17.7)
Immature Grans (Abs): 0 10*3/uL (ref 0.0–0.1)
Immature Granulocytes: 0 %
Lymphocytes Absolute: 1.1 10*3/uL (ref 0.7–3.1)
Lymphs: 23 %
MCH: 27 pg (ref 26.6–33.0)
MCHC: 34.5 g/dL (ref 31.5–35.7)
MCV: 79 fL (ref 79–97)
Monocytes Absolute: 0.5 10*3/uL (ref 0.1–0.9)
Monocytes: 9 %
Neutrophils Absolute: 3.1 10*3/uL (ref 1.4–7.0)
Neutrophils: 64 %
Platelets: 230 10*3/uL (ref 150–450)
RBC: 4.88 x10E6/uL (ref 4.14–5.80)
RDW: 14.8 % (ref 11.6–15.4)
WBC: 4.9 10*3/uL (ref 3.4–10.8)

## 2019-02-18 LAB — LIPASE: Lipase: 6 U/L — ABNORMAL LOW (ref 13–78)

## 2019-02-18 LAB — HEMOGLOBIN A1C
Est. average glucose Bld gHb Est-mCnc: 174 mg/dL
Hgb A1c MFr Bld: 7.7 % — ABNORMAL HIGH (ref 4.8–5.6)

## 2019-02-18 NOTE — Telephone Encounter (Signed)
Lab results given to patient.

## 2019-02-18 NOTE — Telephone Encounter (Signed)
-----   Message from Jerrol Banana., MD sent at 02/18/2019 10:01 AM EDT ----- Labs okay.  Diabetes fairly stable.

## 2019-04-04 ENCOUNTER — Other Ambulatory Visit: Payer: Self-pay

## 2019-04-04 DIAGNOSIS — K219 Gastro-esophageal reflux disease without esophagitis: Secondary | ICD-10-CM

## 2019-04-04 MED ORDER — OMEPRAZOLE 20 MG PO CPDR: 20.0000 mg | DELAYED_RELEASE_CAPSULE | Freq: Every day | ORAL | 3 refills | Status: DC

## 2019-06-03 ENCOUNTER — Telehealth: Payer: Self-pay

## 2019-06-03 NOTE — Telephone Encounter (Signed)
Patient called stating that he needs you to contact him regarding his appointment 06/07/2019

## 2019-06-06 NOTE — Progress Notes (Signed)
Subjective:   Anthony Craig is a 85 y.o. male who presents for Medicare Annual/Subsequent preventive examination.    This visit is being conducted through telemedicine due to the COVID-19 pandemic. This patient has given me verbal consent via doximity to conduct this visit, patient states they are participating from their home address. Some vital signs may be absent or patient reported.    Patient identification: identified by name, DOB, and current address  Review of Systems:  N/A  Cardiac Risk Factors include: advanced age (>32men, >51 women);male gender;dyslipidemia     Objective:    Vitals: There were no vitals taken for this visit.  There is no height or weight on file to calculate BMI. Unable to obtain vitals due to visit being conducted via telephonically.   Advanced Directives 06/07/2019 01/02/2019 06/03/2018 05/28/2017 09/18/2015 04/30/2015 04/27/2015  Does Patient Have a Medical Advance Directive? Yes Yes Yes Yes Yes - Yes  Type of Advance Directive [place];Living will Out of facility DNR (pink MOST or yellow form) [place];Living will Living will;Healthcare Power of [place];Living will Living will Living will  Does patient want to make changes to medical advance directive? - - - - - - No - Patient declined  Copy of [place] in Chart? No - copy requested - No - copy requested No - copy requested - - No - copy requested  Would patient like information on creating a medical advance directive? - No - Patient declined - - - - No - patient declined information    Tobacco Social History   Tobacco Use  Smoking Status Former Smoker  . Packs/day: 0.50  . Years: 30.00  . Pack years: 15.00  . Quit date: 10/06/1979  . Years since quitting: 39.6  Smokeless Tobacco Never Used     Counseling given: Not Answered   Clinical Intake:  Pre-visit preparation completed: Yes  Pain :  No/denies pain Pain Score: 0-No pain     Nutritional Risks: None Diabetes: Yes  How often do you need to have someone help you when you read instructions, pamphlets, or other written materials from your doctor or pharmacy?: 1 - Never   Diabetes:  Is the patient diabetic?  Yes  If diabetic, was a CBG obtained today?  No  Did the patient bring in their glucometer from home?  No  How often do you monitor your CBG's? Three or more times a day..   Financial Strains and Diabetes Management:  Are you having any financial strains with the device, your supplies or your medication? No .  Does the patient want to be seen by Chronic Care Management for management of their diabetes?  No  Would the patient like to be referred to a Nutritionist or for Diabetic Management?  No   Diabetic Exams:  Diabetic Eye Exam: Completed in the fall of 2019. Requested records to be faxed to clinic.   Diabetic Foot Exam: Completed 02/03/17. Pt has been advised about the importance in completing this exam. Note made to follow up on this at next in office visit.    Interpreter Needed?: No  Information entered by :: [facility], LPN  Past Medical History:  Diagnosis Date  . Cancer [facility])    pancreatic  . Coronary artery disease   . Diabetes mellitus without complication ([place])    type 2  . Hyperlipidemia   . Hypertension    Past Surgical History:  Procedure Laterality Date  .  APPENDECTOMY     1977  . BILIARY DRAINAGE    . CORONARY ARTERY BYPASS GRAFT     2001-3 vessel  . HERNIA REPAIR Bilateral    2016-left, 2009-right  . WHIPPLE PROCEDURE  2007   Family History  Problem Relation Age of Onset  . Heart disease Mother   . Cancer Mother 71       breast  . Heart disease Father   . COPD Brother   . Stroke Brother   . Diabetes Other        1 and 2   Social History   Socioeconomic History  . Marital status: Married    Spouse name: Not on file  . Number of children: 0  . Years of education:  Not on file  . Highest education level: Some college, no degree  Occupational History  . Occupation: retired  . Occupation: part time job @ [employer]  Social Needs  . Financial resource strain: Not hard at all  . Food insecurity    Worry: Never true    Inability: Never true  . Transportation needs    Medical: No    Non-medical: No  Tobacco Use  . Smoking status: Former Smoker    Packs/day: 0.50    Years: 30.00    Pack years: 15.00    Quit date: 10/06/1979    Years since quitting: 39.6  . Smokeless tobacco: Never Used  Substance and Sexual Activity  . Alcohol use: No    Comment: Maybe a beer or 2 a month  . Drug use: No  . Sexual activity: Not on file  Lifestyle  . Physical activity    Days per week: 0 days    Minutes per session: 0 min  . Stress: Not at all  Relationships  . Social Herbalist on phone: Patient refused    Gets together: Patient refused    Attends religious service: Patient refused    Active member of club or organization: Patient refused    Attends meetings of clubs or organizations: Patient refused    Relationship status: Patient refused  Other Topics Concern  . Not on file  Social History Narrative  . Not on file    Outpatient Encounter Medications as of 06/07/2019  Medication Sig  . ACCU-CHEK SMARTVIEW test strip CHECK SUGAR THREE TIMES DAILY  . Alcohol Swabs (B-D SINGLE USE SWABS REGULAR) PADS USE AS DIRECTED  WITH  EACH  FINGERSTICK  . aspirin EC 81 MG tablet Take 81 mg by mouth daily.   Marland Kitchen atorvastatin (LIPITOR) 40 MG tablet TAKE 1 TABLET BY MOUTH EVERYDAY AT BEDTIME  . b complex vitamins tablet Take 1 tablet by mouth daily.  . calcium-vitamin D 250-100 MG-UNIT per tablet Take 1 tablet by mouth daily.   . Coenzyme Q10 (COQ10) 200 MG CAPS Take 1 capsule by mouth daily.  . diphenhydrAMINE (BENADRYL) 25 MG tablet Take 25 mg by mouth every 6 (six) hours as needed.  . diphenoxylate-atropine (LOMOTIL) 2.5-0.025 MG per tablet Take 1  tablet by mouth 4 (four) times daily as needed for diarrhea or loose stools.  Marland Kitchen glucose blood test strip Check sugar 3 times daily. DX E11.9. Needs one touch verio strips  . HYDROcodone-acetaminophen (NORCO/VICODIN) 5-325 MG tablet Take 1-2 tablets by mouth every 4 (four) hours as needed for moderate pain.  . hydrocortisone 2.5 % lotion Apply 1 application topically 2 (two) times daily. To affected areas of face only as needed for flares  of itching/scaling  . hydroxypropyl methylcellulose / hypromellose (ISOPTO TEARS / GONIOVISC) 2.5 % ophthalmic solution Place 1 drop into both eyes 4 (four) times daily as needed for dry eyes.  . insulin aspart (NOVOLOG FLEXPEN) 100 UNIT/ML FlexPen Inject 4 Units into the skin 3 (three) times daily with meals. 90 day supply. Generic ok. (Patient taking differently: Inject 7 Units into the skin 3 (three) times daily with meals. 90 day supply. Generic ok.)  . insulin glargine (LANTUS) 100 UNIT/ML injection Inject 10 Units into the skin daily.  . lipase/protease/amylase (CREON) 36000 UNITS CPEP capsule Take 36,000 Units by mouth.  . Multiple Vitamins-Minerals (MULTIVITAMIN PO) Take 1 tablet by mouth daily.  Marland Kitchen omega-3 acid ethyl esters (LOVAZA) 1 g capsule Take by mouth 2 (two) times daily.  Marland Kitchen omeprazole (PRILOSEC) 20 MG capsule Take 1 capsule (20 mg total) by mouth daily.  . primidone (MYSOLINE) 50 MG tablet Take 1 tablet (50 mg total) by mouth 4 (four) times daily. (Patient taking differently: Take 50 mg by mouth at bedtime. )  . vitamin B-12 (CYANOCOBALAMIN) 1000 MCG tablet Take 1,000 mcg by mouth daily.  . cholecalciferol (VITAMIN D) 1000 UNITS tablet Take 1,000 Units by mouth daily.  . fluocinonide (LIDEX) 0.05 % external solution Apply 1 application topically daily. To affected areas of scalp  . SODIUM CHLORIDE, GU IRRIGANT, IR Irrigate with as directed. Irrigant every day flush drain once daily with 10 ML saline solution   No facility-administered encounter  medications on file as of 06/07/2019.     Activities of Daily Living In your present state of health, do you have any difficulty performing the following activities: 06/07/2019  Hearing? N  Vision? N  Comment Wears eye glasses.  Difficulty concentrating or making decisions? N  Walking or climbing stairs? N  Dressing or bathing? N  Doing errands, shopping? N  Preparing Food and eating ? N  Using the Toilet? N  In the past six months, have you accidently leaked urine? Y  Comment Just started this recently. Advised pt to f/u with PCP regarding this.  Do you have problems with loss of bowel control? N  Managing your Medications? N  Managing your Finances? N  Housekeeping or managing your Housekeeping? N  Some recent data might be hidden    Patient Care Team: Jerrol Banana., MD as PCP - General (Family Medicine) Benito Mccreedy, MD as Referring Physician (Family Medicine)   Assessment:   This is a routine wellness examination for Anthony Craig.  Exercise Activities and Dietary recommendations Current Exercise Habits: Structured exercise class, Type of exercise: stretching;strength training/weights;walking, Time (Minutes): 30, Frequency (Times/Week): 3, Weekly Exercise (Minutes/Week): 90, Intensity: Mild, Exercise limited by: None identified  Goals    . water intake     Recommend increasing water intake to 4-6 glasses of water a day.       Fall Risk Fall Risk  06/07/2019 06/03/2018 05/28/2017 05/29/2016 11/28/2015  Falls in the past year? 0 No No Yes No  Number falls in past yr: - - - 1 -  Injury with Fall? - - - No -   FALL RISK PREVENTION PERTAINING TO THE HOME:  Any stairs in or around the home? No  If so, are there any without handrails? N/A  Home free of loose throw rugs in walkways, pet beds, electrical cords, etc? Yes  Adequate lighting in your home to reduce risk of falls? Yes   ASSISTIVE DEVICES UTILIZED TO PREVENT FALLS:  Life alert? No  Use of a cane, walker or  w/c? No  Grab bars in the bathroom? Yes  Shower chair or bench in shower? Yes  Elevated toilet seat or a handicapped toilet? No    TIMED UP AND GO:  Was the test performed? No .    Depression Screen PHQ 2/9 Scores 06/07/2019 02/16/2019 06/03/2018 06/03/2018  PHQ - 2 Score 0 0 1 1  PHQ- 9 Score - - - 2    Cognitive Function     6CIT Screen 06/07/2019 05/28/2017  What Year? 0 points 0 points  What month? 0 points 0 points  What time? 0 points 0 points  Count back from 20 0 points 0 points  Months in reverse 0 points 0 points  Repeat phrase 2 points 0 points  Total Score 2 0    Immunization History  Administered Date(s) Administered  . Influenza,inj,Quad PF,6-35 Mos 07/01/2017  . Influenza-Unspecified 07/09/2015  . Pneumococcal Conjugate-13 11/14/2013  . Pneumococcal Polysaccharide-23 06/16/2007, 06/08/2010  . Td 06/08/2006  . Tdap 07/01/2017    Qualifies for Shingles Vaccine? Yes . Due for Shingrix. Education has been provided regarding the importance of this vaccine. Pt has been advised to call insurance company to determine out of pocket expense. Advised may also receive vaccine at local pharmacy or Health Dept. Verbalized acceptance and understanding.  Tdap: Up to date  Flu Vaccine: Due for Flu vaccine. Does the patient want to receive this vaccine today?  No .   Pneumococcal Vaccine: Completed series  Screening Tests Health Maintenance  Topic Date Due  . OPHTHALMOLOGY EXAM  06/06/2017  . FOOT EXAM  02/03/2018  . INFLUENZA VACCINE  05/07/2019  . HEMOGLOBIN A1C  08/20/2019  . URINE MICROALBUMIN  09/07/2019  . TETANUS/TDAP  07/02/2027  . PNA vac Low Risk Adult  Completed   Cancer Screenings:  Colorectal Screening: No longer required.   Lung Cancer Screening: (Low Dose CT Chest recommended if Age 44-80 years, 30 pack-year currently smoking OR have quit w/in 15years.) does not qualify.   Additional Screening:  Dental Screening: Recommended annual dental exams  for proper oral hygiene  Community Resource Referral:  CRR required this visit?  No        Plan:  I have personally reviewed and addressed the Medicare Annual Wellness questionnaire and have noted the following in the patient's chart:  A. Medical and social history B. Use of alcohol, tobacco or illicit drugs  C. Current medications and supplements D. Functional ability and status E.  Nutritional status F.  Physical activity G. Advance directives H. List of other physicians I.  Hospitalizations, surgeries, and ER visits in previous 12 months J.  [place] such as hearing and vision if needed, cognitive and depression L. Referrals and appointments   In addition, I have reviewed and discussed with patient certain preventive protocols, quality metrics, and best practice recommendations. A written personalized care plan for preventive services as well as general preventive health recommendations were provided to patient.   Glendora Score, [place]  [id number] Nurse Health Advisor  Nurse Notes: Pt needs a diabetic foot exam and influenza vaccine at next in office visit.

## 2019-06-06 NOTE — Telephone Encounter (Signed)
Spoke with wife who states pt is not home currently but she will have him CB when he returns home. Direct # given.

## 2019-06-07 ENCOUNTER — Other Ambulatory Visit: Payer: Self-pay

## 2019-06-07 ENCOUNTER — Ambulatory Visit (INDEPENDENT_AMBULATORY_CARE_PROVIDER_SITE_OTHER): Payer: Medicare HMO

## 2019-06-07 DIAGNOSIS — Z0000 Encounter for general adult medical examination without abnormal findings: Secondary | ICD-10-CM | POA: Diagnosis not present

## 2019-06-07 NOTE — Patient Instructions (Signed)
Anthony Craig , Thank you for taking time to come for your Medicare Wellness Visit. I appreciate your ongoing commitment to your health goals. Please review the following plan we discussed and let me know if I can assist you in the future.   Screening recommendations/referrals: Colonoscopy: No longer required.  Recommended yearly ophthalmology/optometry visit for glaucoma screening and checkup Recommended yearly dental visit for hygiene and checkup  Vaccinations: Influenza vaccine: Currently due Pneumococcal vaccine: Completed series Tdap vaccine: Up to date, due 06/2027 Shingles vaccine: Pt declines today.     Advanced directives: Please bring a copy of your POA (Power of Attorney) and/or Living Will to your next appointment.   Conditions/risks identified: Recommend to increase water intake to 6-8 8 oz glasses a day.   Next appointment: 08/22/19 with Dr Rosanna Randy.   Preventive Care 30 Years and Older, Male Preventive care refers to lifestyle choices and visits with your health care provider that can promote health and wellness. What does preventive care include?  A yearly physical exam. This is also called an annual well check.  Dental exams once or twice a year.  Routine eye exams. Ask your health care provider how often you should have your eyes checked.  Personal lifestyle choices, including:  Daily care of your teeth and gums.  Regular physical activity.  Eating a healthy diet.  Avoiding tobacco and drug use.  Limiting alcohol use.  Practicing safe sex.  Taking low doses of aspirin every day.  Taking vitamin and mineral supplements as recommended by your health care provider. What happens during an annual well check? The services and screenings done by your health care provider during your annual well check will depend on your age, overall health, lifestyle risk factors, and family history of disease. Counseling  Your health care provider may ask you questions about  your:  Alcohol use.  Tobacco use.  Drug use.  Emotional well-being.  Home and relationship well-being.  Sexual activity.  Eating habits.  History of falls.  Memory and ability to understand (cognition).  Work and work Statistician. Screening  You may have the following tests or measurements:  Height, weight, and BMI.  Blood pressure.  Lipid and cholesterol levels. These may be checked every 5 years, or more frequently if you are over 61 years old.  Skin check.  Lung cancer screening. You may have this screening every year starting at age 73 if you have a 30-pack-year history of smoking and currently smoke or have quit within the past 15 years.  Fecal occult blood test (FOBT) of the stool. You may have this test every year starting at age 46.  Flexible sigmoidoscopy or colonoscopy. You may have a sigmoidoscopy every 5 years or a colonoscopy every 10 years starting at age 4.  Prostate cancer screening. Recommendations will vary depending on your family history and other risks.  Hepatitis C blood test.  Hepatitis B blood test.  Sexually transmitted disease (STD) testing.  Diabetes screening. This is done by checking your blood sugar (glucose) after you have not eaten for a while (fasting). You may have this done every 1-3 years.  Abdominal aortic aneurysm (AAA) screening. You may need this if you are a current or former smoker.  Osteoporosis. You may be screened starting at age 96 if you are at high risk. Talk with your health care provider about your test results, treatment options, and if necessary, the need for more tests. Vaccines  Your health care provider may recommend certain vaccines, such  as:  Influenza vaccine. This is recommended every year.  Tetanus, diphtheria, and acellular pertussis (Tdap, Td) vaccine. You may need a Td booster every 10 years.  Zoster vaccine. You may need this after age 58.  Pneumococcal 13-valent conjugate (PCV13) vaccine.  One dose is recommended after age 48.  Pneumococcal polysaccharide (PPSV23) vaccine. One dose is recommended after age 52. Talk to your health care provider about which screenings and vaccines you need and how often you need them. This information is not intended to replace advice given to you by your health care provider. Make sure you discuss any questions you have with your health care provider. Document Released: 10/19/2015 Document Revised: 06/11/2016 Document Reviewed: 07/24/2015 Elsevier Interactive Patient Education  2017 [place] Prevention in the Home Falls can cause injuries. They can happen to people of all ages. There are many things you can do to make your home safe and to help prevent falls. What can I do on the outside of my home?  Regularly fix the edges of walkways and driveways and fix any cracks.  Remove anything that might make you trip as you walk through a door, such as a raised step or threshold.  Trim any bushes or trees on the path to your home.  Use bright outdoor lighting.  Clear any walking paths of anything that might make someone trip, such as rocks or tools.  Regularly check to see if handrails are loose or broken. Make sure that both sides of any steps have handrails.  Any raised decks and porches should have guardrails on the edges.  Have any leaves, snow, or ice cleared regularly.  Use sand or salt on walking paths during winter.  Clean up any spills in your garage right away. This includes oil or grease spills. What can I do in the bathroom?  Use night lights.  Install grab bars by the toilet and in the tub and shower. Do not use towel bars as grab bars.  Use non-skid mats or decals in the tub or shower.  If you need to sit down in the shower, use a plastic, non-slip stool.  Keep the floor dry. Clean up any water that spills on the floor as soon as it happens.  Remove soap buildup in the tub or shower regularly.  Attach bath  mats securely with double-sided non-slip rug tape.  Do not have throw rugs and other things on the floor that can make you trip. What can I do in the bedroom?  Use night lights.  Make sure that you have a light by your bed that is easy to reach.  Do not use any sheets or blankets that are too big for your bed. They should not hang down onto the floor.  Have a firm chair that has side arms. You can use this for support while you get dressed.  Do not have throw rugs and other things on the floor that can make you trip. What can I do in the kitchen?  Clean up any spills right away.  Avoid walking on wet floors.  Keep items that you use a lot in easy-to-reach places.  If you need to reach something above you, use a strong step stool that has a grab bar.  Keep electrical cords out of the way.  Do not use floor polish or wax that makes floors slippery. If you must use wax, use non-skid floor wax.  Do not have throw rugs and other things on the  floor that can make you trip. What can I do with my stairs?  Do not leave any items on the stairs.  Make sure that there are handrails on both sides of the stairs and use them. Fix handrails that are broken or loose. Make sure that handrails are as long as the stairways.  Check any carpeting to make sure that it is firmly attached to the stairs. Fix any carpet that is loose or worn.  Avoid having throw rugs at the top or bottom of the stairs. If you do have throw rugs, attach them to the floor with carpet tape.  Make sure that you have a light switch at the top of the stairs and the bottom of the stairs. If you do not have them, ask someone to add them for you. What else can I do to help prevent falls?  Wear shoes that:  Do not have high heels.  Have rubber bottoms.  Are comfortable and fit you well.  Are closed at the toe. Do not wear sandals.  If you use a stepladder:  Make sure that it is fully opened. Do not climb a closed  stepladder.  Make sure that both sides of the stepladder are locked into place.  Ask someone to hold it for you, if possible.  Clearly mark and make sure that you can see:  Any grab bars or handrails.  First and last steps.  Where the edge of each step is.  Use tools that help you move around (mobility aids) if they are needed. These include:  Canes.  Walkers.  Scooters.  Crutches.  Turn on the lights when you go into a dark area. Replace any light bulbs as soon as they burn out.  Set up your furniture so you have a clear path. Avoid moving your furniture around.  If any of your floors are uneven, fix them.  If there are any pets around you, be aware of where they are.  Review your medicines with your doctor. Some medicines can make you feel dizzy. This can increase your chance of falling. Ask your doctor what other things that you can do to help prevent falls. This information is not intended to replace advice given to you by your health care provider. Make sure you discuss any questions you have with your health care provider. Document Released: 07/19/2009 Document Revised: 02/28/2016 Document Reviewed: 10/27/2014 Elsevier Interactive Patient Education  2017 [employer].

## 2019-06-08 ENCOUNTER — Other Ambulatory Visit: Payer: Self-pay

## 2019-06-08 ENCOUNTER — Encounter: Payer: Self-pay | Admitting: Family Medicine

## 2019-06-08 ENCOUNTER — Ambulatory Visit (INDEPENDENT_AMBULATORY_CARE_PROVIDER_SITE_OTHER): Payer: Medicare HMO | Admitting: Family Medicine

## 2019-06-08 VITALS — BP 128/66 | HR 55 | Temp 97.3°F | Resp 18 | Wt 158.2 lb

## 2019-06-08 DIAGNOSIS — K861 Other chronic pancreatitis: Secondary | ICD-10-CM | POA: Diagnosis not present

## 2019-06-08 DIAGNOSIS — I25708 Atherosclerosis of coronary artery bypass graft(s), unspecified, with other forms of angina pectoris: Secondary | ICD-10-CM | POA: Diagnosis not present

## 2019-06-08 DIAGNOSIS — E538 Deficiency of other specified B group vitamins: Secondary | ICD-10-CM | POA: Diagnosis not present

## 2019-06-08 DIAGNOSIS — R350 Frequency of micturition: Secondary | ICD-10-CM | POA: Diagnosis not present

## 2019-06-08 DIAGNOSIS — C61 Malignant neoplasm of prostate: Secondary | ICD-10-CM | POA: Diagnosis not present

## 2019-06-08 DIAGNOSIS — C250 Malignant neoplasm of head of pancreas: Secondary | ICD-10-CM

## 2019-06-08 DIAGNOSIS — Z794 Long term (current) use of insulin: Secondary | ICD-10-CM

## 2019-06-08 DIAGNOSIS — E1159 Type 2 diabetes mellitus with other circulatory complications: Secondary | ICD-10-CM | POA: Diagnosis not present

## 2019-06-08 LAB — POCT URINALYSIS DIPSTICK
Bilirubin, UA: NEGATIVE
Blood, UA: NEGATIVE
Glucose, UA: NEGATIVE
Ketones, UA: NEGATIVE
Leukocytes, UA: NEGATIVE
Nitrite, UA: NEGATIVE
Protein, UA: NEGATIVE
Spec Grav, UA: 1.02 (ref 1.010–1.025)
Urobilinogen, UA: 0.2 E.U./dL
pH, UA: 5 (ref 5.0–8.0)

## 2019-06-08 MED ORDER — OXYBUTYNIN CHLORIDE ER 5 MG PO TB24: 5.0000 mg | ORAL_TABLET | Freq: Every day | ORAL | 11 refills | Status: DC

## 2019-06-08 NOTE — Progress Notes (Signed)
dito      Patient: Anthony Craig Male    DOB: [birth year]   85 y.o.   MRN: [id number] Visit Date: 06/08/2019  Today's Provider: Wilhemena Durie, MD   Chief Complaint  Patient presents with  . Urinary Urgency   Subjective:     HPI Patient has been experiencing urinary frequency for 3 months. He also has lower back pain. He is s/p prostatectomy years ago for prostate cancer.He describes urinary urgency and also some leakage at times. He is seen frequently at [facility] for recurrent pancreatitis and DM. Allergies  Allergen Reactions  . Doxycycline Other (See Comments)    Reaction: per [facility] patient called on 11/30/14 stating he was having a reaction to a medication possibly including redness, swelling and itching, he was not sure at that time if it was this medication or Hydrocodone-homatropin.   Marland Kitchen Hydrocodone-Homatropine Other (See Comments)    Reaction:  Reaction: per [facility] patient called on 11/30/14 stating he was having a reaction to a medication possibly including redness, swelling and itching, he was not sure at that time if it was this medication or Doxy that he was taking at the same time. Patient has been taking Hydrocodone with no problems.     Current Outpatient Medications:  .  ACCU-CHEK SMARTVIEW test strip, CHECK SUGAR THREE TIMES DAILY, Disp: 300 each, Rfl: 4 .  Alcohol Swabs (B-D SINGLE USE SWABS REGULAR) PADS, USE AS DIRECTED  WITH  EACH  FINGERSTICK, Disp: 300 each, Rfl: 3 .  aspirin EC 81 MG tablet, Take 81 mg by mouth daily. , Disp: , Rfl:  .  b complex vitamins tablet, Take 1 tablet by mouth daily., Disp: , Rfl:  .  calcium-vitamin D 250-100 MG-UNIT per tablet, Take 1 tablet by mouth daily. , Disp: , Rfl:  .  cholecalciferol (VITAMIN D) 1000 UNITS tablet, Take 1,000 Units by mouth daily., Disp: , Rfl:  .  diphenhydrAMINE (BENADRYL) 25 MG tablet, Take 25 mg by mouth every 6 (six) hours as needed., Disp: , Rfl:  .  diphenoxylate-atropine (LOMOTIL) 2.5-0.025 MG  per tablet, Take 1 tablet by mouth 4 (four) times daily as needed for diarrhea or loose stools., Disp: , Rfl:  .  glucose blood test strip, Check sugar 3 times daily. DX E11.9. Needs one touch verio strips, Disp: 300 each, Rfl: 3 .  HYDROcodone-acetaminophen (NORCO/VICODIN) 5-325 MG tablet, Take 1-2 tablets by mouth every 4 (four) hours as needed for moderate pain., Disp: 100 tablet, Rfl: 0 .  hydrocortisone 2.5 % lotion, Apply 1 application topically 2 (two) times daily. To affected areas of face only as needed for flares of itching/scaling, Disp: , Rfl:  .  hydroxypropyl methylcellulose / hypromellose (ISOPTO TEARS / GONIOVISC) 2.5 % ophthalmic solution, Place 1 drop into both eyes 4 (four) times daily as needed for dry eyes., Disp: , Rfl:  .  insulin aspart (NOVOLOG FLEXPEN) 100 UNIT/ML FlexPen, Inject 4 Units into the skin 3 (three) times daily with meals. 90 day supply. Generic ok. (Patient taking differently: Inject 7 Units into the skin 3 (three) times daily with meals. 90 day supply. Generic ok.), Disp: 15 mL, Rfl: 3 .  insulin glargine (LANTUS) 100 UNIT/ML injection, Inject 10 Units into the skin daily., Disp: , Rfl:  .  lipase/protease/amylase (CREON) 36000 UNITS CPEP capsule, Take 36,000 Units by mouth., Disp: , Rfl:  .  Multiple Vitamins-Minerals (MULTIVITAMIN PO), Take 1 tablet by mouth daily., Disp: , Rfl:  .  omega-3 acid  ethyl esters (LOVAZA) 1 g capsule, Take by mouth 2 (two) times daily., Disp: , Rfl:  .  omeprazole (PRILOSEC) 20 MG capsule, Take 1 capsule (20 mg total) by mouth daily., Disp: 90 capsule, Rfl: 3 .  SODIUM CHLORIDE, GU IRRIGANT, IR, Irrigate with as directed. Irrigant every day flush drain once daily with 10 ML saline solution, Disp: , Rfl:  .  ursodiol (ACTIGALL) 300 MG capsule, Take 300 mg by mouth 2 (two) times daily., Disp: , Rfl:  .  vitamin B-12 (CYANOCOBALAMIN) 1000 MCG tablet, Take 1,000 mcg by mouth daily., Disp: , Rfl:  .  atorvastatin (LIPITOR) 40 MG tablet,  TAKE 1 TABLET BY MOUTH EVERYDAY AT BEDTIME, Disp: 90 tablet, Rfl: 3 .  Coenzyme Q10 (COQ10) 200 MG CAPS, Take 1 capsule by mouth daily., Disp: , Rfl:  .  fluocinonide (LIDEX) 0.05 % external solution, Apply 1 application topically daily. To affected areas of scalp, Disp: , Rfl:  .  primidone (MYSOLINE) 50 MG tablet, Take 1 tablet (50 mg total) by mouth 4 (four) times daily. (Patient taking differently: Take 50 mg by mouth at bedtime. ), Disp: 360 tablet, Rfl: 3  Review of Systems  Constitutional: Negative.   Eyes: Negative.   Respiratory: Negative.   Cardiovascular: Negative.   Gastrointestinal: Negative.   Endocrine: Negative.   Genitourinary: Positive for frequency and urgency.  Musculoskeletal: Positive for back pain.  Allergic/Immunologic: Negative.   Hematological: Negative.   Psychiatric/Behavioral: Negative.   All other systems reviewed and are negative.   Social History   Tobacco Use  . Smoking status: Former Smoker    Packs/day: 0.50    Years: 30.00    Pack years: 15.00    Quit date: 10/06/1979    Years since quitting: 39.6  . Smokeless tobacco: Never Used  Substance Use Topics  . Alcohol use: No    Comment: Maybe a beer or 2 a month      Objective:   BP 128/66 (BP Location: Right Arm, Patient Position: Sitting, Cuff Size: Normal)   Pulse (!) 55   Temp (!) 97.3 F (36.3 C) (Temporal)   Resp 18   Wt 158 lb 3.2 oz (71.8 kg)   SpO2 97%   BMI 22.06 kg/m  Vitals:   06/08/19 1552  BP: 128/66  Pulse: (!) 55  Resp: 18  Temp: (!) 97.3 F (36.3 C)  TempSrc: Temporal  SpO2: 97%  Weight: 158 lb 3.2 oz (71.8 kg)  Body mass index is 22.06 kg/m.   Physical Exam   Results for orders placed or performed in visit on 06/08/19  POCT urinalysis dipstick  Result Value Ref Range   Color, UA yellow    Clarity, UA clear    Glucose, UA Negative Negative   Bilirubin, UA Negative    Ketones, UA Negative    Spec Grav, UA 1.020 1.010 - 1.025   Blood, UA Negative     pH, UA 5.0 5.0 - 8.0   Protein, UA Negative Negative   Urobilinogen, UA 0.2 0.2 or 1.0 E.U./dL   Nitrite, UA Negative    Leukocytes, UA Negative Negative   Appearance     Odor         Assessment & Plan    1. Frequency of urination/Possible OAB Treat carefully with Ditropan and RTC 1 month. - POCT urinalysis dipstick  2. CA of prostate ([place]) I do not think urology consult needed at this time.  3. Malignant neoplasm of head of pancreas ([place]) S/p  Whipple more than 10 years ago.  4. Chronic recurrent pancreatitis ([place]) Due to above. More than 50% 25 minute visit spent in counseling or coordination of care  5. Coronary artery disease of bypass graft of native heart with stable angina pectoris ([place]) Risk factors treated. 6.TIIDM    Wilhemena Durie, MD  [facility]

## 2019-06-10 ENCOUNTER — Telehealth: Payer: Self-pay | Admitting: *Deleted

## 2019-06-10 NOTE — Telephone Encounter (Signed)
Patient called office stating rx for oxbutynin 5mg  was $141 out-of-pocket. Patient called [employer] who advised him to have [place] resend rx to [facility] so pt can get the rx cheaper with a GoodRx card.

## 2019-06-14 DIAGNOSIS — R69 Illness, unspecified: Secondary | ICD-10-CM | POA: Diagnosis not present

## 2019-06-14 MED ORDER — OXYBUTYNIN CHLORIDE ER 5 MG PO TB24: 5.0000 mg | ORAL_TABLET | Freq: Every day | ORAL | 11 refills | Status: DC

## 2019-06-14 NOTE — Telephone Encounter (Signed)
Please send in. Thanks!  

## 2019-06-14 NOTE — Telephone Encounter (Signed)
Ok to do so. Kristopher Oppenheim also honors these cards.

## 2019-07-11 ENCOUNTER — Ambulatory Visit: Payer: Self-pay | Admitting: Family Medicine

## 2019-08-02 ENCOUNTER — Other Ambulatory Visit: Payer: Self-pay | Admitting: Family Medicine

## 2019-08-09 ENCOUNTER — Other Ambulatory Visit: Payer: Self-pay | Admitting: Family Medicine

## 2019-08-09 MED ORDER — OXYBUTYNIN CHLORIDE ER 5 MG PO TB24: 5.0000 mg | ORAL_TABLET | Freq: Every day | ORAL | 11 refills | Status: DC

## 2019-08-09 NOTE — Addendum Note (Signed)
Addended by: Judie Petit on: 08/09/2019 03:22 PM   Modules accepted: Orders

## 2019-08-09 NOTE — Telephone Encounter (Signed)
The patient was referring to have his Oxybutynin 5mg  refilled and sent in to the [facility] in [place]. I have spoken with the patient to confirm he is needing this refill. The patient gave verbal understanding.

## 2019-08-09 NOTE — Telephone Encounter (Signed)
Kacy with [facility] called regarding pt called them requesting one of his medications be filled by them since the out of pocket cost is too much.  Pt did not give name. Pt needing a call back on the name of the medication because he didn't give the name.   [facility] [phone].  Thanks, [employer]

## 2019-08-12 NOTE — Progress Notes (Signed)
Patient: Anthony Craig Male    DOB: 1928-04-11   85 y.o.   MRN: [id number] Visit Date: 08/15/2019  Today's Provider: Wilhemena Durie, MD   Chief Complaint  Patient presents with  . Follow-up   Subjective:   HPI   Frequency of urination/Possible OAB From 06/08/2019-Treat carefully with Ditropan and RTC 1 month.  Patient states he stopped taking Ditropan after 2 months due to side effects. .   Pancreatic/biliary issues have finally improved--followed at [facility]. Allergies  Allergen Reactions  . Doxycycline Other (See Comments)    Reaction: per [facility] patient called on 11/30/14 stating he was having a reaction to a medication possibly including redness, swelling and itching, he was not sure at that time if it was this medication or Hydrocodone-homatropin.   Marland Kitchen Hydrocodone-Homatropine Other (See Comments)    Reaction:  Reaction: per [facility] patient called on 11/30/14 stating he was having a reaction to a medication possibly including redness, swelling and itching, he was not sure at that time if it was this medication or Doxy that he was taking at the same time. Patient has been taking Hydrocodone with no problems.     Current Outpatient Medications:  .  ACCU-CHEK SMARTVIEW test strip, CHECK SUGAR THREE TIMES DAILY, Disp: 300 each, Rfl: 4 .  Alcohol Swabs (B-D SINGLE USE SWABS REGULAR) PADS, USE AS DIRECTED  WITH  EACH  FINGERSTICK, Disp: 300 each, Rfl: 3 .  aspirin EC 81 MG tablet, Take 81 mg by mouth daily. , Disp: , Rfl:  .  atorvastatin (LIPITOR) 40 MG tablet, TAKE 1 TABLET BY MOUTH EVERYDAY AT BEDTIME, Disp: 90 tablet, Rfl: 3 .  b complex vitamins tablet, Take 1 tablet by mouth daily., Disp: , Rfl:  .  calcium-vitamin D 250-100 MG-UNIT per tablet, Take 1 tablet by mouth daily. , Disp: , Rfl:  .  cholecalciferol (VITAMIN D) 1000 UNITS tablet, Take 1,000 Units by mouth daily., Disp: , Rfl:  .  Coenzyme Q10 (COQ10) 200 MG CAPS, Take 1 capsule by mouth daily., Disp: ,  Rfl:  .  diphenhydrAMINE (BENADRYL) 25 MG tablet, Take 25 mg by mouth every 6 (six) hours as needed., Disp: , Rfl:  .  diphenoxylate-atropine (LOMOTIL) 2.5-0.025 MG per tablet, Take 1 tablet by mouth 4 (four) times daily as needed for diarrhea or loose stools., Disp: , Rfl:  .  fluocinonide (LIDEX) 0.05 % external solution, Apply 1 application topically daily. To affected areas of scalp, Disp: , Rfl:  .  HYDROcodone-acetaminophen (NORCO/VICODIN) 5-325 MG tablet, Take 1-2 tablets by mouth every 4 (four) hours as needed for moderate pain., Disp: 100 tablet, Rfl: 0 .  hydrocortisone 2.5 % lotion, Apply 1 application topically 2 (two) times daily. To affected areas of face only as needed for flares of itching/scaling, Disp: , Rfl:  .  hydroxypropyl methylcellulose / hypromellose (ISOPTO TEARS / GONIOVISC) 2.5 % ophthalmic solution, Place 1 drop into both eyes 4 (four) times daily as needed for dry eyes., Disp: , Rfl:  .  insulin aspart (NOVOLOG FLEXPEN) 100 UNIT/ML FlexPen, Inject 4 Units into the skin 3 (three) times daily with meals. 90 day supply. Generic ok. (Patient taking differently: Inject 7 Units into the skin 3 (three) times daily with meals. 90 day supply. Generic ok.), Disp: 15 mL, Rfl: 3 .  insulin glargine (LANTUS) 100 UNIT/ML injection, Inject 10 Units into the skin daily., Disp: , Rfl:  .  lipase/protease/amylase (CREON) 36000 UNITS CPEP capsule, Take  36,000 Units by mouth., Disp: , Rfl:  .  Multiple Vitamins-Minerals (MULTIVITAMIN PO), Take 1 tablet by mouth daily., Disp: , Rfl:  .  omega-3 acid ethyl esters (LOVAZA) 1 g capsule, Take by mouth 2 (two) times daily., Disp: , Rfl:  .  omeprazole (PRILOSEC) 20 MG capsule, Take 1 capsule (20 mg total) by mouth daily., Disp: 90 capsule, Rfl: 3 .  ONETOUCH VERIO test strip, CHECK BLOOD SUGAR 3 TIMES A DAY (DX E11.9), Disp: 300 strip, Rfl: 3 .  primidone (MYSOLINE) 50 MG tablet, Take 1 tablet (50 mg total) by mouth 4 (four) times daily. (Patient  taking differently: Take 50 mg by mouth at bedtime. ), Disp: 360 tablet, Rfl: 3 .  SODIUM CHLORIDE, GU IRRIGANT, IR, Irrigate with as directed. Irrigant every day flush drain once daily with 10 ML saline solution, Disp: , Rfl:  .  ursodiol (ACTIGALL) 300 MG capsule, Take 300 mg by mouth 2 (two) times daily., Disp: , Rfl:  .  vitamin B-12 (CYANOCOBALAMIN) 1000 MCG tablet, Take 1,000 mcg by mouth daily., Disp: , Rfl:  .  oxybutynin (DITROPAN XL) 5 MG 24 hr tablet, Take 1 tablet (5 mg total) by mouth at bedtime. (Patient not taking: Reported on 08/15/2019), Disp: 30 tablet, Rfl: 11  Review of Systems  Constitutional: Negative for activity change and fatigue.  HENT: Negative.   Eyes: Negative.   Respiratory: Negative.   Cardiovascular: Negative.   Gastrointestinal: Negative.  Negative for abdominal pain.  Endocrine: Negative.   Genitourinary: Negative for flank pain, frequency, penile pain and scrotal swelling.  Allergic/Immunologic: Negative.   Hematological: Negative.   Psychiatric/Behavioral: Negative.     Social History   Tobacco Use  . Smoking status: Former Smoker    Packs/day: 0.50    Years: 30.00    Pack years: 15.00    Quit date: 10/06/1979    Years since quitting: 39.8  . Smokeless tobacco: Never Used  Substance Use Topics  . Alcohol use: No    Comment: Maybe a beer or 2 a month      Objective:   BP 100/62 (BP Location: Right Arm, Patient Position: Sitting, Cuff Size: Large)   Pulse 74   Temp (!) 97.3 F (36.3 C) (Other (Comment))   Resp 18   Ht 5\' 11"  (1.803 m)   Wt 155 lb (70.3 kg)   SpO2 93%   BMI 21.62 kg/m  Vitals:   08/15/19 1500  BP: 100/62  Pulse: 74  Resp: 18  Temp: (!) 97.3 F (36.3 C)  TempSrc: Other (Comment)  SpO2: 93%  Weight: 155 lb (70.3 kg)  Height: 5\' 11"  (1.803 m)  Body mass index is 21.62 kg/m.   Physical Exam Vitals signs reviewed.  Constitutional:      Comments: Cachectic white male in no acute distress.  HENT:     Head:  Normocephalic and atraumatic.     Right Ear: External ear normal.     Left Ear: External ear normal.  Eyes:     General: No scleral icterus.    Conjunctiva/sclera: Conjunctivae normal.  Cardiovascular:     Rate and Rhythm: Normal rate and regular rhythm.     Heart sounds: Normal heart sounds.  Pulmonary:     Effort: Pulmonary effort is normal.     Breath sounds: Normal breath sounds.  Abdominal:     Palpations: Abdomen is soft.     Tenderness: There is no abdominal tenderness.  Musculoskeletal:     Right lower leg:  No edema.     Left lower leg: No edema.  Skin:    General: Skin is warm and dry.  Neurological:     Mental Status: He is alert and oriented to person, place, and time. Mental status is at baseline.  Psychiatric:        Mood and Affect: Mood normal.        Behavior: Behavior normal.        Thought Content: Thought content normal.        Judgment: Judgment normal.      No results found for any visits on 08/15/19.     Assessment & Plan    1. Type 2 diabetes mellitus with other circulatory complication, with long-term current use of insulin (HCC) More than 50% 25 minute visit spent in counseling or coordination of care  - Hemoglobin A1C - CBC w/Diff/Platelet - Lipid panel - Comprehensive Metabolic Panel (CMET)   - TSH  2. Hyperthyroidism  - Hemoglobin A1C - CBC w/Diff/Platelet - Lipid panel - Comprehensive Metabolic Panel (CMET) - TSH  3. Pure hypercholesterolemia  - Hemoglobin A1C - CBC w/Diff/Platelet - Lipid panel - Comprehensive Metabolic Panel (CMET) - TSH  4. Essential hypertension  - Hemoglobin A1C - CBC w/Diff/Platelet - Lipid panel - Comprehensive Metabolic Panel (CMET) - TSH  5. Malignant neoplasm of head of pancreas (HCC)  - Hemoglobin A1C - CBC w/Diff/Platelet - Lipid panel - Comprehensive Metabolic Panel (CMET) - TSH  6. Frequency of urination Stop oxybutynin (DITROPAN XL) 5 MG 24 hr tablet. - Hemoglobin A1C - CBC  w/Diff/Platelet - Lipid panel - Comprehensive Metabolic Panel (CMET) - TSH 7.s/p CABG        Wilhemena Durie, MD  [facility]

## 2019-08-15 ENCOUNTER — Ambulatory Visit (INDEPENDENT_AMBULATORY_CARE_PROVIDER_SITE_OTHER): Payer: Medicare HMO | Admitting: Family Medicine

## 2019-08-15 ENCOUNTER — Encounter: Payer: Self-pay | Admitting: Family Medicine

## 2019-08-15 ENCOUNTER — Other Ambulatory Visit: Payer: Self-pay

## 2019-08-15 VITALS — BP 100/62 | HR 74 | Temp 97.3°F | Resp 18 | Ht 71.0 in | Wt 155.0 lb

## 2019-08-15 DIAGNOSIS — E1159 Type 2 diabetes mellitus with other circulatory complications: Secondary | ICD-10-CM | POA: Diagnosis not present

## 2019-08-15 DIAGNOSIS — R350 Frequency of micturition: Secondary | ICD-10-CM | POA: Diagnosis not present

## 2019-08-15 DIAGNOSIS — I10 Essential (primary) hypertension: Secondary | ICD-10-CM | POA: Diagnosis not present

## 2019-08-15 DIAGNOSIS — I25708 Atherosclerosis of coronary artery bypass graft(s), unspecified, with other forms of angina pectoris: Secondary | ICD-10-CM

## 2019-08-15 DIAGNOSIS — C250 Malignant neoplasm of head of pancreas: Secondary | ICD-10-CM | POA: Diagnosis not present

## 2019-08-15 DIAGNOSIS — E0590 Thyrotoxicosis, unspecified without thyrotoxic crisis or storm: Secondary | ICD-10-CM | POA: Diagnosis not present

## 2019-08-15 DIAGNOSIS — Z794 Long term (current) use of insulin: Secondary | ICD-10-CM | POA: Diagnosis not present

## 2019-08-15 DIAGNOSIS — Z951 Presence of aortocoronary bypass graft: Secondary | ICD-10-CM

## 2019-08-15 DIAGNOSIS — E7800 Pure hypercholesterolemia, unspecified: Secondary | ICD-10-CM | POA: Diagnosis not present

## 2019-08-15 NOTE — Patient Instructions (Addendum)
Frequency of urination Stop oxybutynin (DITROPAN XL) 5 MG 24 hr tablet.   Follow up in 6 months.

## 2019-08-17 DIAGNOSIS — I10 Essential (primary) hypertension: Secondary | ICD-10-CM | POA: Diagnosis not present

## 2019-08-17 DIAGNOSIS — E1159 Type 2 diabetes mellitus with other circulatory complications: Secondary | ICD-10-CM | POA: Diagnosis not present

## 2019-08-17 DIAGNOSIS — C250 Malignant neoplasm of head of pancreas: Secondary | ICD-10-CM | POA: Diagnosis not present

## 2019-08-17 DIAGNOSIS — E0590 Thyrotoxicosis, unspecified without thyrotoxic crisis or storm: Secondary | ICD-10-CM | POA: Diagnosis not present

## 2019-08-17 DIAGNOSIS — Z794 Long term (current) use of insulin: Secondary | ICD-10-CM | POA: Diagnosis not present

## 2019-08-17 DIAGNOSIS — E7800 Pure hypercholesterolemia, unspecified: Secondary | ICD-10-CM | POA: Diagnosis not present

## 2019-08-17 DIAGNOSIS — R350 Frequency of micturition: Secondary | ICD-10-CM | POA: Diagnosis not present

## 2019-08-18 LAB — CBC WITH DIFFERENTIAL/PLATELET
Basophils Absolute: 0 10*3/uL (ref 0.0–0.2)
Basos: 0 %
EOS (ABSOLUTE): 0.2 10*3/uL (ref 0.0–0.4)
Eos: 3 %
Hematocrit: 42.1 % (ref 37.5–51.0)
Hemoglobin: 13.5 g/dL (ref 13.0–17.7)
Immature Grans (Abs): 0 10*3/uL (ref 0.0–0.1)
Immature Granulocytes: 0 %
Lymphocytes Absolute: 0.7 10*3/uL (ref 0.7–3.1)
Lymphs: 15 %
MCH: 25.3 pg — ABNORMAL LOW (ref 26.6–33.0)
MCHC: 32.1 g/dL (ref 31.5–35.7)
MCV: 79 fL (ref 79–97)
Monocytes Absolute: 0.5 10*3/uL (ref 0.1–0.9)
Monocytes: 10 %
Neutrophils Absolute: 3.3 10*3/uL (ref 1.4–7.0)
Neutrophils: 72 %
Platelets: 182 10*3/uL (ref 150–450)
RBC: 5.34 x10E6/uL (ref 4.14–5.80)
RDW: 14.8 % (ref 11.6–15.4)
WBC: 4.7 10*3/uL (ref 3.4–10.8)

## 2019-08-18 LAB — COMPREHENSIVE METABOLIC PANEL
ALT: 19 IU/L (ref 0–44)
AST: 20 IU/L (ref 0–40)
Albumin/Globulin Ratio: 1.7 (ref 1.2–2.2)
Albumin: 4.2 g/dL (ref 3.6–4.6)
Alkaline Phosphatase: 95 IU/L (ref 39–117)
BUN/Creatinine Ratio: 16 (ref 10–24)
BUN: 17 mg/dL (ref 8–27)
Bilirubin Total: 0.4 mg/dL (ref 0.0–1.2)
CO2: 23 mmol/L (ref 20–29)
Calcium: 9.7 mg/dL (ref 8.6–10.2)
Chloride: 101 mmol/L (ref 96–106)
Creatinine, Ser: 1.04 mg/dL (ref 0.76–1.27)
GFR calc Af Amer: 73 mL/min/{1.73_m2} (ref >59)
GFR calc non Af Amer: 63 mL/min/{1.73_m2} (ref >59)
Globulin, Total: 2.5 g/dL (ref 1.5–4.5)
Glucose: 160 mg/dL — ABNORMAL HIGH (ref 65–99)
Potassium: 4.7 mmol/L (ref 3.5–5.2)
Sodium: 136 mmol/L (ref 134–144)
Total Protein: 6.7 g/dL (ref 6.0–8.5)

## 2019-08-18 LAB — LIPID PANEL
Chol/HDL Ratio: 3.4 ratio (ref 0.0–5.0)
Cholesterol, Total: 148 mg/dL (ref 100–199)
HDL: 43 mg/dL (ref >39)
LDL Chol Calc (NIH): 88 mg/dL (ref 0–99)
Triglycerides: 88 mg/dL (ref 0–149)
VLDL Cholesterol Cal: 17 mg/dL (ref 5–40)

## 2019-08-18 LAB — HEMOGLOBIN A1C
Est. average glucose Bld gHb Est-mCnc: 166 mg/dL
Hgb A1c MFr Bld: 7.4 % — ABNORMAL HIGH (ref 4.8–5.6)

## 2019-08-18 LAB — TSH: TSH: 1.22 u[IU]/mL (ref 0.450–4.500)

## 2019-08-19 ENCOUNTER — Telehealth: Payer: Self-pay

## 2019-08-19 NOTE — Telephone Encounter (Signed)
Pt requesting a copy of his labs to be mailed to him.  Thanks, [employer]

## 2019-08-19 NOTE — Telephone Encounter (Signed)
His results have been printed and are in the slot to be mailed to the patient.

## 2019-08-19 NOTE — Telephone Encounter (Signed)
Called and Lvm about labs being stable.

## 2019-08-19 NOTE — Telephone Encounter (Signed)
-----   Message from Jerrol Banana., MD sent at 08/19/2019  8:46 AM EST ----- Labs good

## 2019-08-22 ENCOUNTER — Ambulatory Visit: Payer: Self-pay | Admitting: Family Medicine

## 2019-08-26 DIAGNOSIS — Z20828 Contact with and (suspected) exposure to other viral communicable diseases: Secondary | ICD-10-CM | POA: Diagnosis not present

## 2019-10-12 DIAGNOSIS — K219 Gastro-esophageal reflux disease without esophagitis: Secondary | ICD-10-CM | POA: Diagnosis not present

## 2019-10-12 DIAGNOSIS — I252 Old myocardial infarction: Secondary | ICD-10-CM | POA: Diagnosis not present

## 2019-10-12 DIAGNOSIS — E1165 Type 2 diabetes mellitus with hyperglycemia: Secondary | ICD-10-CM | POA: Diagnosis not present

## 2019-10-12 DIAGNOSIS — I2510 Atherosclerotic heart disease of native coronary artery without angina pectoris: Secondary | ICD-10-CM | POA: Diagnosis not present

## 2019-10-12 DIAGNOSIS — Z008 Encounter for other general examination: Secondary | ICD-10-CM | POA: Diagnosis not present

## 2019-10-12 DIAGNOSIS — E1151 Type 2 diabetes mellitus with diabetic peripheral angiopathy without gangrene: Secondary | ICD-10-CM | POA: Diagnosis not present

## 2019-10-12 DIAGNOSIS — Z794 Long term (current) use of insulin: Secondary | ICD-10-CM | POA: Diagnosis not present

## 2019-10-12 DIAGNOSIS — E8809 Other disorders of plasma-protein metabolism, not elsewhere classified: Secondary | ICD-10-CM | POA: Diagnosis not present

## 2019-10-12 DIAGNOSIS — Z8507 Personal history of malignant neoplasm of pancreas: Secondary | ICD-10-CM | POA: Diagnosis not present

## 2019-10-12 DIAGNOSIS — R030 Elevated blood-pressure reading, without diagnosis of hypertension: Secondary | ICD-10-CM | POA: Diagnosis not present

## 2019-10-12 DIAGNOSIS — R251 Tremor, unspecified: Secondary | ICD-10-CM | POA: Diagnosis not present

## 2019-10-17 ENCOUNTER — Emergency Department
Admission: EM | Admit: 2019-10-17 | Discharge: 2019-10-17 | Disposition: A | Discharge status: Home / Self Care | Payer: Medicare HMO | Attending: Emergency Medicine | Admitting: Emergency Medicine

## 2019-10-17 ENCOUNTER — Emergency Department: Payer: Medicare HMO

## 2019-10-17 ENCOUNTER — Other Ambulatory Visit: Payer: Self-pay

## 2019-10-17 DIAGNOSIS — Z8546 Personal history of malignant neoplasm of prostate: Secondary | ICD-10-CM | POA: Diagnosis not present

## 2019-10-17 DIAGNOSIS — E119 Type 2 diabetes mellitus without complications: Secondary | ICD-10-CM | POA: Insufficient documentation

## 2019-10-17 DIAGNOSIS — Z951 Presence of aortocoronary bypass graft: Secondary | ICD-10-CM | POA: Insufficient documentation

## 2019-10-17 DIAGNOSIS — R1013 Epigastric pain: Secondary | ICD-10-CM | POA: Insufficient documentation

## 2019-10-17 DIAGNOSIS — I25810 Atherosclerosis of coronary artery bypass graft(s) without angina pectoris: Secondary | ICD-10-CM | POA: Insufficient documentation

## 2019-10-17 DIAGNOSIS — Z87891 Personal history of nicotine dependence: Secondary | ICD-10-CM | POA: Insufficient documentation

## 2019-10-17 DIAGNOSIS — I10 Essential (primary) hypertension: Secondary | ICD-10-CM | POA: Diagnosis not present

## 2019-10-17 DIAGNOSIS — R079 Chest pain, unspecified: Secondary | ICD-10-CM | POA: Diagnosis not present

## 2019-10-17 DIAGNOSIS — Z79899 Other long term (current) drug therapy: Secondary | ICD-10-CM | POA: Insufficient documentation

## 2019-10-17 DIAGNOSIS — R748 Abnormal levels of other serum enzymes: Secondary | ICD-10-CM

## 2019-10-17 DIAGNOSIS — Z8507 Personal history of malignant neoplasm of pancreas: Secondary | ICD-10-CM | POA: Diagnosis not present

## 2019-10-17 LAB — COMPREHENSIVE METABOLIC PANEL
ALT: 42 U/L (ref 0–44)
AST: 95 U/L — ABNORMAL HIGH (ref 15–41)
Albumin: 4.2 g/dL (ref 3.5–5.0)
Alkaline Phosphatase: 93 U/L (ref 38–126)
Anion gap: 10 (ref 5–15)
BUN: 21 mg/dL (ref 8–23)
CO2: 23 mmol/L (ref 22–32)
Calcium: 9.2 mg/dL (ref 8.9–10.3)
Chloride: 103 mmol/L (ref 98–111)
Creatinine, Ser: 0.85 mg/dL (ref 0.61–1.24)
GFR calc Af Amer: >60 mL/min (ref >60)
GFR calc non Af Amer: >60 mL/min (ref >60)
Glucose, Bld: 145 mg/dL — ABNORMAL HIGH (ref 70–99)
Potassium: 4 mmol/L (ref 3.5–5.1)
Sodium: 136 mmol/L (ref 135–145)
Total Bilirubin: 0.9 mg/dL (ref 0.3–1.2)
Total Protein: 7.1 g/dL (ref 6.5–8.1)

## 2019-10-17 LAB — CBC
HCT: 44.2 % (ref 39.0–52.0)
Hemoglobin: 14.4 g/dL (ref 13.0–17.0)
MCH: 26.2 pg (ref 26.0–34.0)
MCHC: 32.6 g/dL (ref 30.0–36.0)
MCV: 80.4 fL (ref 80.0–100.0)
Platelets: 207 10*3/uL (ref 150–400)
RBC: 5.5 MIL/uL (ref 4.22–5.81)
RDW: 15.9 % — ABNORMAL HIGH (ref 11.5–15.5)
WBC: 10.3 10*3/uL (ref 4.0–10.5)
nRBC: 0 % (ref 0.0–0.2)

## 2019-10-17 LAB — TROPONIN I (HIGH SENSITIVITY)
Troponin I (High Sensitivity): 6 ng/L (ref <18)
Troponin I (High Sensitivity): 8 ng/L (ref <18)

## 2019-10-17 LAB — LIPASE, BLOOD: Lipase: 63 U/L — ABNORMAL HIGH (ref 11–51)

## 2019-10-17 MED ORDER — OXYCODONE HCL 5 MG PO TABS: 2.5000 mg | ORAL_TABLET | Freq: Three times a day (TID) | ORAL | 0 refills | Status: AC | PRN

## 2019-10-17 MED ORDER — ALUM & MAG HYDROXIDE-SIMETH 200-200-20 MG/5ML PO SUSP
30.0000 mL | Freq: Once | ORAL | Status: AC
  Administered 2019-10-17: 30 mL via ORAL
  Filled 2019-10-17: qty 30

## 2019-10-17 MED ORDER — OXYCODONE HCL 5 MG PO TABS: 2.5000 mg | ORAL_TABLET | Freq: Three times a day (TID) | ORAL | 0 refills | Status: DC | PRN

## 2019-10-17 MED ORDER — ACETAMINOPHEN 325 MG PO TABS
650.0000 mg | ORAL_TABLET | Freq: Once | ORAL | Status: AC
  Administered 2019-10-17: 650 mg via ORAL
  Filled 2019-10-17: qty 2

## 2019-10-17 MED ORDER — LIDOCAINE VISCOUS HCL 2 % MT SOLN
15.0000 mL | Freq: Once | OROMUCOSAL | Status: AC
  Administered 2019-10-17: 15 mL via ORAL
  Filled 2019-10-17: qty 15

## 2019-10-17 NOTE — ED Triage Notes (Addendum)
Reports sudden onset of epigastric pain and diarrhea that started approx 1hour ago. Reports 9/10 pain when chest pain occurred, states 2/10 currently. Pt alert and oriented X4, cooperative, RR even and unlabored, color WNL. Pt in NAD.   Pt took Norco PTA.

## 2019-10-17 NOTE — ED Provider Notes (Signed)
[facility] Emergency Department Provider Note  ____________________________________________   First MD Initiated Contact with Patient 10/17/19 1832     (approximate)  I have reviewed the triage vital signs and the nursing notes.   HISTORY  Chief Complaint Chest Pain    HPI Anthony Craig is a 86 y.o. male pancreatic cancer status post Whipple procedure at [facility] about 12 years ago and a prior coronary artery bypass.  He presents for evaluation of acute onset upper abdominal pain tonight that feels similar to some of the prior pain he experienced.  It sounds like had had a biliary stricture and had a biliary drain for a while.  He states that he been doing well for the past year or so then today he started having some of the epigastric pain similar to prior.  The pain is epigastric, intermittent, nothing in particular brings it on can occur at rest, nothing seems to make it worse.  He states that previously he had been given some oxycodone that he would take if the pain came on and that would help him a lot.  He does not have any currently.  He states that he can follow-up with his GI doctor.  Also some associated loose stools with it.          Past Medical History:  Diagnosis Date  . Cancer [facility])    pancreatic  . Coronary artery disease   . Diabetes mellitus without complication ([place])    type 2  . Hyperlipidemia   . Hypertension     Patient Active Problem List   Diagnosis Date Noted  . Chest pain 04/27/2015  . Allergic drug reaction 02/08/2015  . 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 B12 deficiency 02/08/2015  . 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 Chest discomfort 02/08/2015  . Diabetes ([place]) 02/08/2015  . Dizziness 02/08/2015  . Benign essential tremor 02/08/2015  . Fatigue 02/08/2015  . Acid reflux 02/08/2015  . HLD (hyperlipidemia) 02/08/2015  . BP (high blood pressure)  02/08/2015  . Hyperthyroidism 02/08/2015  . Hernia, inguinal, left 02/08/2015  . Cannot sleep 02/08/2015  . Cancer of pancreas ([place]) 02/08/2015  . Pancreatitis 02/08/2015  . CA of prostate ([place]) 02/08/2015    Past Surgical History:  Procedure Laterality Date  . APPENDECTOMY     1977  . BILIARY DRAINAGE    . CORONARY ARTERY BYPASS GRAFT     2001-3 vessel  . HERNIA REPAIR Bilateral    2016-left, 2009-right  . WHIPPLE PROCEDURE  2007    Prior to Admission medications   Medication Sig Start Date End Date Taking? Authorizing Provider  ACCU-CHEK SMARTVIEW test strip CHECK SUGAR THREE TIMES DAILY 04/28/16   Chrismon, Vickki Muff, PA  Alcohol Swabs (B-D SINGLE USE SWABS REGULAR) PADS USE AS DIRECTED  WITH  EACH  FINGERSTICK 12/24/15   Jerrol Banana., MD  aspirin EC 81 MG tablet Take 81 mg by mouth daily.     [provider]  atorvastatin (LIPITOR) 40 MG tablet TAKE 1 TABLET BY MOUTH EVERYDAY AT BEDTIME 12/26/18   Jerrol Banana., MD  b complex vitamins tablet Take 1 tablet by mouth daily.    [provider]  calcium-vitamin D 250-100 MG-UNIT per tablet Take 1 tablet by mouth daily.     [provider]  cholecalciferol (VITAMIN D) 1000 UNITS tablet Take 1,000 Units by mouth daily.    [provider]  Coenzyme Q10 (COQ10) 200 MG CAPS Take 1 capsule by mouth daily.    [provider]  diphenhydrAMINE (BENADRYL) 25 MG tablet Take 25 mg by mouth every 6 (six) hours as needed.    [provider]  diphenoxylate-atropine (LOMOTIL) 2.5-0.025 MG per tablet Take 1 tablet by mouth 4 (four) times daily as needed for diarrhea or loose stools.    [provider]  fluocinonide (LIDEX) 0.05 % external solution Apply 1 application topically daily. To affected areas of scalp    [provider]  HYDROcodone-acetaminophen (NORCO/VICODIN) 5-325 MG tablet Take 1-2 tablets by mouth every 4 (four) hours as needed for moderate pain.  04/16/18   Jerrol Banana., MD  hydrocortisone 2.5 % lotion Apply 1 application topically 2 (two) times daily. To affected areas of face only as needed for flares of itching/scaling    [provider]  hydroxypropyl methylcellulose / hypromellose (ISOPTO TEARS / GONIOVISC) 2.5 % ophthalmic solution Place 1 drop into both eyes 4 (four) times daily as needed for dry eyes.    [provider]  insulin aspart (NOVOLOG FLEXPEN) 100 UNIT/ML FlexPen Inject 4 Units into the skin 3 (three) times daily with meals. 90 day supply. Generic ok. Patient taking differently: Inject 7 Units into the skin 3 (three) times daily with meals. 90 day supply. Generic ok. 01/21/16   Jerrol Banana., MD  insulin glargine (LANTUS) 100 UNIT/ML injection Inject 10 Units into the skin daily.    [provider]  lipase/protease/amylase (CREON) 36000 UNITS CPEP capsule Take 36,000 Units by mouth.    [provider]  Multiple Vitamins-Minerals (MULTIVITAMIN PO) Take 1 tablet by mouth daily.    [provider]  omega-3 acid ethyl esters (LOVAZA) 1 g capsule Take by mouth 2 (two) times daily.    [provider]  omeprazole (PRILOSEC) 20 MG capsule Take 1 capsule (20 mg total) by mouth daily. 04/04/19   Jerrol Banana., MD  [facility] VERIO test strip CHECK BLOOD SUGAR 3 TIMES A DAY (DX E11.9) 08/03/19   Jerrol Banana., MD  oxybutynin (DITROPAN XL) 5 MG 24 hr tablet Take 1 tablet (5 mg total) by mouth at bedtime. Patient not taking: Reported on 08/15/2019 08/09/19   Jerrol Banana., MD  primidone (MYSOLINE) 50 MG tablet Take 1 tablet (50 mg total) by mouth 4 (four) times daily. Patient taking differently: Take 50 mg by mouth at bedtime.  12/02/16   Jerrol Banana., MD  SODIUM CHLORIDE, GU IRRIGANT, IR Irrigate with as directed. Irrigant every day flush drain once daily with 10 ML saline solution    [provider]  ursodiol (ACTIGALL) 300 MG  capsule Take 300 mg by mouth 2 (two) times daily.    [provider]  vitamin B-12 (CYANOCOBALAMIN) 1000 MCG tablet Take 1,000 mcg by mouth daily.    [provider]    Allergies Doxycycline and Hydrocodone-homatropine  Family History  Problem Relation Age of Onset  . Heart disease Mother   . Cancer Mother 9       breast  . Heart disease Father   . COPD Brother   . Stroke Brother   . Diabetes Other        1 and 2    Social History Social History   Tobacco Use  . Smoking status: Former Smoker    Packs/day: 0.50    Years: 30.00    Pack years: 15.00  Quit date: 10/06/1979    Years since quitting: 40.0  . Smokeless tobacco: Never Used  Substance Use Topics  . Alcohol use: No    Comment: Maybe a beer or 2 a month  . Drug use: No      Review of Systems Constitutional: No fever/chills Eyes: No visual changes. ENT: No sore throat. Cardiovascular: Positive chest pain Respiratory: Denies shortness of breath. Gastrointestinal: Positive upper abdominal pain.  No constipation. Genitourinary: Negative for dysuria. Musculoskeletal: Negative for back pain. Skin: Negative for rash. Neurological: Negative for headaches, focal weakness or numbness. All other ROS negative ____________________________________________   PHYSICAL EXAM:  VITAL SIGNS: ED Triage Vitals [10/17/19 1613]  Enc Vitals Group     BP (!) 126/57     Pulse Rate 60     Resp 18     Temp 97.8 F (36.6 C)     Temp Source Oral     SpO2 98 %     Weight 150 lb (68 kg)     Height 5\' 11"  (1.803 m)     Head Circumference      Peak Flow      Pain Score 2     Pain Loc      Pain Edu?      Excl. in [place]?     Constitutional: Alert and oriented. Well appearing and in no acute distress. Eyes: Conjunctivae are normal. EOMI. Head: Atraumatic. Nose: No congestion/rhinnorhea. Mouth/Throat: Mucous membranes are moist.   Neck: No stridor. Trachea Midline. FROM Cardiovascular: Normal rate,  regular rhythm. Grossly normal heart sounds.  Good peripheral circulation. Respiratory: Normal respiratory effort.  No retractions. Lungs CTAB. Gastrointestinal: Soft and nontender. No distention. No abdominal bruits.  No rebound, no guarding. Musculoskeletal: No lower extremity tenderness nor edema.  No joint effusions. Neurologic:  Normal speech and language. No gross focal neurologic deficits are appreciated.  Skin:  Skin is warm, dry and intact. No rash noted. Psychiatric: Mood and affect are normal. Speech and behavior are normal. GU: Deferred   ____________________________________________   LABS (all labs ordered are listed, but only abnormal results are displayed)  Labs Reviewed  CBC - Abnormal; Notable for the following components:      Result Value   RDW 15.9 (*)    All other components within normal limits  COMPREHENSIVE METABOLIC PANEL - Abnormal; Notable for the following components:   Glucose, Bld 145 (*)    AST 95 (*)    All other components within normal limits  LIPASE, BLOOD  TROPONIN I (HIGH SENSITIVITY)  TROPONIN I (HIGH SENSITIVITY)   ____________________________________________   ED ECG REPORT I, Vanessa Belle Rive, the attending physician, personally viewed and interpreted this ECG.  EKG is being read as A. fib but appears to be sinus with a PAC, no ST elevations, no T wave inversions, right bundle branch block with type I AV block..  Reviewed patient's prior EKGs that show similar right bundle branch block and first-degree AV block although T wave inversions in V2 and V3 are now upright in today's EKG.   ____________________________________________  RADIOLOGY Robert Bellow, personally viewed and evaluated these images (plain radiographs) as part of my medical decision making, as well as reviewing the written report by the radiologist.  ED MD interpretation: No pneumonia  Official radiology report(s): DG Chest 2 View  Result Date: 10/17/2019 CLINICAL  DATA:  Chest pain EXAM: CHEST - 2 VIEW COMPARISON:  04/27/2015 FINDINGS: Prior CABG. Heart is normal size. No confluent opacities  or effusions. No acute bony abnormality. IMPRESSION: No active cardiopulmonary disease. Electronically Signed   By: Rolm Baptise M.D.   On: 10/17/2019 16:46    ____________________________________________   PROCEDURES  Procedure(s) performed (including Critical Care):  Procedures   ____________________________________________   INITIAL IMPRESSION / ASSESSMENT AND PLAN / ED COURSE   Anthony Craig was evaluated in Emergency Department on 10/17/2019 for the symptoms described in the history of present illness. He was evaluated in the context of the global COVID-19 pandemic, which necessitated consideration that the patient might be at risk for infection with the SARS-CoV-2 virus that causes COVID-19. Institutional protocols and algorithms that pertain to the evaluation of patients at risk for COVID-19 are in a state of rapid change based on information released by regulatory bodies including the CDC and federal and state organizations. These policies and algorithms were followed during the patient's care in the ED.    Patient's pain seems to be similar to his prior episodes of abdominal pain.  However given age and risk factors will get EKG and cardiac markers to rule out ACS.  Will get liver function and lipase given the upper abdominal pain and history of biliary stricture.  Abdomen is soft and nontender so low suspicion for perforation, obstruction.  DDx that was also considered d/t potential to cause harm, but was found less likely based on history and physical (as detailed above): -PNA (no fevers, cough but CXR to evaluate) -PNX (reassured with equal b/l breath sounds, CXR to evaluate) -Symptomatic anemia (will get H&H) -Pulmonary embolism as no sob at rest, not pleuritic in nature, no hypoxia -Aortic Dissection as no tearing pain and no radiation to  the mid back, pulses equal -Pericarditis no rub on exam, EKG changes or hx to suggest dx -Tamponade (no notable SOB, tachycardic, hypotensive) -Esophageal rupture (no h/o diffuse vomitting/no crepitus)   Labs show normal white count making infection less likely.  No evidence of anemia.  Initial troponin was 6.  AST slightly elevated although he has had some elevations previously.  Chest x-ray no evidence of pneumonia.  Discussed with patient that his AST was slightly elevated.  We discussed CT imaging but he like to hold off at this time.  He states that he prefer to call his GI doctor and to get follow-up.  I think this is reasonable given patient's abdomen is soft and nontender at this time with only slight liver function elevation.  Repeat cardiac marker was stable at 8  Lipase elevated 63.  8:42 PM Reevaluated patient and his pain is completely resolved.  He is feeling much better.  He feels comfortable with following up with his GI doctor at this time.  I discussed the provisional nature of ED diagnosis, the treatment so far, the ongoing plan of care, follow up appointments and return precautions with the patient and any family or support people present. They expressed understanding and agreed with the plan, discharged home.   ____________________________________________   FINAL CLINICAL IMPRESSION(S) / ED DIAGNOSES   Final diagnoses:  Epigastric abdominal pain  Elevated lipase     MEDICATIONS GIVEN DURING THIS VISIT:  Medications  acetaminophen (TYLENOL) tablet 650 mg (650 mg Oral Given 10/17/19 1933)  alum & mag hydroxide-simeth (MAALOX/MYLANTA) 200-200-20 MG/5ML suspension 30 mL (30 mLs Oral Given 10/17/19 1933)    And  lidocaine (XYLOCAINE) 2 % viscous mouth solution 15 mL (15 mLs Oral Given 10/17/19 1933)     ED Discharge Orders  Ordered    oxyCODONE (ROXICODONE) 5 MG immediate release tablet  Every 8 hours PRN     10/17/19 2023           Note:  This  document was prepared using Dragon voice recognition software and may include unintentional dictation errors.   Vanessa Dundee, MD 10/17/19 2042

## 2019-10-17 NOTE — ED Notes (Signed)
Pt states having central pain beneath his sternum that feels similar to the pain he's had with a previous pancreatitis.

## 2019-10-17 NOTE — Discharge Instructions (Addendum)
Your AST was slightly elevated at 95. ALT normal at 42 and lipase slightly elevated at 63. This could be a sign of you having the biliary stricture again.  You should follow-up with your GI doctor tomorrow to see if you need to have another dilation.  In the meantime continue to take your acid reducer.  I have given you just a few of the stronger pain medicine called oxycodone to use for breakthrough pain until he can get follow-up since this helped you previously.  Make sure that it does not cause any dizziness.  Do not drive while on this.  Start off with a half a pill but if your pain is getting worse you can take up to 1 pill.  Return to the ER if you develop fevers, vomiting, worsening pain or any other concerns.  You can take 1 capful of MiraLAX a day to help prevent constipation while on the stronger pain medicine

## 2019-10-17 NOTE — ED Notes (Addendum)
At end of triage pt c/o pain worsening to epigastric area. Pt went to xray and upon returning to ER pain had subsided again.

## 2019-10-17 NOTE — ED Notes (Signed)
Pt signed physical copy of discharge papers.

## 2019-10-18 ENCOUNTER — Telehealth: Payer: Self-pay

## 2019-10-18 NOTE — Telephone Encounter (Signed)
Copied from [place] [id number]. Topic: Appointment Scheduling - Scheduling Inquiry for Clinic >> Oct 18, 2019  3:29 PM Scherrie Gerlach wrote: Reason for CRM: pt requesting a hospital follow up appt with dr Rosanna Randy asap.  Pt went to ED 10/17/19 and advised to follow up with his pcp

## 2019-10-19 DIAGNOSIS — R69 Illness, unspecified: Secondary | ICD-10-CM | POA: Diagnosis not present

## 2019-10-19 NOTE — Telephone Encounter (Signed)
Appointment has been scheduled.

## 2019-10-20 ENCOUNTER — Ambulatory Visit: Payer: Medicare Other | Attending: Internal Medicine

## 2019-10-20 DIAGNOSIS — Z23 Encounter for immunization: Secondary | ICD-10-CM | POA: Insufficient documentation

## 2019-10-20 NOTE — Progress Notes (Signed)
   Covid-19 Vaccination Clinic  Name:  Tighe Weng    MRN: [id number] DOB: 1928/04/23  10/20/2019  Mr. Difalco was observed post Covid-19 immunization for 15 minutes without incidence. He was provided with Vaccine Information Sheet and instruction to access the V-Safe system.   Mr. Etheridge was instructed to call 911 with any severe reactions post vaccine: Marland Kitchen Difficulty breathing  . Swelling of your face and throat  . A fast heartbeat  . A bad rash all over your body  . Dizziness and weakness    Immunizations Administered    Name Date Dose VIS Date Route   Pfizer COVID-19 Vaccine 10/20/2019  9:15 AM 0.3 mL 09/16/2019 Intramuscular   Manufacturer: [employer], [employer]   Lot: [id number]   [employer]: [id number]

## 2019-10-23 ENCOUNTER — Ambulatory Visit: Payer: Medicare HMO

## 2019-11-08 ENCOUNTER — Ambulatory Visit: Payer: Medicare HMO | Attending: Internal Medicine

## 2019-11-08 DIAGNOSIS — Z23 Encounter for immunization: Secondary | ICD-10-CM | POA: Insufficient documentation

## 2019-11-08 NOTE — Progress Notes (Signed)
   Covid-19 Vaccination Clinic  Name:  Anthony Craig    MRN: [id number] DOB: 03-14-1928  11/08/2019  Mr. [name] was observed post Covid-19 immunization for 15 minutes without incidence. He was provided with Vaccine Information Sheet and instruction to access the V-Safe system.   Mr. [name] was instructed to call 911 with any severe reactions post vaccine: Marland Kitchen Difficulty breathing  . Swelling of your face and throat  . A fast heartbeat  . A bad rash all over your body  . Dizziness and weakness    Immunizations Administered    Name Date Dose VIS Date Route   Pfizer COVID-19 Vaccine 11/08/2019 10:08 AM 0.3 mL 09/16/2019 Intramuscular   Manufacturer: [place]   Lot: [id number]   [place]: [id number]

## 2019-11-23 ENCOUNTER — Ambulatory Visit (INDEPENDENT_AMBULATORY_CARE_PROVIDER_SITE_OTHER): Payer: Medicare HMO | Admitting: Family Medicine

## 2019-11-23 ENCOUNTER — Encounter: Payer: Self-pay | Admitting: Family Medicine

## 2019-11-23 ENCOUNTER — Other Ambulatory Visit: Payer: Self-pay

## 2019-11-23 VITALS — BP 116/68 | HR 53 | Temp 97.1°F | Resp 15 | Wt 157.2 lb

## 2019-11-23 DIAGNOSIS — E1159 Type 2 diabetes mellitus with other circulatory complications: Secondary | ICD-10-CM

## 2019-11-23 DIAGNOSIS — Z794 Long term (current) use of insulin: Secondary | ICD-10-CM | POA: Diagnosis not present

## 2019-11-23 DIAGNOSIS — C250 Malignant neoplasm of head of pancreas: Secondary | ICD-10-CM

## 2019-11-23 DIAGNOSIS — R109 Unspecified abdominal pain: Secondary | ICD-10-CM | POA: Diagnosis not present

## 2019-11-23 DIAGNOSIS — K861 Other chronic pancreatitis: Secondary | ICD-10-CM | POA: Diagnosis not present

## 2019-11-23 DIAGNOSIS — C61 Malignant neoplasm of prostate: Secondary | ICD-10-CM

## 2019-11-23 DIAGNOSIS — Z951 Presence of aortocoronary bypass graft: Secondary | ICD-10-CM | POA: Diagnosis not present

## 2019-11-23 NOTE — Progress Notes (Signed)
Patient: Anthony Craig Male    DOB: 06-28-1928   86 y.o.   MRN: [id number] Visit Date: 11/23/2019  Today's Provider: Wilhemena Durie, MD   Chief Complaint  Patient presents with  . Follow-up   Subjective:     HPI    Follow up ER visit He has done well and felt well since then.  Work-up was entirely negative.  He is actually felt well since the day of evaluation in the ED and has had no further problems. Patient was seen in ER for Chest pain on 10/17/2019. He was treated for Chest pain and upper abdominal pain. Treatment for this included; EKG, CXR, Labs. He reports adequate compliance with treatment, patient states that he was prescribed Hydrocodone-Acetaminophen for pain relief and states since hospital visit he has not used prescription. He reports this condition is Improved and denies any episodes of chest pain  Patient would also like to address at todays visit is his concerns of poor circulation in his extremities. Patient states that his insurance [employer] had sent out a home health nurse to check on patient last month and she voiced this concern to patient.  He has been followed at the [facility] for multiple issues throughout the years. He is status post CABG in 2001 and Whipple procedure for pancreatic cancer in 2007. ------------------------------------------------------------------------------------    Allergies  Allergen Reactions  . Doxycycline Other (See Comments)    Reaction: per [facility] patient called on 11/30/14 stating he was having a reaction to a medication possibly including redness, swelling and itching, he was not sure at that time if it was this medication or Hydrocodone-homatropin.   Marland Kitchen Hydrocodone-Homatropine Other (See Comments)    Reaction:  Reaction: per [facility] patient called on 11/30/14 stating he was having a reaction to a medication possibly including redness, swelling and itching, he was not sure at that time if it was this medication or Doxy  that he was taking at the same time. Patient has been taking Hydrocodone with no problems.     Current Outpatient Medications:  .  ACCU-CHEK SMARTVIEW test strip, CHECK SUGAR THREE TIMES DAILY, Disp: 300 each, Rfl: 4 .  Alcohol Swabs (B-D SINGLE USE SWABS REGULAR) PADS, USE AS DIRECTED  WITH  EACH  FINGERSTICK, Disp: 300 each, Rfl: 3 .  aspirin EC 81 MG tablet, Take 81 mg by mouth daily. , Disp: , Rfl:  .  atorvastatin (LIPITOR) 40 MG tablet, TAKE 1 TABLET BY MOUTH EVERYDAY AT BEDTIME, Disp: 90 tablet, Rfl: 3 .  b complex vitamins tablet, Take 1 tablet by mouth daily., Disp: , Rfl:  .  calcium-vitamin D 250-100 MG-UNIT per tablet, Take 1 tablet by mouth daily. , Disp: , Rfl:  .  cholecalciferol (VITAMIN D) 1000 UNITS tablet, Take 1,000 Units by mouth daily., Disp: , Rfl:  .  Coenzyme Q10 (COQ10) 200 MG CAPS, Take 1 capsule by mouth daily., Disp: , Rfl:  .  diphenhydrAMINE (BENADRYL) 25 MG tablet, Take 25 mg by mouth every 6 (six) hours as needed., Disp: , Rfl:  .  diphenoxylate-atropine (LOMOTIL) 2.5-0.025 MG per tablet, Take 1 tablet by mouth 4 (four) times daily as needed for diarrhea or loose stools., Disp: , Rfl:  .  fluocinonide (LIDEX) 0.05 % external solution, Apply 1 application topically daily. To affected areas of scalp, Disp: , Rfl:  .  hydrocortisone 2.5 % lotion, Apply 1 application topically 2 (two) times daily. To affected areas of face only  as needed for flares of itching/scaling, Disp: , Rfl:  .  hydroxypropyl methylcellulose / hypromellose (ISOPTO TEARS / GONIOVISC) 2.5 % ophthalmic solution, Place 1 drop into both eyes 4 (four) times daily as needed for dry eyes., Disp: , Rfl:  .  insulin aspart (NOVOLOG FLEXPEN) 100 UNIT/ML FlexPen, Inject 4 Units into the skin 3 (three) times daily with meals. 90 day supply. Generic ok. (Patient taking differently: Inject 7 Units into the skin 3 (three) times daily with meals. 90 day supply. Generic ok.), Disp: 15 mL, Rfl: 3 .  insulin  glargine (LANTUS) 100 UNIT/ML injection, Inject 10 Units into the skin daily., Disp: , Rfl:  .  lipase/protease/amylase (CREON) 36000 UNITS CPEP capsule, Take 36,000 Units by mouth., Disp: , Rfl:  .  Multiple Vitamins-Minerals (MULTIVITAMIN PO), Take 1 tablet by mouth daily., Disp: , Rfl:  .  omega-3 acid ethyl esters (LOVAZA) 1 g capsule, Take by mouth 2 (two) times daily., Disp: , Rfl:  .  omeprazole (PRILOSEC) 20 MG capsule, Take 1 capsule (20 mg total) by mouth daily., Disp: 90 capsule, Rfl: 3 .  ONETOUCH VERIO test strip, CHECK BLOOD SUGAR 3 TIMES A DAY (DX E11.9), Disp: 300 strip, Rfl: 3 .  oxybutynin (DITROPAN XL) 5 MG 24 hr tablet, Take 1 tablet (5 mg total) by mouth at bedtime., Disp: 30 tablet, Rfl: 11 .  primidone (MYSOLINE) 50 MG tablet, Take 1 tablet (50 mg total) by mouth 4 (four) times daily. (Patient taking differently: Take 50 mg by mouth at bedtime. ), Disp: 360 tablet, Rfl: 3 .  SODIUM CHLORIDE, GU IRRIGANT, IR, Irrigate with as directed. Irrigant every day flush drain once daily with 10 ML saline solution, Disp: , Rfl:  .  ursodiol (ACTIGALL) 300 MG capsule, Take 300 mg by mouth 2 (two) times daily., Disp: , Rfl:  .  vitamin B-12 (CYANOCOBALAMIN) 1000 MCG tablet, Take 1,000 mcg by mouth daily., Disp: , Rfl:  .  HYDROcodone-acetaminophen (NORCO/VICODIN) 5-325 MG tablet, Take 1-2 tablets by mouth every 4 (four) hours as needed for moderate pain. (Patient not taking: Reported on 11/23/2019), Disp: 100 tablet, Rfl: 0  Review of Systems  Constitutional: Negative for appetite change, chills and fever.  HENT: Negative.   Eyes: Negative.   Respiratory: Negative for chest tightness, shortness of breath and wheezing.   Cardiovascular: Negative for chest pain and palpitations.  Gastrointestinal: Negative.  Negative for abdominal pain, nausea and vomiting.  Endocrine: Negative.   Skin: Negative.   Allergic/Immunologic: Negative.   Neurological: Negative.   Hematological: Negative.     Psychiatric/Behavioral: Negative.     Social History   Tobacco Use  . Smoking status: Former Smoker    Packs/day: 0.50    Years: 30.00    Pack years: 15.00    Quit date: 10/06/1979    Years since quitting: 40.1  . Smokeless tobacco: Never Used  Substance Use Topics  . Alcohol use: No    Comment: Maybe a beer or 2 a month      Objective:   BP 116/68   Pulse (!) 53   Temp (!) 97.1 F (36.2 C) (Oral)   Resp 15   Wt 157 lb 3.2 oz (71.3 kg)   SpO2 97%   BMI 21.92 kg/m  Vitals:   11/23/19 1457  BP: 116/68  Pulse: (!) 53  Resp: 15  Temp: (!) 97.1 F (36.2 C)  TempSrc: Oral  SpO2: 97%  Weight: 157 lb 3.2 oz (71.3 kg)  Body mass  index is 21.92 kg/m.   Physical Exam Vitals reviewed.  Constitutional:      Comments: Cachectic white male in no acute distress.  HENT:     Head: Normocephalic and atraumatic.     Right Ear: External ear normal.     Left Ear: External ear normal.  Eyes:     General: No scleral icterus.    Conjunctiva/sclera: Conjunctivae normal.  Cardiovascular:     Rate and Rhythm: Normal rate and regular rhythm.     Pulses: Normal pulses.     Heart sounds: Normal heart sounds.     Comments: Dorsalis pedis and posterior tibial pulses are good. Pulmonary:     Effort: Pulmonary effort is normal.     Breath sounds: Normal breath sounds.  Abdominal:     Palpations: Abdomen is soft.     Tenderness: There is no abdominal tenderness.  Musculoskeletal:     Right lower leg: No edema.     Left lower leg: No edema.  Skin:    General: Skin is warm and dry.  Neurological:     Mental Status: He is alert and oriented to person, place, and time. Mental status is at baseline.  Psychiatric:        Mood and Affect: Mood normal.        Behavior: Behavior normal.        Thought Content: Thought content normal.        Judgment: Judgment normal.      No results found for any visits on 11/23/19.     Assessment & Plan    1. Abdominal pain, unspecified  abdominal location All symptoms resolved. - Comprehensive metabolic panel - CBC with Differential/Platelet - Lipase  2. Chronic pancreatitis, unspecified pancreatitis type [facility]) He has had multiple biliary drains in recent years after his Whipple procedure 13 years ago.  Is presently doing well.  Obtain lab work. - CBC with Differential/Platelet - Lipase  3. Malignant neoplasm of head of pancreas ([place]) Status post Whipple procedure 2007  4. Type 2 diabetes mellitus with other circulatory complication, with long-term current use of insulin (HCC) Exam is good today.  Normal.  Return to clinic 4 months - Hemoglobin A1c  5. CA of prostate (HCC)  - Comprehensive metabolic panel - Lipase  6. Hx of CABG There is no sign of claudication with his vascular disease.  His peripheral pulses and pedal pulses are good.     Richard Cranford Mon, MD  [facility]

## 2019-11-24 LAB — COMPREHENSIVE METABOLIC PANEL
ALT: 13 IU/L (ref 0–44)
AST: 18 IU/L (ref 0–40)
Albumin/Globulin Ratio: 1.8 (ref 1.2–2.2)
Albumin: 4.4 g/dL (ref 3.6–4.6)
Alkaline Phosphatase: 91 IU/L (ref 39–117)
BUN/Creatinine Ratio: 20 (ref 10–24)
BUN: 20 mg/dL (ref 8–27)
Bilirubin Total: 0.3 mg/dL (ref 0.0–1.2)
CO2: 22 mmol/L (ref 20–29)
Calcium: 9.7 mg/dL (ref 8.6–10.2)
Chloride: 101 mmol/L (ref 96–106)
Creatinine, Ser: 1.02 mg/dL (ref 0.76–1.27)
GFR calc Af Amer: 75 mL/min/{1.73_m2} (ref >59)
GFR calc non Af Amer: 65 mL/min/{1.73_m2} (ref >59)
Globulin, Total: 2.4 g/dL (ref 1.5–4.5)
Glucose: 92 mg/dL (ref 65–99)
Potassium: 4.7 mmol/L (ref 3.5–5.2)
Sodium: 138 mmol/L (ref 134–144)
Total Protein: 6.8 g/dL (ref 6.0–8.5)

## 2019-11-24 LAB — CBC WITH DIFFERENTIAL/PLATELET
Basophils Absolute: 0 10*3/uL (ref 0.0–0.2)
Basos: 1 %
EOS (ABSOLUTE): 0.1 10*3/uL (ref 0.0–0.4)
Eos: 2 %
Hematocrit: 41.8 % (ref 37.5–51.0)
Hemoglobin: 13.9 g/dL (ref 13.0–17.7)
Immature Grans (Abs): 0 10*3/uL (ref 0.0–0.1)
Immature Granulocytes: 0 %
Lymphocytes Absolute: 1.2 10*3/uL (ref 0.7–3.1)
Lymphs: 21 %
MCH: 26.7 pg (ref 26.6–33.0)
MCHC: 33.3 g/dL (ref 31.5–35.7)
MCV: 80 fL (ref 79–97)
Monocytes Absolute: 0.5 10*3/uL (ref 0.1–0.9)
Monocytes: 9 %
Neutrophils Absolute: 3.8 10*3/uL (ref 1.4–7.0)
Neutrophils: 67 %
Platelets: 196 10*3/uL (ref 150–450)
RBC: 5.2 x10E6/uL (ref 4.14–5.80)
RDW: 15.4 % (ref 11.6–15.4)
WBC: 5.7 10*3/uL (ref 3.4–10.8)

## 2019-11-24 LAB — HEMOGLOBIN A1C
Est. average glucose Bld gHb Est-mCnc: 171 mg/dL
Hgb A1c MFr Bld: 7.6 % — ABNORMAL HIGH (ref 4.8–5.6)

## 2019-11-24 LAB — LIPASE: Lipase: 7 U/L — ABNORMAL LOW (ref 13–78)

## 2020-02-13 ENCOUNTER — Ambulatory Visit: Payer: Self-pay | Admitting: Family Medicine

## 2020-03-13 ENCOUNTER — Other Ambulatory Visit: Payer: Self-pay

## 2020-03-13 ENCOUNTER — Ambulatory Visit (INDEPENDENT_AMBULATORY_CARE_PROVIDER_SITE_OTHER): Payer: Medicare HMO | Admitting: Family Medicine

## 2020-03-13 ENCOUNTER — Encounter: Payer: Self-pay | Admitting: Family Medicine

## 2020-03-13 VITALS — BP 113/69 | HR 65 | Temp 97.3°F | Resp 18 | Ht 71.0 in | Wt 157.0 lb

## 2020-03-13 DIAGNOSIS — E1159 Type 2 diabetes mellitus with other circulatory complications: Secondary | ICD-10-CM

## 2020-03-13 DIAGNOSIS — C250 Malignant neoplasm of head of pancreas: Secondary | ICD-10-CM

## 2020-03-13 DIAGNOSIS — E538 Deficiency of other specified B group vitamins: Secondary | ICD-10-CM

## 2020-03-13 DIAGNOSIS — Z794 Long term (current) use of insulin: Secondary | ICD-10-CM

## 2020-03-13 DIAGNOSIS — I25812 Atherosclerosis of bypass graft of coronary artery of transplanted heart without angina pectoris: Secondary | ICD-10-CM | POA: Diagnosis not present

## 2020-03-13 DIAGNOSIS — I10 Essential (primary) hypertension: Secondary | ICD-10-CM | POA: Diagnosis not present

## 2020-03-13 DIAGNOSIS — E7800 Pure hypercholesterolemia, unspecified: Secondary | ICD-10-CM

## 2020-03-13 LAB — POCT GLYCOSYLATED HEMOGLOBIN (HGB A1C): Hemoglobin A1C: 7.6 % — AB (ref 4.0–5.6)

## 2020-03-13 NOTE — Progress Notes (Signed)
Established patient visit  I,April Miller,acting as a scribe for Wilhemena Durie, MD.,have documented all relevant documentation on the behalf of Wilhemena Durie, MD,as directed by  Wilhemena Durie, MD while in the presence of Wilhemena Durie, MD.   Patient: Anthony Craig   DOB: [birth year]   86 y.o. Male  MRN: [id number] Visit Date: 03/13/2020  Today's healthcare provider: Wilhemena Durie, MD   Chief Complaint  Patient presents with   Follow-up   Diabetes   Subjective    HPI  Patient has been doing fairly well other than slowly having less energy.  Nothing acute.  No recurrent pancreatitis. Appetite is good and his sugars have been okay.  He has had both Covid vaccines.  Diabetes Mellitus Type II, follow-up  Lab Results  Component Value Date   HGBA1C 7.6 (A) 03/13/2020   HGBA1C 7.6 (H) 11/23/2019   HGBA1C 7.4 (H) 08/17/2019   Last seen for diabetes 4 months ago.  Management since then includes continuing the same treatment. He reports good compliance with treatment. He is not having side effects. none  Home blood sugar records: fasting range: patient has a dexcom  Episodes of hypoglycemia? No none   Current insulin regiment: Lantus and Novolog Most Recent Eye Exam: due  --------------------------------------------------------------------       Medications: Outpatient Medications Prior to Visit  Medication Sig   aspirin EC 81 MG tablet Take 81 mg by mouth daily.    b complex vitamins tablet Take 1 tablet by mouth daily.   calcium-vitamin D 250-100 MG-UNIT per tablet Take 1 tablet by mouth daily.    cholecalciferol (VITAMIN D) 1000 UNITS tablet Take 1,000 Units by mouth daily.   Coenzyme Q10 (COQ10) 200 MG CAPS Take 1 capsule by mouth daily.   Continuous Blood Gluc Sensor (DEXCOM G4 SENSOR) MISC by Does not apply route.   diphenoxylate-atropine (LOMOTIL) 2.5-0.025 MG per tablet Take 1 tablet by mouth 4 (four) times daily as  needed for diarrhea or loose stools.   fluocinonide (LIDEX) 0.05 % external solution Apply 1 application topically daily. To affected areas of scalp   HYDROcodone-acetaminophen (NORCO/VICODIN) 5-325 MG tablet Take 1-2 tablets by mouth every 4 (four) hours as needed for moderate pain.   hydrocortisone 2.5 % lotion Apply 1 application topically 2 (two) times daily. To affected areas of face only as needed for flares of itching/scaling   hydroxypropyl methylcellulose / hypromellose (ISOPTO TEARS / GONIOVISC) 2.5 % ophthalmic solution Place 1 drop into both eyes 4 (four) times daily as needed for dry eyes.   insulin aspart (NOVOLOG FLEXPEN) 100 UNIT/ML FlexPen Inject 4 Units into the skin 3 (three) times daily with meals. 90 day supply. Generic ok. (Patient taking differently: Inject 7 Units into the skin 3 (three) times daily with meals. 90 day supply. Generic ok.)   insulin glargine (LANTUS) 100 UNIT/ML injection Inject 10 Units into the skin daily.   lipase/protease/amylase (CREON) 36000 UNITS CPEP capsule Take 36,000 Units by mouth.   Multiple Vitamins-Minerals (MULTIVITAMIN PO) Take 1 tablet by mouth daily.   Multiple Vitamins-Minerals (PRESERVISION AREDS PO) Take by mouth.   omega-3 acid ethyl esters (LOVAZA) 1 g capsule Take by mouth 2 (two) times daily.   omeprazole (PRILOSEC) 20 MG capsule Take 1 capsule (20 mg total) by mouth daily.   primidone (MYSOLINE) 50 MG tablet Take 1 tablet (50 mg total) by mouth 4 (four) times daily. (Patient taking differently: Take 50 mg by mouth at  bedtime. )   SODIUM CHLORIDE, GU IRRIGANT, IR Irrigate with as directed. Irrigant every day flush drain once daily with 10 ML saline solution   ursodiol (ACTIGALL) 300 MG capsule Take 300 mg by mouth 2 (two) times daily.   vitamin B-12 (CYANOCOBALAMIN) 1000 MCG tablet Take 1,000 mcg by mouth daily.   ACCU-CHEK SMARTVIEW test strip CHECK SUGAR THREE TIMES DAILY (Patient not taking: Reported on 03/13/2020)    Alcohol Swabs (B-D SINGLE USE SWABS REGULAR) PADS USE AS DIRECTED  WITH  EACH  FINGERSTICK (Patient not taking: Reported on 03/13/2020)   atorvastatin (LIPITOR) 40 MG tablet TAKE 1 TABLET BY MOUTH EVERYDAY AT BEDTIME (Patient not taking: Reported on 03/13/2020)   diphenhydrAMINE (BENADRYL) 25 MG tablet Take 25 mg by mouth every 6 (six) hours as needed.   ONETOUCH VERIO test strip CHECK BLOOD SUGAR 3 TIMES A DAY (DX E11.9) (Patient not taking: Reported on 03/13/2020)   oxybutynin (DITROPAN XL) 5 MG 24 hr tablet Take 1 tablet (5 mg total) by mouth at bedtime. (Patient not taking: Reported on 03/13/2020)   No facility-administered medications prior to visit.    Review of Systems  Constitutional: Negative for appetite change, chills and fever.  HENT: Negative.   Eyes: Negative.   Respiratory: Negative for chest tightness, shortness of breath and wheezing.   Cardiovascular: Negative for chest pain and palpitations.  Gastrointestinal: Negative for abdominal pain, nausea and vomiting.  Endocrine: Negative.   Genitourinary: Negative.   Allergic/Immunologic: Negative.   Hematological: Negative.   Psychiatric/Behavioral: Negative.        Objective    BP 113/69 (BP Location: Right Arm, Patient Position: Sitting, Cuff Size: Large)    Pulse 65    Temp (!) 97.3 F (36.3 C) (Other (Comment))    Resp 18    Ht _0  (1.803 m)    Wt 157 lb (71.2 kg)    SpO2 95%    BMI 21.90 kg/m  BP Readings from Last 3 Encounters:  03/13/20 113/69  11/23/19 116/68  10/17/19 (!) 150/73   Wt Readings from Last 3 Encounters:  03/13/20 157 lb (71.2 kg)  11/23/19 157 lb 3.2 oz (71.3 kg)  10/17/19 150 lb (68 kg)      Physical Exam Vitals reviewed.  Constitutional:      Comments: Cachectic white male in no acute distress.  HENT:     Head: Normocephalic and atraumatic.     Right Ear: External ear normal.     Left Ear: External ear normal.  Eyes:     General: No scleral icterus.    Conjunctiva/sclera:  Conjunctivae normal.  Cardiovascular:     Rate and Rhythm: Normal rate and regular rhythm.     Heart sounds: Normal heart sounds.  Pulmonary:     Effort: Pulmonary effort is normal.     Breath sounds: Normal breath sounds.  Abdominal:     Palpations: Abdomen is soft.     Tenderness: There is no abdominal tenderness.  Musculoskeletal:     Right lower leg: No edema.     Left lower leg: No edema.  Skin:    General: Skin is warm and dry.  Neurological:     Mental Status: He is alert and oriented to person, place, and time. Mental status is at baseline.  Psychiatric:        Mood and Affect: Mood normal.        Behavior: Behavior normal.        Thought Content: Thought content  normal.        Judgment: Judgment normal.       Results for orders placed or performed in visit on 03/13/20  POCT HgB A1C  Result Value Ref Range   Hemoglobin A1C 7.6 (A) 4.0 - 5.6 %   SS MAC (OD)      Assessment & Plan     1. Type 2 diabetes mellitus with other circulatory complication, with long-term current use of insulin (HCC) A1c is 7.6.  No changes. - POCT HgB A1C  2. Essential hypertension Good control.  3. 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  4. Malignant neoplasm of head of pancreas ([place]) Status post Whipple procedure 12 years ago.  He has done well. He has had several drains placed in the pancreas for recurrent pancreatitis since that time but overall has had no recurrence of the cancer Patient has felt better since being on Actigall 5. B12 deficiency   6. Pure hypercholesterolemia    Return in 4 months (on 07/11/2020) for CPE.      I, Wilhemena Durie, MD, have reviewed all documentation for this visit. The documentation on 03/15/20 for the exam, diagnosis, procedures, and orders are all accurate and complete.    Delenn Ahn Cranford Mon, MD  [facility] [phone] (phone) [phone] (fax)  [place]

## 2020-03-26 ENCOUNTER — Ambulatory Visit: Payer: Self-pay | Admitting: Family Medicine

## 2020-04-22 ENCOUNTER — Other Ambulatory Visit: Payer: Self-pay | Admitting: Family Medicine

## 2020-04-22 DIAGNOSIS — K219 Gastro-esophageal reflux disease without esophagitis: Secondary | ICD-10-CM

## 2020-04-22 NOTE — Telephone Encounter (Signed)
Requested Prescriptions  Pending Prescriptions Disp Refills   omeprazole (PRILOSEC) 20 MG capsule [Pharmacy Med Name: OMEPRAZOLE DR 20 MG CAPSULE] 90 capsule 3    Sig: TAKE 1 CAPSULE BY MOUTH EVERY DAY     Gastroenterology: Proton Pump Inhibitors Passed - 04/22/2020  8:58 AM      Passed - Valid encounter within last 12 months    Recent Outpatient Visits          1 month ago Type 2 diabetes mellitus with other circulatory complication, with long-term current use of insulin [facility])   [facility] Jerrol Banana., MD   5 months ago Abdominal pain, unspecified abdominal location   [facility] Jerrol Banana., MD   8 months ago Frequency of urination   [facility] Jerrol Banana., MD   10 months ago Frequency of urination   [facility] Jerrol Banana., MD   1 year ago Chronic pancreatitis, unspecified pancreatitis type [facility])   [facility] Jerrol Banana., MD      Future Appointments            In 2 months Jerrol Banana., MD [facility] [place]

## 2020-04-24 DIAGNOSIS — H16223 Keratoconjunctivitis sicca, not specified as Sjogren's, bilateral: Secondary | ICD-10-CM | POA: Diagnosis not present

## 2020-04-24 DIAGNOSIS — E119 Type 2 diabetes mellitus without complications: Secondary | ICD-10-CM | POA: Diagnosis not present

## 2020-04-24 LAB — HM DIABETES EYE EXAM

## 2020-05-04 ENCOUNTER — Telehealth: Payer: Self-pay | Admitting: *Deleted

## 2020-05-04 NOTE — Chronic Care Management (AMB) (Signed)
  Chronic Care Management   Note  05/04/2020 Name: Anders Hohmann MRN: [id number] DOB: 1928-01-24  Nickolas Chalfin Dauria is a 86 y.o. year old male who is a primary care patient of Jerrol Banana., MD. I reached out to [name] by phone today in response to a referral sent by Mr. Loris Winrow Mcwherter's health plan.     Mr. Piet was given information about Chronic Care Management services today including:  1. CCM service includes personalized support from designated clinical staff supervised by his physician, including individualized plan of care and coordination with other care providers 2. 24/7 contact phone numbers for assistance for urgent and routine care needs. 3. Service will only be billed when office clinical staff spend 20 minutes or more in a month to coordinate care. 4. Only one practitioner may furnish and bill the service in a calendar month. 5. The patient may stop CCM services at any time (effective at the end of the month) by phone call to the office staff. 6. The patient will be responsible for cost sharing (co-pay) of up to 20% of the service fee (after annual deductible is met).  Patient agreed to services and verbal consent obtained.   Follow up plan: Telephone appointment with care management team member scheduled for: 05/25/2020  [address], [place], [place] [address] Direct Dial: [email or web address] Website: [email or web address]

## 2020-05-24 DIAGNOSIS — R69 Illness, unspecified: Secondary | ICD-10-CM | POA: Diagnosis not present

## 2020-05-25 ENCOUNTER — Telehealth: Payer: Medicare HMO

## 2020-06-12 NOTE — Progress Notes (Signed)
Subjective:   Anthony Craig is a 86 y.o. male who presents for Medicare Annual/Subsequent preventive examination.  I connected with Dennis Bast today by telephone and verified that I am speaking with the correct person using two identifiers. Location patient: home Location provider: work Persons participating in the virtual visit: patient, provider.   I discussed the limitations, risks, security and privacy concerns of performing an evaluation and management service by telephone and the availability of in person appointments. I also discussed with the patient that there may be a patient responsible charge related to this service. The patient expressed understanding and verbally consented to this telephonic visit.    Interactive audio and video telecommunications were attempted between this provider and patient, however failed, due to patient having technical difficulties OR patient did not have access to video capability.  We continued and completed visit with audio only.   Review of Systems    N/A  Cardiac Risk Factors include: advanced age (>59men, >8 women);dyslipidemia;male gender;hypertension;diabetes mellitus     Objective:    There were no vitals filed for this visit. There is no height or weight on file to calculate BMI.  Advanced Directives 06/13/2020 10/17/2019 06/07/2019 01/02/2019 06/03/2018 05/28/2017 09/18/2015  Does Patient Have a Medical Advance Directive? Yes No Yes Yes Yes Yes Yes  Type of Paramedic of [place];Living will - [place];Living will Out of facility DNR (pink MOST or yellow form) [place];Living will Living will;Healthcare Power of [place];Living will  Does patient want to make changes to medical advance directive? - - - - - - -  Copy of [place] in Chart? No - copy requested - No - copy requested - No - copy requested No - copy requested  -  Would patient like information on creating a medical advance directive? - - - No - Patient declined - - -    Current Medications (verified) Outpatient Encounter Medications as of 06/13/2020  Medication Sig  . aspirin EC 81 MG tablet Take 81 mg by mouth daily.   Marland Kitchen atorvastatin (LIPITOR) 40 MG tablet TAKE 1 TABLET BY MOUTH EVERYDAY AT BEDTIME  . b complex vitamins tablet Take 1 tablet by mouth daily.  . cholecalciferol (VITAMIN D) 1000 UNITS tablet Take 1,000 Units by mouth daily.  . Coenzyme Q10 (COQ10) 200 MG CAPS Take 100 mg by mouth daily.   . Continuous Blood Gluc Sensor (DEXCOM G4 SENSOR) MISC by Does not apply route.  . diphenhydrAMINE (BENADRYL) 25 MG tablet Take 25 mg by mouth every 6 (six) hours as needed.  . diphenoxylate-atropine (LOMOTIL) 2.5-0.025 MG per tablet Take 1 tablet by mouth 4 (four) times daily as needed for diarrhea or loose stools.  Marland Kitchen HYDROcodone-acetaminophen (NORCO/VICODIN) 5-325 MG tablet Take 1-2 tablets by mouth every 4 (four) hours as needed for moderate pain.  . hydroxypropyl methylcellulose / hypromellose (ISOPTO TEARS / GONIOVISC) 2.5 % ophthalmic solution Place 1 drop into both eyes 4 (four) times daily as needed for dry eyes.  . insulin aspart (NOVOLOG FLEXPEN) 100 UNIT/ML FlexPen Inject 4 Units into the skin 3 (three) times daily with meals. 90 day supply. Generic ok. (Patient taking differently: Inject 7 Units into the skin 3 (three) times daily with meals. 90 day supply. Generic ok.)  . insulin glargine (LANTUS) 100 UNIT/ML injection Inject 10 Units into the skin daily.  . lipase/protease/amylase (CREON) 36000 UNITS CPEP capsule Take 36,000 Units by mouth.  Marland Kitchen  Misc Natural Products (OSTEO BI-FLEX ADV JOINT SHIELD PO) Take by mouth 2 (two) times daily.  . Multiple Vitamins-Minerals (MULTIVITAMIN PO) Take 1 tablet by mouth daily.  Marland Kitchen omega-3 acid ethyl esters (LOVAZA) 1 g capsule Take by mouth 2 (two) times daily.  Marland Kitchen omeprazole (PRILOSEC) 20 MG capsule TAKE 1  CAPSULE BY MOUTH EVERY DAY  . primidone (MYSOLINE) 50 MG tablet Take 1 tablet (50 mg total) by mouth 4 (four) times daily. (Patient taking differently: Take 50 mg by mouth at bedtime. )  . ursodiol (ACTIGALL) 300 MG capsule Take 300 mg by mouth 2 (two) times daily.  . vitamin B-12 (CYANOCOBALAMIN) 1000 MCG tablet Take 1,000 mcg by mouth daily.  Marland Kitchen ACCU-CHEK SMARTVIEW test strip CHECK SUGAR THREE TIMES DAILY (Patient not taking: Reported on 03/13/2020)  . Alcohol Swabs (B-D SINGLE USE SWABS REGULAR) PADS USE AS DIRECTED  WITH  EACH  FINGERSTICK (Patient not taking: Reported on 03/13/2020)  . calcium-vitamin D 250-100 MG-UNIT per tablet Take 1 tablet by mouth daily.  (Patient not taking: Reported on 06/13/2020)  . fluocinonide (LIDEX) 0.05 % external solution Apply 1 application topically daily. To affected areas of scalp (Patient not taking: Reported on 06/13/2020)  . hydrocortisone 2.5 % lotion Apply 1 application topically 2 (two) times daily. To affected areas of face only as needed for flares of itching/scaling (Patient not taking: Reported on 06/13/2020)  . Multiple Vitamins-Minerals (PRESERVISION AREDS PO) Take by mouth. (Patient not taking: Reported on 06/13/2020)  . ONETOUCH VERIO test strip CHECK BLOOD SUGAR 3 TIMES A DAY (DX E11.9) (Patient not taking: Reported on 03/13/2020)  . oxybutynin (DITROPAN XL) 5 MG 24 hr tablet Take 1 tablet (5 mg total) by mouth at bedtime. (Patient not taking: Reported on 03/13/2020)  . SODIUM CHLORIDE, GU IRRIGANT, IR Irrigate with as directed. Irrigant every day flush drain once daily with 10 ML saline solution (Patient not taking: Reported on 06/13/2020)   No facility-administered encounter medications on file as of 06/13/2020.    Allergies (verified) Doxycycline and Hydrocodone-homatropine   History: Past Medical History:  Diagnosis Date  . Cancer [facility])    pancreatic  . Coronary artery disease   . Diabetes mellitus without complication ([place])    type 2  .  Hyperlipidemia   . Hypertension    Past Surgical History:  Procedure Laterality Date  . APPENDECTOMY     1977  . BILIARY DRAINAGE    . CORONARY ARTERY BYPASS GRAFT     2001-3 vessel  . HERNIA REPAIR Bilateral    2016-left, 2009-right  . WHIPPLE PROCEDURE  2007   Family History  Problem Relation Age of Onset  . Heart disease Mother   . Cancer Mother 22       breast  . Heart disease Father   . COPD Brother   . Stroke Brother   . Diabetes Other        1 and 2   Social History   Socioeconomic History  . Marital status: Married    Spouse name: Not on file  . Number of children: 0  . Years of education: Not on file  . Highest education level: Some college, no degree  Occupational History  . Occupation: retired  . Occupation: part time job @ [employer]  Tobacco Use  . Smoking status: Former Smoker    Packs/day: 0.50    Years: 30.00    Pack years: 15.00    Quit date: 10/06/1979    Years since quitting:  40.7  . Smokeless tobacco: Never Used  Vaping Use  . Vaping Use: Never used  Substance and Sexual Activity  . Alcohol use: Yes    Comment: Maybe a beer or 2 a month  . Drug use: No  . Sexual activity: Not on file  Other Topics Concern  . Not on file  Social History Narrative  . Not on file   Social Determinants of Health   Financial Resource Strain: Low Risk   . Difficulty of Paying Living Expenses: Not hard at all  Food Insecurity: No Food Insecurity  . Worried About Charity fundraiser in the Last Year: Never true  . Ran Out of Food in the Last Year: Never true  Transportation Needs: No Transportation Needs  . Lack of Transportation (Medical): No  . Lack of Transportation (Non-Medical): No  Physical Activity: Sufficiently Active  . Days of Exercise per Week: 3 days  . Minutes of Exercise per Session: 60 min  Stress: No Stress Concern Present  . Feeling of Stress : Not at all  Social Connections: Moderately Integrated  . Frequency of Communication with  Friends and Family: More than three times a week  . Frequency of Social Gatherings with Friends and Family: More than three times a week  . Attends Religious Services: More than 4 times per year  . Active Member of Clubs or Organizations: No  . Attends Archivist Meetings: Never  . Marital Status: Married    Tobacco Counseling Counseling given: Not Answered   Clinical Intake:  Pre-visit preparation completed: Yes  Pain : No/denies pain     Nutritional Risks: None Diabetes: Yes  How often do you need to have someone help you when you read instructions, pamphlets, or other written materials from your doctor or pharmacy?: 1 - Never  Diabetic? Yes  Nutrition Risk Assessment:  Has the patient had any N/V/D within the last 2 months?  No  Does the patient have any non-healing wounds?  No  Has the patient had any unintentional weight loss or weight gain?  No   Diabetes:  Is the patient diabetic?  Yes  If diabetic, was a CBG obtained today?  No  Did the patient bring in their glucometer from home?  No  How often do you monitor your CBG's? Has a Dexacom meter that checks BS continuously.   Financial Strains and Diabetes Management:  Are you having any financial strains with the device, your supplies or your medication? No .  Does the patient want to be seen by Chronic Care Management for management of their diabetes?  No  Would the patient like to be referred to a Nutritionist or for Diabetic Management?  No   Diabetic Exams:  Diabetic Eye Exam: Completed 04/24/20 Diabetic Foot Exam: Completed 3 weeks ago at [place]. Requested a copy to update chart.   Interpreter Needed?: No  Information entered by :: [facility], LPN   Activities of Daily Living In your present state of health, do you have any difficulty performing the following activities: 06/13/2020  Hearing? Y  Comment Wears bilateral hearing aids.  Vision? N  Difficulty concentrating or making decisions? N    Walking or climbing stairs? N  Dressing or bathing? N  Doing errands, shopping? N  Preparing Food and eating ? N  Using the Toilet? N  In the past six months, have you accidently leaked urine? Y  Comment Occasionally with straining.  Do you have problems with loss of bowel control?  N  Managing your Medications? N  Managing your Finances? Y  Comment Wife assists with writing checks.  Housekeeping or managing your Housekeeping? N  Some recent data might be hidden    Patient Care Team: Jerrol Banana., MD as PCP - General (Family Medicine) Benito Mccreedy, MD as Referring Physician (Family Medicine) Verdia Kuba, [facility] (Pharmacist) Estill Cotta, MD (Ophthalmology) Gabriel Carina Betsey Holiday, MD as Physician Assistant (Endocrinology)  Indicate any recent Medical Services you may have received from other than [facility] providers in the past year (date may be approximate).     Assessment:   This is a routine wellness examination for Adeel.  Hearing/Vision screen No exam data present  Dietary issues and exercise activities discussed: Current Exercise Habits: Structured exercise class, Type of exercise: strength training/weights, Time (Minutes): > 60, Frequency (Times/Week): 3, Weekly Exercise (Minutes/Week): 0, Intensity: Mild, Exercise limited by: None identified  Goals    . water intake     Recommend increasing water intake to 4-6 glasses of water a day.      Depression Screen PHQ 2/9 Scores 06/13/2020 06/07/2019 02/16/2019 06/03/2018 06/03/2018 05/28/2017 05/29/2016  PHQ - 2 Score 0 0 0 1 1 0 0  PHQ- 9 Score - - - - 2 - -    Fall Risk Fall Risk  06/13/2020 06/07/2019 06/03/2018 05/28/2017 05/29/2016  Falls in the past year? 0 0 No No Yes  Number falls in past yr: 0 - - - 1  Injury with Fall? 0 - - - No    Any stairs in or around the home? Yes  If so, are there any without handrails? No  Home free of loose throw rugs in walkways, pet beds, electrical cords, etc? Yes  Adequate lighting  in your home to reduce risk of falls? Yes   ASSISTIVE DEVICES UTILIZED TO PREVENT FALLS:  Life alert? No  Use of a cane, walker or w/c? No  Grab bars in the bathroom? Yes  Shower chair or bench in shower? Yes  Elevated toilet seat or a handicapped toilet? No    Cognitive Function: Declined today.      6CIT Screen 06/07/2019 05/28/2017  What Year? 0 points 0 points  What month? 0 points 0 points  What time? 0 points 0 points  Count back from 20 0 points 0 points  Months in reverse 0 points 0 points  Repeat phrase 2 points 0 points  Total Score 2 0    Immunizations Immunization History  Administered Date(s) Administered  . Influenza, High Dose Seasonal PF 06/14/2019  . Influenza,inj,Quad PF,6-35 Mos 07/01/2017  . Influenza-Unspecified 07/09/2015  . PFIZER SARS-COV-2 Vaccination 10/20/2019, 11/08/2019  . Pneumococcal Conjugate-13 11/14/2013  . Pneumococcal Polysaccharide-23 06/16/2007, 06/08/2010  . Td 06/08/2006  . Tdap 07/01/2017    TDAP status: Up to date Flu Vaccine status: Due fall 2021 Pneumococcal vaccine status: Up to date Covid-19 vaccine status: Completed vaccines  Qualifies for Shingles Vaccine? Yes   Zostavax completed No   Shingrix Completed?: No.    Education has been provided regarding the importance of this vaccine. Patient has been advised to call insurance company to determine out of pocket expense if they have not yet received this vaccine. Advised may also receive vaccine at local pharmacy or Health Dept. Verbalized acceptance and understanding.  Screening Tests Health Maintenance  Topic Date Due  . FOOT EXAM  02/03/2018  . URINE MICROALBUMIN  09/07/2019  . INFLUENZA VACCINE  05/06/2020  . HEMOGLOBIN A1C  09/12/2020  .  OPHTHALMOLOGY EXAM  04/24/2021  . TETANUS/TDAP  07/02/2027  . COVID-19 Vaccine  Completed  . PNA vac Low Risk Adult  Completed    Health Maintenance  Health Maintenance Due  Topic Date Due  . FOOT EXAM  02/03/2018  . URINE  MICROALBUMIN  09/07/2019  . INFLUENZA VACCINE  05/06/2020    Colorectal cancer screening: No longer required.   Lung Cancer Screening: (Low Dose CT Chest recommended if Age 42-80 years, 30 pack-year currently smoking OR have quit w/in 15years.) does not qualify.   Additional Screening:  Vision Screening: Recommended annual ophthalmology exams for early detection of glaucoma and other disorders of the eye. Is the patient up to date with their annual eye exam?  Yes  Who is the provider or what is the name of the office in which the patient attends annual eye exams? Dr Dingeldein @ [place] If pt is not established with a provider, would they like to be referred to a provider to establish care? No .   Dental Screening: Recommended annual dental exams for proper oral hygiene  Community Resource Referral / Chronic Care Management: CRR required this visit?  No   CCM required this visit?  No      Plan:     I have personally reviewed and noted the following in the patient's chart:   . Medical and social history . Use of alcohol, tobacco or illicit drugs  . Current medications and supplements . Functional ability and status . Nutritional status . Physical activity . Advanced directives . List of other physicians . Hospitalizations, surgeries, and ER visits in previous 12 months . Vitals . Screenings to include cognitive, depression, and falls . Referrals and appointments  In addition, I have reviewed and discussed with patient certain preventive protocols, quality metrics, and best practice recommendations. A written personalized care plan for preventive services as well as general preventive health recommendations were provided to patient.     Abdulhadi Stopa [place], [place]   12/08/2874   Nurse Notes: Pt needs a flu vaccine and urine check at next in office apt. Requested previous foot exam notes to update chart.

## 2020-06-13 ENCOUNTER — Ambulatory Visit (INDEPENDENT_AMBULATORY_CARE_PROVIDER_SITE_OTHER): Payer: Medicare HMO

## 2020-06-13 ENCOUNTER — Other Ambulatory Visit: Payer: Self-pay

## 2020-06-13 DIAGNOSIS — Z0000 Encounter for general adult medical examination without abnormal findings: Secondary | ICD-10-CM

## 2020-06-13 NOTE — Patient Instructions (Addendum)
Anthony Craig , Thank you for taking time to come for your Medicare Wellness Visit. I appreciate your ongoing commitment to your health goals. Please review the following plan we discussed and let me know if I can assist you in the future.   Screening recommendations/referrals: Colonoscopy: No longer required.  Recommended yearly ophthalmology/optometry visit for glaucoma screening and checkup Recommended yearly dental visit for hygiene and checkup  Vaccinations: Influenza vaccine: Due fall 2021 Pneumococcal vaccine: Completed series Tdap vaccine: Up to date, due 06/2027 Shingles vaccine: Shingrix discussed. Please contact your pharmacy for coverage information.     Advanced directives: Please bring a copy of your POA (Power of Attorney) and/or Living Will to your next appointment.   Conditions/risks identified: Recommend to increasing water intake to 6-8 8 oz glasses a day.   Next appointment: 06/18/20 for a CCM call & 07/11/20 @ 9:20 AM with Dr Rosanna Randy,  Preventive Care 24 Years and Older, Male Preventive care refers to lifestyle choices and visits with your health care provider that can promote health and wellness. What does preventive care include?  A yearly physical exam. This is also called an annual well check.  Dental exams once or twice a year.  Routine eye exams. Ask your health care provider how often you should have your eyes checked.  Personal lifestyle choices, including:  Daily care of your teeth and gums.  Regular physical activity.  Eating a healthy diet.  Avoiding tobacco and drug use.  Limiting alcohol use.  Practicing safe sex.  Taking low doses of aspirin every day.  Taking vitamin and mineral supplements as recommended by your health care provider. What happens during an annual well check? The services and screenings done by your health care provider during your annual well check will depend on your age, overall health, lifestyle risk factors, and  family history of disease. Counseling  Your health care provider may ask you questions about your:  Alcohol use.  Tobacco use.  Drug use.  Emotional well-being.  Home and relationship well-being.  Sexual activity.  Eating habits.  History of falls.  Memory and ability to understand (cognition).  Work and work Statistician. Screening  You may have the following tests or measurements:  Height, weight, and BMI.  Blood pressure.  Lipid and cholesterol levels. These may be checked every 5 years, or more frequently if you are over 79 years old.  Skin check.  Lung cancer screening. You may have this screening every year starting at age 41 if you have a 30-pack-year history of smoking and currently smoke or have quit within the past 15 years.  Fecal occult blood test (FOBT) of the stool. You may have this test every year starting at age 52.  Flexible sigmoidoscopy or colonoscopy. You may have a sigmoidoscopy every 5 years or a colonoscopy every 10 years starting at age 39.  Prostate cancer screening. Recommendations will vary depending on your family history and other risks.  Hepatitis C blood test.  Hepatitis B blood test.  Sexually transmitted disease (STD) testing.  Diabetes screening. This is done by checking your blood sugar (glucose) after you have not eaten for a while (fasting). You may have this done every 1-3 years.  Abdominal aortic aneurysm (AAA) screening. You may need this if you are a current or former smoker.  Osteoporosis. You may be screened starting at age 57 if you are at high risk. Talk with your health care provider about your test results, treatment options, and if necessary, the  need for more tests. Vaccines  Your health care provider may recommend certain vaccines, such as:  Influenza vaccine. This is recommended every year.  Tetanus, diphtheria, and acellular pertussis (Tdap, Td) vaccine. You may need a Td booster every 10 years.  Zoster  vaccine. You may need this after age 19.  Pneumococcal 13-valent conjugate (PCV13) vaccine. One dose is recommended after age 75.  Pneumococcal polysaccharide (PPSV23) vaccine. One dose is recommended after age 53. Talk to your health care provider about which screenings and vaccines you need and how often you need them. This information is not intended to replace advice given to you by your health care provider. Make sure you discuss any questions you have with your health care provider. Document Released: 10/19/2015 Document Revised: 06/11/2016 Document Reviewed: 07/24/2015 Elsevier Interactive Patient Education  2017 [place] Prevention in the Home Falls can cause injuries. They can happen to people of all ages. There are many things you can do to make your home safe and to help prevent falls. What can I do on the outside of my home?  Regularly fix the edges of walkways and driveways and fix any cracks.  Remove anything that might make you trip as you walk through a door, such as a raised step or threshold.  Trim any bushes or trees on the path to your home.  Use bright outdoor lighting.  Clear any walking paths of anything that might make someone trip, such as rocks or tools.  Regularly check to see if handrails are loose or broken. Make sure that both sides of any steps have handrails.  Any raised decks and porches should have guardrails on the edges.  Have any leaves, snow, or ice cleared regularly.  Use sand or salt on walking paths during winter.  Clean up any spills in your garage right away. This includes oil or grease spills. What can I do in the bathroom?  Use night lights.  Install grab bars by the toilet and in the tub and shower. Do not use towel bars as grab bars.  Use non-skid mats or decals in the tub or shower.  If you need to sit down in the shower, use a plastic, non-slip stool.  Keep the floor dry. Clean up any water that spills on the  floor as soon as it happens.  Remove soap buildup in the tub or shower regularly.  Attach bath mats securely with double-sided non-slip rug tape.  Do not have throw rugs and other things on the floor that can make you trip. What can I do in the bedroom?  Use night lights.  Make sure that you have a light by your bed that is easy to reach.  Do not use any sheets or blankets that are too big for your bed. They should not hang down onto the floor.  Have a firm chair that has side arms. You can use this for support while you get dressed.  Do not have throw rugs and other things on the floor that can make you trip. What can I do in the kitchen?  Clean up any spills right away.  Avoid walking on wet floors.  Keep items that you use a lot in easy-to-reach places.  If you need to reach something above you, use a strong step stool that has a grab bar.  Keep electrical cords out of the way.  Do not use floor polish or wax that makes floors slippery. If you must use wax,  use non-skid floor wax.  Do not have throw rugs and other things on the floor that can make you trip. What can I do with my stairs?  Do not leave any items on the stairs.  Make sure that there are handrails on both sides of the stairs and use them. Fix handrails that are broken or loose. Make sure that handrails are as long as the stairways.  Check any carpeting to make sure that it is firmly attached to the stairs. Fix any carpet that is loose or worn.  Avoid having throw rugs at the top or bottom of the stairs. If you do have throw rugs, attach them to the floor with carpet tape.  Make sure that you have a light switch at the top of the stairs and the bottom of the stairs. If you do not have them, ask someone to add them for you. What else can I do to help prevent falls?  Wear shoes that:  Do not have high heels.  Have rubber bottoms.  Are comfortable and fit you well.  Are closed at the toe. Do not wear  sandals.  If you use a stepladder:  Make sure that it is fully opened. Do not climb a closed stepladder.  Make sure that both sides of the stepladder are locked into place.  Ask someone to hold it for you, if possible.  Clearly mark and make sure that you can see:  Any grab bars or handrails.  First and last steps.  Where the edge of each step is.  Use tools that help you move around (mobility aids) if they are needed. These include:  Canes.  Walkers.  Scooters.  Crutches.  Turn on the lights when you go into a dark area. Replace any light bulbs as soon as they burn out.  Set up your furniture so you have a clear path. Avoid moving your furniture around.  If any of your floors are uneven, fix them.  If there are any pets around you, be aware of where they are.  Review your medicines with your doctor. Some medicines can make you feel dizzy. This can increase your chance of falling. Ask your doctor what other things that you can do to help prevent falls. This information is not intended to replace advice given to you by your health care provider. Make sure you discuss any questions you have with your health care provider. Document Released: 07/19/2009 Document Revised: 02/28/2016 Document Reviewed: 10/27/2014 Elsevier Interactive Patient Education  2017 [employer].

## 2020-06-15 ENCOUNTER — Telehealth: Payer: Self-pay

## 2020-06-15 NOTE — Progress Notes (Signed)
..  Have you seen any other providers since your last visit? **no Any changes in your medications or health? no Any side effects from any medications? no Do you have an symptoms or problems not managed by your medications? Yes patient states he has arthritis in L3 and L5 that osteobioflex provides some relief from. The pain does not both him when he is sleeping or when he is sitting but does bother him whenever he walks. Any concerns about your health right now? no Has your provider asked that you check blood pressure, blood sugar, or follow special diet at home? yes Do you get any type of exercise on a regular basis? Yes goes to gym for 2 hours a couple days a week.  Can you think of a goal you would like to reach for your health? Would like to be able to return to playing golf again Do you have any problems getting your medications? No Patient utilizes [facility] for prescriptions when cost is too high. Please bring medications and supplements to appointment

## 2020-06-18 ENCOUNTER — Ambulatory Visit: Payer: Medicare HMO | Admitting: Pharmacist

## 2020-06-18 ENCOUNTER — Other Ambulatory Visit: Payer: Self-pay

## 2020-06-18 DIAGNOSIS — E7800 Pure hypercholesterolemia, unspecified: Secondary | ICD-10-CM

## 2020-06-18 DIAGNOSIS — E119 Type 2 diabetes mellitus without complications: Secondary | ICD-10-CM

## 2020-06-18 NOTE — Chronic Care Management (AMB) (Signed)
Chronic Care Management Pharmacy  Name: Anthony Craig  MRN: [id number] DOB: [birth year]  Chief Complaint/ HPI  Anthony Craig [place],  86 y.o. , male presents for their Initial CCM visit with the clinical pharmacist via telephone due to COVID-19 Pandemic.  PCP : Jerrol Banana., MD  Their chronic conditions include: HTN, HLD, DM  Office Visits: 6/8 DM, [place], BP 113/69 P 65 157 BMI 21.9  Consult Visit: NA  Medications: Outpatient Encounter Medications as of 06/18/2020  Medication Sig Note  . ACCU-CHEK SMARTVIEW test strip CHECK SUGAR THREE TIMES DAILY (Patient not taking: Reported on 03/13/2020)   . Alcohol Swabs (B-D SINGLE USE SWABS REGULAR) PADS USE AS DIRECTED  WITH  EACH  FINGERSTICK (Patient not taking: Reported on 03/13/2020)   . aspirin EC 81 MG tablet Take 81 mg by mouth daily.    Marland Kitchen atorvastatin (LIPITOR) 40 MG tablet TAKE 1 TABLET BY MOUTH EVERYDAY AT BEDTIME 02/16/2019: Medication stopped by hospital physician  . b complex vitamins tablet Take 1 tablet by mouth daily.   . calcium-vitamin D 250-100 MG-UNIT per tablet Take 1 tablet by mouth daily.  (Patient not taking: Reported on 06/13/2020)   . cholecalciferol (VITAMIN D) 1000 UNITS tablet Take 1,000 Units by mouth daily.   . Coenzyme Q10 (COQ10) 200 MG CAPS Take 100 mg by mouth daily.    . Continuous Blood Gluc Sensor (DEXCOM G4 SENSOR) MISC by Does not apply route.   . diphenhydrAMINE (BENADRYL) 25 MG tablet Take 25 mg by mouth every 6 (six) hours as needed. 11/10/2016: prn  . diphenoxylate-atropine (LOMOTIL) 2.5-0.025 MG per tablet Take 1 tablet by mouth 4 (four) times daily as needed for diarrhea or loose stools.   . fluocinonide (LIDEX) 0.05 % external solution Apply 1 application topically daily. To affected areas of scalp (Patient not taking: Reported on 06/13/2020)   . HYDROcodone-acetaminophen (NORCO/VICODIN) 5-325 MG tablet Take 1-2 tablets by mouth every 4 (four) hours as needed for moderate pain.   .  hydrocortisone 2.5 % lotion Apply 1 application topically 2 (two) times daily. To affected areas of face only as needed for flares of itching/scaling (Patient not taking: Reported on 06/13/2020)   . hydroxypropyl methylcellulose / hypromellose (ISOPTO TEARS / GONIOVISC) 2.5 % ophthalmic solution Place 1 drop into both eyes 4 (four) times daily as needed for dry eyes.   . insulin aspart (NOVOLOG FLEXPEN) 100 UNIT/ML FlexPen Inject 4 Units into the skin 3 (three) times daily with meals. 90 day supply. Generic ok. (Patient taking differently: Inject 7 Units into the skin 3 (three) times daily with meals. 90 day supply. Generic ok.) 06/13/2020: 5 units at breakfast, 3 units at lunch and 5 units in PM  . insulin glargine (LANTUS) 100 UNIT/ML injection Inject 10 Units into the skin daily. 06/13/2020: At bedtime  . lipase/protease/amylase (CREON) 36000 UNITS CPEP capsule Take 36,000 Units by mouth.   . Misc Natural Products (OSTEO BI-FLEX ADV JOINT SHIELD PO) Take by mouth 2 (two) times daily.   . Multiple Vitamins-Minerals (MULTIVITAMIN PO) Take 1 tablet by mouth daily.   . Multiple Vitamins-Minerals (PRESERVISION AREDS PO) Take by mouth. (Patient not taking: Reported on 06/13/2020)   . omega-3 acid ethyl esters (LOVAZA) 1 g capsule Take by mouth 2 (two) times daily.   Marland Kitchen omeprazole (PRILOSEC) 20 MG capsule TAKE 1 CAPSULE BY MOUTH EVERY DAY   . ONETOUCH VERIO test strip CHECK BLOOD SUGAR 3 TIMES A DAY (DX E11.9) (Patient not taking: Reported  on 03/13/2020)   . oxybutynin (DITROPAN XL) 5 MG 24 hr tablet Take 1 tablet (5 mg total) by mouth at bedtime. (Patient not taking: Reported on 03/13/2020)   . primidone (MYSOLINE) 50 MG tablet Take 1 tablet (50 mg total) by mouth 4 (four) times daily. (Patient taking differently: Take 50 mg by mouth at bedtime. )   . SODIUM CHLORIDE, GU IRRIGANT, IR Irrigate with as directed. Irrigant every day flush drain once daily with 10 ML saline solution (Patient not taking: Reported on  06/13/2020)   . ursodiol (ACTIGALL) 300 MG capsule Take 300 mg by mouth 2 (two) times daily.   . vitamin B-12 (CYANOCOBALAMIN) 1000 MCG tablet Take 1,000 mcg by mouth daily.    No facility-administered encounter medications on file as of 06/18/2020.      Financial Resource Strain: Low Risk   . Difficulty of Paying Living Expenses: Not hard at all    Current Diagnosis/Assessment:  Goals Addressed   None    Diabetes   Recent Relevant Labs: Lab Results  Component Value Date/Time   HGBA1C 7.6 (A) 03/13/2020 02:11 PM   HGBA1C 7.6 (H) 11/23/2019 03:52 PM   HGBA1C 7.4 (H) 08/17/2019 08:15 AM   HGBA1C 7.4 03/09/2017 12:00 AM   HGBA1C 9.2 11/08/2015 12:00 AM     Patient has failed these meds in past: NA Patient is currently controlled on the following medications: None  Last diabetic Foot exam:  Lab Results  Component Value Date/Time   HMDIABEYEEXA No Retinopathy 04/24/2020 12:00 AM    Last diabetic Eye exam: No results found for: HMDIABFOOTEX   We discussed:  Hypoglycemia once in 6 months At goal A1c < 8% age 86 s/p pancreatic cancer  Plan  Continue control with diet and exercise   Hypertension   BP goal is:  <140/90  Office blood pressures are  BP Readings from Last 3 Encounters:  03/13/20 113/69  11/23/19 116/68  10/17/19 (!) 150/73   Patient checks BP at home infrequently Patient home BP readings are ranging: NA  Patient has failed these meds in the past: NA Patient is currently controlled on the following medications:  . CoQ10 166m daily  We discussed: Relaxed goals for age [age]Denies orthostasis or hypotension  Plan  Continue control with diet and exercise     Hyperlipidemia   LDL goal < 100  Lipid Panel     Component Value Date/Time   CHOL 148 08/17/2019 0815   CHOL 66 01/17/2014 0413   TRIG 88 08/17/2019 0815   TRIG 50 01/17/2014 0413   HDL 43 08/17/2019 0815   HDL 36 (L) 01/17/2014 0413   LDLCALC 88 08/17/2019 0815   LDLCALC 20  01/17/2014 0413    Hepatic Function Latest Ref Rng & Units 11/23/2019 10/17/2019 08/17/2019  Total Protein 6.0 - 8.5 g/dL 6.8 7.1 6.7  Albumin 3.6 - 4.6 g/dL 4.4 4.2 4.2  AST 0 - 40 IU/L 18 95(H) 20  ALT 0 - 44 IU/L 13 42 19  Alk Phosphatase 39 - 117 IU/L 91 93 95  Total Bilirubin 0.0 - 1.2 mg/dL 0.3 0.9 0.4  Bilirubin, Direct 0.00 - 0.40 mg/dL - - -     The ASCVD Risk score (Mikey BussingDC Jr., et al., 2013) failed to calculate for the following reasons:   The 2013 ASCVD risk score is only valid for ages 422to 754  The patient has a prior MI or stroke diagnosis   Patient has failed these meds in  past: NA Patient is currently controlled on the following medications:  . Lipitor 67m daily  We discussed:  At modified goal age [age]Denies myalgias  Plan  Continue current medications  Medication Management   [facility] U[facility]? Yes Pt endorses 100% compliance  We discussed:  Where are you getting your meds? Mail order No problem with GERD Focus factor once daily - counseled expensive fish oil Zinc 548mdaily - counseled no indication Bone density exam Wife about 1336 1443ears younger Recommended Viagra after MD consult Wants refill on scalp medication  Plan  Continue current medication management strategy  Follow up: 3 month phone visit  TeMilus HeightPharmD, [phone]

## 2020-06-20 NOTE — Patient Instructions (Signed)
Visit Information  Goals Addressed            This Visit's Progress   . Chronic Care Management       CARE PLAN ENTRY (see longitudinal plan of care for additional care plan information)  Current Barriers:  . Chronic Disease Management support, education, and care coordination needs related to Hypertension, Hyperlipidemia, and Diabetes   Hypertension BP Readings from Last 3 Encounters:  03/13/20 113/69  11/23/19 116/68  10/17/19 (!) 150/73   . Pharmacist Clinical Goal(s): o Over the next 90 days, patient will work with PharmD and providers to maintain BP goal <140/90 . Current regimen:  o Coenzyme Q10 100mg  daily . Interventions: o None . Patient self care activities - Over the next 90 days, patient will: o Check BP weekly, document, and provide at future appointments o Ensure daily salt intake < 2300 mg/day  Hyperlipidemia Lab Results  Component Value Date/Time   LDLCALC 88 08/17/2019 08:15 AM   LDLCALC 20 01/17/2014 04:13 AM   . Pharmacist Clinical Goal(s): o Over the next 90 days, patient will work with PharmD and providers to maintain LDL goal < 100 (age modified) . Current regimen:  o Atorvastatin 40mg  daily . Interventions: o None . Patient self care activities - Over the next 90 days, patient will: o Continue current healthy lifestyle practices  Diabetes Lab Results  Component Value Date/Time   HGBA1C 7.6 (A) 03/13/2020 02:11 PM   HGBA1C 7.6 (H) 11/23/2019 03:52 PM   HGBA1C 7.4 (H) 08/17/2019 08:15 AM   HGBA1C 7.4 03/09/2017 12:00 AM   HGBA1C 9.2 11/08/2015 12:00 AM   . Pharmacist Clinical Goal(s): o Over the next 90 days, patient will work with PharmD and providers to maintain A1c goal <8% . Current regimen:  o None . Interventions: o None . Patient self care activities - Over the next 90 days, patient will: o Check blood sugar with Dexcom, document, and provide at future appointments o Contact provider with any episodes of  hypoglycemia  Erectile Dysfunction . Pharmacist Clinical Goal(s) o Over the next 90 days, patient will work with PharmD and providers to restart sexual activity with your wife . Current regimen:  o None . Interventions: o Recommended Viagra 50mg  1 tab as needed before sexual activity . Patient self care activities - Over the next 90 days, patient will: o Check with MD to see if physically able to resume sexual activity  Medication management . Pharmacist Clinical Goal(s): o Over the next 90 days, patient will work with PharmD and providers to maintain optimal medication adherence . Current pharmacy: [facility] . Interventions o Comprehensive medication review performed. o Continue current medication management strategy . Patient self care activities - Over the next 90 days, patient will: o Focus on medication adherence by reducing the number of supplements as discussed during the visit o Take medications as prescribed o Report any questions or concerns to PharmD and/or provider(s)  Initial goal documentation        Mr. Bozzo was given information about Chronic Care Management services today including:  1. CCM service includes personalized support from designated clinical staff supervised by his physician, including individualized plan of care and coordination with other care providers 2. 24/7 contact phone numbers for assistance for urgent and routine care needs. 3. Standard insurance, coinsurance, copays and deductibles apply for chronic care management only during months in which we provide at least 20 minutes of these services. Most insurances cover these services at 100%, however patients  may be responsible for any copay, coinsurance and/or deductible if applicable. This service may help you avoid the need for more expensive face-to-face services. 4. Only one practitioner may furnish and bill the service in a calendar month. 5. The patient may stop CCM services at any time  (effective at the end of the month) by phone call to the office staff.  Patient agreed to services and verbal consent obtained.   Print copy of patient instructions provided.  Telephone follow up appointment with pharmacy team member scheduled for: 3 months

## 2020-07-11 ENCOUNTER — Ambulatory Visit (INDEPENDENT_AMBULATORY_CARE_PROVIDER_SITE_OTHER): Payer: Medicare HMO | Admitting: Family Medicine

## 2020-07-11 ENCOUNTER — Other Ambulatory Visit: Payer: Self-pay

## 2020-07-11 ENCOUNTER — Encounter: Payer: Self-pay | Admitting: Family Medicine

## 2020-07-11 VITALS — BP 126/75 | HR 69 | Temp 97.6°F | Resp 16 | Ht 71.0 in | Wt 150.0 lb

## 2020-07-11 DIAGNOSIS — I10 Essential (primary) hypertension: Secondary | ICD-10-CM | POA: Diagnosis not present

## 2020-07-11 DIAGNOSIS — Z8546 Personal history of malignant neoplasm of prostate: Secondary | ICD-10-CM

## 2020-07-11 DIAGNOSIS — M47817 Spondylosis without myelopathy or radiculopathy, lumbosacral region: Secondary | ICD-10-CM | POA: Diagnosis not present

## 2020-07-11 DIAGNOSIS — E119 Type 2 diabetes mellitus without complications: Secondary | ICD-10-CM | POA: Diagnosis not present

## 2020-07-11 DIAGNOSIS — Z8507 Personal history of malignant neoplasm of pancreas: Secondary | ICD-10-CM | POA: Diagnosis not present

## 2020-07-11 DIAGNOSIS — Z0000 Encounter for general adult medical examination without abnormal findings: Secondary | ICD-10-CM | POA: Diagnosis not present

## 2020-07-11 DIAGNOSIS — E7800 Pure hypercholesterolemia, unspecified: Secondary | ICD-10-CM | POA: Diagnosis not present

## 2020-07-11 DIAGNOSIS — Z23 Encounter for immunization: Secondary | ICD-10-CM

## 2020-07-11 NOTE — Progress Notes (Signed)
I,April Miller,acting as a scribe for Wilhemena Durie, MD.,have documented all relevant documentation on the behalf of Wilhemena Durie, MD,as directed by  Wilhemena Durie, MD while in the presence of Wilhemena Durie, MD.   Complete physical exam   Patient: Anthony Craig   DOB: [birth year]   86 y.o. Male  MRN: [id number] Visit Date: 07/11/2020  Today's healthcare provider: Wilhemena Durie, MD   Chief Complaint  Patient presents with  . Annual Exam   Subjective    Anthony Craig is a 86 y.o. male who presents today for a complete physical exam.  He reports consuming a general diet. Home exercise routine includes exercise ball and weights and some walking. He generally feels well. He reports sleeping poorly. He does not have additional problems to discuss today.  HPI  Patient had AWV with [facility] on 06/13/2020.  Past Medical History:  Diagnosis Date  . Cancer [facility])    pancreatic  . Coronary artery disease   . Diabetes mellitus without complication ([place])    type 2  . Hyperlipidemia   . Hypertension    Past Surgical History:  Procedure Laterality Date  . APPENDECTOMY     1977  . BILIARY DRAINAGE    . CORONARY ARTERY BYPASS GRAFT     2001-3 vessel  . HERNIA REPAIR Bilateral    2016-left, 2009-right  . WHIPPLE PROCEDURE  2007   Social History   Socioeconomic History  . Marital status: Married    Spouse name: Not on file  . Number of children: 0  . Years of education: Not on file  . Highest education level: Some college, no degree  Occupational History  . Occupation: retired  . Occupation: part time job @ [employer]  Tobacco Use  . Smoking status: Former Smoker    Packs/day: 0.50    Years: 30.00    Pack years: 15.00    Quit date: 10/06/1979    Years since quitting: 40.7  . Smokeless tobacco: Never Used  Vaping Use  . Vaping Use: Never used  Substance and Sexual Activity  . Alcohol use: Yes    Comment: Maybe a beer or 2 a month  .  Drug use: No  . Sexual activity: Not on file  Other Topics Concern  . Not on file  Social History Narrative  . Not on file   Social Determinants of Health   Financial Resource Strain: Low Risk   . Difficulty of Paying Living Expenses: Not hard at all  Food Insecurity: No Food Insecurity  . Worried About Charity fundraiser in the Last Year: Never true  . Ran Out of Food in the Last Year: Never true  Transportation Needs: No Transportation Needs  . Lack of Transportation (Medical): No  . Lack of Transportation (Non-Medical): No  Physical Activity: Sufficiently Active  . Days of Exercise per Week: 3 days  . Minutes of Exercise per Session: 60 min  Stress: No Stress Concern Present  . Feeling of Stress : Not at all  Social Connections: Moderately Integrated  . Frequency of Communication with Friends and Family: More than three times a week  . Frequency of Social Gatherings with Friends and Family: More than three times a week  . Attends Religious Services: More than 4 times per year  . Active Member of Clubs or Organizations: No  . Attends Archivist Meetings: Never  . Marital Status: Married  Human resources officer Violence: Not At  Risk  . Fear of Current or Ex-Partner: No  . Emotionally Abused: No  . Physically Abused: No  . Sexually Abused: No   Family Status  Relation Name Status  . Mother  Deceased at age 67       died of CHF  . Father  Deceased at age 51       died of MI  . Brother  Deceased at age 8       unsure of cause of death  . Other  (Not Specified)   Family History  Problem Relation Age of Onset  . Heart disease Mother   . Cancer Mother 55       breast  . Heart disease Father   . COPD Brother   . Stroke Brother   . Diabetes Other        1 and 2   Allergies  Allergen Reactions  . Doxycycline Other (See Comments)    Reaction: per [facility] patient called on 11/30/14 stating he was having a reaction to a medication possibly including redness,  swelling and itching, he was not sure at that time if it was this medication or Hydrocodone-homatropin.   Marland Kitchen Hydrocodone-Homatropine Other (See Comments)    Reaction:  Reaction: per [facility] patient called on 11/30/14 stating he was having a reaction to a medication possibly including redness, swelling and itching, he was not sure at that time if it was this medication or Doxy that he was taking at the same time. Patient has been taking Hydrocodone with no problems.    Patient Care Team: Jerrol Banana., MD as PCP - General (Family Medicine) Benito Mccreedy, MD as Referring Physician (Family Medicine) Verdia Kuba, [facility] (Pharmacist) Estill Cotta, MD (Ophthalmology) Gabriel Carina Betsey Holiday, MD as Physician Assistant (Endocrinology)   Medications: Outpatient Medications Prior to Visit  Medication Sig  . ACCU-CHEK SMARTVIEW test strip CHECK SUGAR THREE TIMES DAILY  . Alcohol Swabs (B-D SINGLE USE SWABS REGULAR) PADS USE AS DIRECTED  WITH  EACH  FINGERSTICK  . aspirin EC 81 MG tablet Take 81 mg by mouth daily.   Marland Kitchen atorvastatin (LIPITOR) 40 MG tablet TAKE 1 TABLET BY MOUTH EVERYDAY AT BEDTIME  . cholecalciferol (VITAMIN D) 1000 UNITS tablet Take 1,000 Units by mouth daily.  . Coenzyme Q10 (COQ10) 200 MG CAPS Take 100 mg by mouth daily.   . Continuous Blood Gluc Sensor (DEXCOM G4 SENSOR) MISC by Does not apply route.  . fluocinonide (LIDEX) 0.05 % external solution Apply 1 application topically daily. To affected areas of scalp   . hydrocortisone 2.5 % lotion Apply 1 application topically 2 (two) times daily. To affected areas of face only as needed for flares of itching/scaling   . hydroxypropyl methylcellulose / hypromellose (ISOPTO TEARS / GONIOVISC) 2.5 % ophthalmic solution Place 1 drop into both eyes 4 (four) times daily as needed for dry eyes.  . insulin aspart (NOVOLOG FLEXPEN) 100 UNIT/ML FlexPen Inject 4 Units into the skin 3 (three) times daily with meals. 90 day supply. Generic ok.  (Patient taking differently: Inject 7 Units into the skin 3 (three) times daily with meals. 90 day supply. Generic ok.)  . insulin glargine (LANTUS) 100 UNIT/ML injection Inject 10 Units into the skin daily.  Javier Docker Oil 500 MG CAPS Take 1 capsule by mouth daily. [place]  . lipase/protease/amylase (CREON) 36000 UNITS CPEP capsule Take 36,000 Units by mouth.  . Misc Natural Products (OSTEO BI-FLEX ADV JOINT SHIELD PO) Take by mouth 2 (  two) times daily.  . Multiple Vitamins-Minerals (MULTIVITAMIN PO) Take 1 tablet by mouth daily.  . Multiple Vitamins-Minerals (PRESERVISION AREDS PO) Take by mouth.   . omega-3 acid ethyl esters (LOVAZA) 1 g capsule Take by mouth 2 (two) times daily.   Marland Kitchen omeprazole (PRILOSEC) 20 MG capsule TAKE 1 CAPSULE BY MOUTH EVERY DAY (Patient taking differently: daily. )  . ONETOUCH VERIO test strip CHECK BLOOD SUGAR 3 TIMES A DAY (DX E11.9)  . primidone (MYSOLINE) 50 MG tablet Take 1 tablet (50 mg total) by mouth 4 (four) times daily. (Patient taking differently: Take 50 mg by mouth at bedtime. )  . SODIUM CHLORIDE, GU IRRIGANT, IR Irrigate with as directed. Irrigant every day flush drain once daily with 10 ML saline solution   . ursodiol (ACTIGALL) 300 MG capsule Take 300 mg by mouth 2 (two) times daily.  . vitamin B-12 (CYANOCOBALAMIN) 1000 MCG tablet Take 5,000 mcg by mouth daily.   Marland Kitchen b complex vitamins tablet Take 1 tablet by mouth daily. (Patient not taking: Reported on 06/18/2020)  . calcium-vitamin D 250-100 MG-UNIT per tablet Take 1 tablet by mouth daily.  (Patient not taking: Reported on 06/13/2020)  . diphenhydrAMINE (BENADRYL) 25 MG tablet Take 25 mg by mouth every 6 (six) hours as needed. (Patient not taking: Reported on 06/18/2020)  . diphenoxylate-atropine (LOMOTIL) 2.5-0.025 MG per tablet Take 1 tablet by mouth 4 (four) times daily as needed for diarrhea or loose stools. (Patient not taking: Reported on 06/18/2020)  . HYDROcodone-acetaminophen (NORCO/VICODIN) 5-325 MG  tablet Take 1-2 tablets by mouth every 4 (four) hours as needed for moderate pain. (Patient not taking: Reported on 06/18/2020)  . oxybutynin (DITROPAN XL) 5 MG 24 hr tablet Take 1 tablet (5 mg total) by mouth at bedtime. (Patient not taking: Reported on 03/13/2020)   No facility-administered medications prior to visit.    Review of Systems  Constitutional: Positive for fatigue.  HENT: Positive for tinnitus.   Eyes: Positive for itching.  Gastrointestinal: Positive for abdominal pain, constipation and diarrhea.  Musculoskeletal: Positive for back pain and neck stiffness.  Skin: Positive for color change.  Neurological: Positive for tremors.  Psychiatric/Behavioral: Positive for decreased concentration and sleep disturbance.  All other systems reviewed and are negative.   Last hemoglobin A1c Lab Results  Component Value Date   HGBA1C 7.6 (A) 03/13/2020      Objective    BP 126/75 (BP Location: Left Arm, Patient Position: Sitting, Cuff Size: Normal)   Pulse 69   Temp 97.6 F (36.4 C) (Oral)   Resp 16   Ht 5\' 11"  (1.803 m)   Wt 150 lb (68 kg)   SpO2 98%   BMI 20.92 kg/m    Physical Exam Vitals reviewed.  Constitutional:      Comments: Cachectic white male in no acute distress.  HENT:     Head: Normocephalic and atraumatic.     Right Ear: External ear normal.     Left Ear: External ear normal.  Eyes:     General: No scleral icterus.    Conjunctiva/sclera: Conjunctivae normal.  Cardiovascular:     Rate and Rhythm: Normal rate and regular rhythm.     Heart sounds: Normal heart sounds.     Comments: 1/6 to 2/6 systolic murmur heard at the apex in the right upper sternal border Pulmonary:     Effort: Pulmonary effort is normal.     Breath sounds: Normal breath sounds.  Abdominal:     Palpations: Abdomen is  soft.     Tenderness: There is no abdominal tenderness.  Musculoskeletal:     Right lower leg: No edema.     Left lower leg: No edema.  Skin:    General: Skin is  warm and dry.  Neurological:     General: No focal deficit present.     Mental Status: He is alert and oriented to person, place, and time.  Psychiatric:        Mood and Affect: Mood normal.        Behavior: Behavior normal.        Thought Content: Thought content normal.        Judgment: Judgment normal.       Last depression screening scores PHQ 2/9 Scores 06/13/2020 06/07/2019 02/16/2019  PHQ - 2 Score 0 0 0  PHQ- 9 Score - - -   Last fall risk screening Fall Risk  06/13/2020  Falls in the past year? 0  Number falls in past yr: 0  Injury with Fall? 0   Last Audit-C alcohol use screening Alcohol Use Disorder Test (AUDIT) 06/13/2020  1. How often do you have a drink containing alcohol? 2  2. How many drinks containing alcohol do you have on a typical day when you are drinking? 0  3. How often do you have six or more drinks on one occasion? 0  AUDIT-C Score 2  Alcohol Brief Interventions/Follow-up AUDIT Score <7 follow-up not indicated   A score of 3 or more in women, and 4 or more in men indicates increased risk for alcohol abuse, EXCEPT if all of the points are from question 1   No results found for any visits on 07/11/20.  Assessment & Plan    Routine Health Maintenance and Physical Exam  Exercise Activities and Dietary recommendations Goals    . Chronic Care Management     CARE PLAN ENTRY (see longitudinal plan of care for additional care plan information)  Current Barriers:  . Chronic Disease Management support, education, and care coordination needs related to Hypertension, Hyperlipidemia, and Diabetes   Hypertension BP Readings from Last 3 Encounters:  03/13/20 113/69  11/23/19 116/68  10/17/19 (!) 150/73   . Pharmacist Clinical Goal(s): o Over the next 90 days, patient will work with PharmD and providers to maintain BP goal <140/90 . Current regimen:  o Coenzyme Q10 100mg  daily . Interventions: o None . Patient self care activities - Over the next 90 days,  patient will: o Check BP weekly, document, and provide at future appointments o Ensure daily salt intake < 2300 mg/day  Hyperlipidemia Lab Results  Component Value Date/Time   LDLCALC 88 08/17/2019 08:15 AM   LDLCALC 20 01/17/2014 04:13 AM   . Pharmacist Clinical Goal(s): o Over the next 90 days, patient will work with PharmD and providers to maintain LDL goal < 100 (age modified) . Current regimen:  o Atorvastatin 40mg  daily . Interventions: o None . Patient self care activities - Over the next 90 days, patient will: o Continue current healthy lifestyle practices  Diabetes Lab Results  Component Value Date/Time   HGBA1C 7.6 (A) 03/13/2020 02:11 PM   HGBA1C 7.6 (H) 11/23/2019 03:52 PM   HGBA1C 7.4 (H) 08/17/2019 08:15 AM   HGBA1C 7.4 03/09/2017 12:00 AM   HGBA1C 9.2 11/08/2015 12:00 AM   . Pharmacist Clinical Goal(s): o Over the next 90 days, patient will work with PharmD and providers to maintain A1c goal <8% . Current regimen:  o None .  Interventions: o None . Patient self care activities - Over the next 90 days, patient will: o Check blood sugar with Dexcom, document, and provide at future appointments o Contact provider with any episodes of hypoglycemia  Erectile Dysfunction . Pharmacist Clinical Goal(s) o Over the next 90 days, patient will work with PharmD and providers to restart sexual activity with your wife . Current regimen:  o None . Interventions: o Recommended Viagra 50mg  1 tab as needed before sexual activity . Patient self care activities - Over the next 90 days, patient will: o Check with MD to see if physically able to resume sexual activity  Medication management . Pharmacist Clinical Goal(s): o Over the next 90 days, patient will work with PharmD and providers to maintain optimal medication adherence . Current pharmacy: [facility] . Interventions o Comprehensive medication review performed. o Continue current medication management  strategy . Patient self care activities - Over the next 90 days, patient will: o Focus on medication adherence by reducing the number of supplements as discussed during the visit o Take medications as prescribed o Report any questions or concerns to PharmD and/or provider(s)  Initial goal documentation     . water intake     Recommend increasing water intake to 4-6 glasses of water a day.       Immunization History  Administered Date(s) Administered  . Fluad Quad(high Dose 65+) 07/11/2020  . Influenza, High Dose Seasonal PF 06/14/2019  . Influenza,inj,Quad PF,6-35 Mos 07/01/2017  . Influenza-Unspecified 07/09/2015  . PFIZER SARS-COV-2 Vaccination 10/20/2019, 11/08/2019  . Pneumococcal Conjugate-13 11/14/2013  . Pneumococcal Polysaccharide-23 06/16/2007, 06/08/2010  . Td 06/08/2006  . Tdap 07/01/2017    Health Maintenance  Topic Date Due  . FOOT EXAM  02/03/2018  . URINE MICROALBUMIN  09/07/2019  . HEMOGLOBIN A1C  09/12/2020  . OPHTHALMOLOGY EXAM  04/24/2021  . TETANUS/TDAP  07/02/2027  . INFLUENZA VACCINE  Completed  . COVID-19 Vaccine  Completed  . PNA vac Low Risk Adult  Completed    Discussed health benefits of physical activity, and encouraged him to engage in regular exercise appropriate for his age and condition.  1. Annual physical exam   2. Need for influenza vaccination  - Flu Vaccine QUAD High Dose(Fluad)  3. Type 2 diabetes mellitus without complication, without long-term current use of insulin (HCC)  - Hemoglobin A1c - CBC with Differential/Platelet - Comprehensive metabolic panel - Lipid panel  4. Primary hypertension  - CBC with Differential/Platelet - Comprehensive metabolic panel  5. Pure hypercholesterolemia  - CBC with Differential/Platelet - Comprehensive metabolic panel - Lipid panel  6. Spondylosis of lumbosacral region without myelopathy or radiculopathy   7. History of pancreatic cancer   8. History of prostate  cancer    No follow-ups on file.     I, Wilhemena Durie, MD, have reviewed all documentation for this visit. The documentation on 07/12/20 for the exam, diagnosis, procedures, and orders are all accurate and complete.    Meredith Kilbride Cranford Mon, MD  [facility] [phone] (phone) [phone] (fax)  [place]

## 2020-07-16 DIAGNOSIS — E119 Type 2 diabetes mellitus without complications: Secondary | ICD-10-CM | POA: Diagnosis not present

## 2020-07-16 DIAGNOSIS — I10 Essential (primary) hypertension: Secondary | ICD-10-CM | POA: Diagnosis not present

## 2020-07-16 DIAGNOSIS — E7800 Pure hypercholesterolemia, unspecified: Secondary | ICD-10-CM | POA: Diagnosis not present

## 2020-07-17 LAB — CBC WITH DIFFERENTIAL/PLATELET
Basophils Absolute: 0 10*3/uL (ref 0.0–0.2)
Basos: 0 %
EOS (ABSOLUTE): 0.1 10*3/uL (ref 0.0–0.4)
Eos: 2 %
Hematocrit: 44.4 % (ref 37.5–51.0)
Hemoglobin: 14.4 g/dL (ref 13.0–17.7)
Immature Grans (Abs): 0 10*3/uL (ref 0.0–0.1)
Immature Granulocytes: 0 %
Lymphocytes Absolute: 1.2 10*3/uL (ref 0.7–3.1)
Lymphs: 24 %
MCH: 27.6 pg (ref 26.6–33.0)
MCHC: 32.4 g/dL (ref 31.5–35.7)
MCV: 85 fL (ref 79–97)
Monocytes Absolute: 0.4 10*3/uL (ref 0.1–0.9)
Monocytes: 8 %
Neutrophils Absolute: 3.2 10*3/uL (ref 1.4–7.0)
Neutrophils: 66 %
Platelets: 217 10*3/uL (ref 150–450)
RBC: 5.21 x10E6/uL (ref 4.14–5.80)
RDW: 14.2 % (ref 11.6–15.4)
WBC: 4.9 10*3/uL (ref 3.4–10.8)

## 2020-07-17 LAB — HEMOGLOBIN A1C
Est. average glucose Bld gHb Est-mCnc: 194 mg/dL
Hgb A1c MFr Bld: 8.4 % — ABNORMAL HIGH (ref 4.8–5.6)

## 2020-07-17 LAB — COMPREHENSIVE METABOLIC PANEL
ALT: 12 IU/L (ref 0–44)
AST: 13 IU/L (ref 0–40)
Albumin/Globulin Ratio: 1.7 (ref 1.2–2.2)
Albumin: 4.5 g/dL (ref 3.5–4.6)
Alkaline Phosphatase: 93 IU/L (ref 44–121)
BUN/Creatinine Ratio: 14 (ref 10–24)
BUN: 13 mg/dL (ref 10–36)
Bilirubin Total: 0.4 mg/dL (ref 0.0–1.2)
CO2: 26 mmol/L (ref 20–29)
Calcium: 9.5 mg/dL (ref 8.6–10.2)
Chloride: 100 mmol/L (ref 96–106)
Creatinine, Ser: 0.96 mg/dL (ref 0.76–1.27)
GFR calc Af Amer: 80 mL/min/{1.73_m2} (ref >59)
GFR calc non Af Amer: 69 mL/min/{1.73_m2} (ref >59)
Globulin, Total: 2.6 g/dL (ref 1.5–4.5)
Glucose: 144 mg/dL — ABNORMAL HIGH (ref 65–99)
Potassium: 4.3 mmol/L (ref 3.5–5.2)
Sodium: 138 mmol/L (ref 134–144)
Total Protein: 7.1 g/dL (ref 6.0–8.5)

## 2020-07-17 LAB — LIPID PANEL
Chol/HDL Ratio: 2.6 ratio (ref 0.0–5.0)
Cholesterol, Total: 116 mg/dL (ref 100–199)
HDL: 45 mg/dL (ref >39)
LDL Chol Calc (NIH): 55 mg/dL (ref 0–99)
Triglycerides: 79 mg/dL (ref 0–149)
VLDL Cholesterol Cal: 16 mg/dL (ref 5–40)

## 2020-07-21 ENCOUNTER — Other Ambulatory Visit: Payer: Self-pay | Admitting: Family Medicine

## 2020-07-21 DIAGNOSIS — K219 Gastro-esophageal reflux disease without esophagitis: Secondary | ICD-10-CM

## 2020-07-21 NOTE — Telephone Encounter (Signed)
Pharmacy comment: Alternative Requested:PT PAYING $10 FOR OMEPR. PT REQUESTING LOWER-COST ALTERNATIVE: PANTO. $0;

## 2020-08-09 ENCOUNTER — Ambulatory Visit
Admission: EM | Admit: 2020-08-09 | Discharge: 2020-08-09 | Disposition: A | Discharge status: Home / Self Care | Payer: Medicare HMO | Attending: Family Medicine | Admitting: Family Medicine

## 2020-08-09 DIAGNOSIS — H5789 Other specified disorders of eye and adnexa: Secondary | ICD-10-CM | POA: Diagnosis not present

## 2020-08-09 DIAGNOSIS — H00011 Hordeolum externum right upper eyelid: Secondary | ICD-10-CM | POA: Diagnosis not present

## 2020-08-09 DIAGNOSIS — H1031 Unspecified acute conjunctivitis, right eye: Secondary | ICD-10-CM

## 2020-08-09 DIAGNOSIS — H01001 Unspecified blepharitis right upper eyelid: Secondary | ICD-10-CM

## 2020-08-09 MED ORDER — ERYTHROMYCIN 5 MG/GM OP OINT: TOPICAL_OINTMENT | OPHTHALMIC | 0 refills | Status: DC

## 2020-08-09 NOTE — Discharge Instructions (Signed)
I have sent in erythromycin ointment for you to use every 6 hours for 5-7 days  Follow up with this office or with primary care if symptoms are persisting.  Follow up in the ER for high fever, trouble swallowing, trouble breathing, other concerning symptoms.

## 2020-08-09 NOTE — ED Provider Notes (Signed)
[place]   [id number] 08/09/20 Arrival Time: 5631  CC: EYE REDNESS  SUBJECTIVE:  Anthony Craig is a 86 y.o. male who presents with complaint of eye redness that began about 3 days ago. Denies a precipitating event, trauma, or close contacts with similar symptoms. Reports that he had a foreign body sensation earlier in the week, but not now. Has used artificial tears with some relief. Denies fever, chills, nausea, vomiting, eye pain, painful eye movements, halos, discharge, itching, vision changes, double vision,  Denies contact lens use.    ROS: As per HPI.  All other pertinent ROS negative.     Past Medical History:  Diagnosis Date  . Cancer [facility])    pancreatic  . Coronary artery disease   . Diabetes mellitus without complication ([place])    type 2  . Hyperlipidemia   . Hypertension    Past Surgical History:  Procedure Laterality Date  . APPENDECTOMY     1977  . BILIARY DRAINAGE    . CORONARY ARTERY BYPASS GRAFT     2001-3 vessel  . HERNIA REPAIR Bilateral    2016-left, 2009-right  . WHIPPLE PROCEDURE  2007   Allergies  Allergen Reactions  . Doxycycline Other (See Comments)    Reaction: per [place] patient called on 11/30/14 stating he was having a reaction to a medication possibly including redness, swelling and itching, he was not sure at that time if it was this medication or Hydrocodone-homatropin.   Marland Kitchen Hydrocodone-Homatropine Other (See Comments)    Reaction:  Reaction: per [facility] patient called on 11/30/14 stating he was having a reaction to a medication possibly including redness, swelling and itching, he was not sure at that time if it was this medication or Doxy that he was taking at the same time. Patient has been taking Hydrocodone with no problems.   No current facility-administered medications on file prior to encounter.   Current Outpatient Medications on File Prior to Encounter  Medication Sig Dispense Refill  . ACCU-CHEK SMARTVIEW test  strip CHECK SUGAR THREE TIMES DAILY 300 each 4  . Alcohol Swabs (B-D SINGLE USE SWABS REGULAR) PADS USE AS DIRECTED  WITH  EACH  FINGERSTICK 300 each 3  . aspirin EC 81 MG tablet Take 81 mg by mouth daily.     Marland Kitchen atorvastatin (LIPITOR) 40 MG tablet TAKE 1 TABLET BY MOUTH EVERYDAY AT BEDTIME 90 tablet 3  . b complex vitamins tablet Take 1 tablet by mouth daily. (Patient not taking: Reported on 06/18/2020)    . calcium-vitamin D 250-100 MG-UNIT per tablet Take 1 tablet by mouth daily.  (Patient not taking: Reported on 06/13/2020)    . cholecalciferol (VITAMIN D) 1000 UNITS tablet Take 1,000 Units by mouth daily.    . Coenzyme Q10 (COQ10) 200 MG CAPS Take 100 mg by mouth daily.     . Continuous Blood Gluc Sensor (DEXCOM G4 SENSOR) MISC by Does not apply route.    . diphenhydrAMINE (BENADRYL) 25 MG tablet Take 25 mg by mouth every 6 (six) hours as needed. (Patient not taking: Reported on 06/18/2020)    . diphenoxylate-atropine (LOMOTIL) 2.5-0.025 MG per tablet Take 1 tablet by mouth 4 (four) times daily as needed for diarrhea or loose stools. (Patient not taking: Reported on 06/18/2020)    . fluocinonide (LIDEX) 0.05 % external solution Apply 1 application topically daily. To affected areas of scalp     . HYDROcodone-acetaminophen (NORCO/VICODIN) 5-325 MG tablet Take 1-2 tablets by mouth every 4 (four)  hours as needed for moderate pain. (Patient not taking: Reported on 06/18/2020) 100 tablet 0  . hydrocortisone 2.5 % lotion Apply 1 application topically 2 (two) times daily. To affected areas of face only as needed for flares of itching/scaling     . hydroxypropyl methylcellulose / hypromellose (ISOPTO TEARS / GONIOVISC) 2.5 % ophthalmic solution Place 1 drop into both eyes 4 (four) times daily as needed for dry eyes.    . insulin aspart (NOVOLOG FLEXPEN) 100 UNIT/ML FlexPen Inject 4 Units into the skin 3 (three) times daily with meals. 90 day supply. Generic ok. (Patient taking differently: Inject 7 Units into  the skin 3 (three) times daily with meals. 90 day supply. Generic ok.) 15 mL 3  . insulin glargine (LANTUS) 100 UNIT/ML injection Inject 10 Units into the skin daily.    Javier Docker Oil 500 MG CAPS Take 1 capsule by mouth daily. [place]    . lipase/protease/amylase (CREON) 36000 UNITS CPEP capsule Take 36,000 Units by mouth.    . Misc Natural Products (OSTEO BI-FLEX ADV JOINT SHIELD PO) Take by mouth 2 (two) times daily.    . Multiple Vitamins-Minerals (MULTIVITAMIN PO) Take 1 tablet by mouth daily.    . Multiple Vitamins-Minerals (PRESERVISION AREDS PO) Take by mouth.     . omega-3 acid ethyl esters (LOVAZA) 1 g capsule Take by mouth 2 (two) times daily.     Glory Rosebush VERIO test strip CHECK BLOOD SUGAR 3 TIMES A DAY (DX E11.9) 300 strip 3  . oxybutynin (DITROPAN XL) 5 MG 24 hr tablet Take 1 tablet (5 mg total) by mouth at bedtime. (Patient not taking: Reported on 03/13/2020) 30 tablet 11  . pantoprazole (PROTONIX) 40 MG tablet Take 1 tablet (40 mg total) by mouth daily. 90 tablet 0  . primidone (MYSOLINE) 50 MG tablet Take 1 tablet (50 mg total) by mouth 4 (four) times daily. (Patient taking differently: Take 50 mg by mouth at bedtime. ) 360 tablet 3  . SODIUM CHLORIDE, GU IRRIGANT, IR Irrigate with as directed. Irrigant every day flush drain once daily with 10 ML saline solution     . ursodiol (ACTIGALL) 300 MG capsule Take 300 mg by mouth 2 (two) times daily.    . vitamin B-12 (CYANOCOBALAMIN) 1000 MCG tablet Take 5,000 mcg by mouth daily.      Social History   Socioeconomic History  . Marital status: Married    Spouse name: Not on file  . Number of children: 0  . Years of education: Not on file  . Highest education level: Some college, no degree  Occupational History  . Occupation: retired  . Occupation: part time job @ [employer]  Tobacco Use  . Smoking status: Former Smoker    Packs/day: 0.50    Years: 30.00    Pack years: 15.00    Quit date: 10/06/1979    Years since quitting:  40.8  . Smokeless tobacco: Never Used  Vaping Use  . Vaping Use: Never used  Substance and Sexual Activity  . Alcohol use: Yes    Comment: Maybe a beer or 2 a month  . Drug use: No  . Sexual activity: Not on file  Other Topics Concern  . Not on file  Social History Narrative  . Not on file   Social Determinants of Health   Financial Resource Strain: Low Risk   . Difficulty of Paying Living Expenses: Not hard at all  Food Insecurity: No Food Insecurity  . Worried About  Running Out of Food in the Last Year: Never true  . Ran Out of Food in the Last Year: Never true  Transportation Needs: No Transportation Needs  . Lack of Transportation (Medical): No  . Lack of Transportation (Non-Medical): No  Physical Activity: Sufficiently Active  . Days of Exercise per Week: 3 days  . Minutes of Exercise per Session: 60 min  Stress: No Stress Concern Present  . Feeling of Stress : Not at all  Social Connections: Moderately Integrated  . Frequency of Communication with Friends and Family: More than three times a week  . Frequency of Social Gatherings with Friends and Family: More than three times a week  . Attends Religious Services: More than 4 times per year  . Active Member of Clubs or Organizations: No  . Attends Archivist Meetings: Never  . Marital Status: Married  Human resources officer Violence: Not At Risk  . Fear of Current or Ex-Partner: No  . Emotionally Abused: No  . Physically Abused: No  . Sexually Abused: No   Family History  Problem Relation Age of Onset  . Heart disease Mother   . Cancer Mother 31       breast  . Heart disease Father   . COPD Brother   . Stroke Brother   . Diabetes Other        1 and 2    OBJECTIVE:    Visual Acuity  Right Eye Distance: 20/30 Left Eye Distance: 20/20 Bilateral Distance: 20/20     Vitals:   08/09/20 1349 08/09/20 1350  BP: (!) 154/76   Pulse: 66   Resp: 16   Temp: 98.1 F (36.7 C)   TempSrc: Oral   SpO2: 95%    Weight:  151 lb (68.5 kg)  Height:  5' 11.5" (1.816 m)    General appearance: alert; no distress Eyes: very small hordeolum to R upper eyelid, R upper lid erythematous and mildly swollen PERRL; EOMI without discomfort;  no obvious drainage; lid everted without obvious FB; no obvious fluorescein uptake  Neck: supple Lungs: clear to auscultation bilaterally Heart: regular rate and rhythm Skin: warm and dry Psychological: alert and cooperative; normal mood and affect   ASSESSMENT & PLAN:  1. Blepharitis of right upper eyelid, unspecified type   2. Redness of right eye   3. Hordeolum externum of right upper eyelid     Meds ordered this encounter  Medications  . erythromycin ophthalmic ointment    Sig: Place a 1/2 inch ribbon of ointment into the lower eyelid.    Dispense:  3.5 g    Refill:  0    Order Specific Question:   Supervising Provider    Answer:   Chase Picket [[id number]]    Blepharitis  Hordeolum Prescribed erythromycin ointment Take as directed and to completion Dispose of old contacts and wear glasses until you have finished course of antibiotic eye drops Wash pillow cases, wash hands regularly with soap and water, avoid touching your face and eyes, wash door handles, light switches, remotes and other objects you frequently touch Return or follow up with PCP if symptoms persists such as fever, chills, redness, swelling, eye pain, painful eye movements, vision changes   Reviewed expectations re: course of current medical issues. Questions answered. Outlined signs and symptoms indicating need for more acute intervention. Patient verbalized understanding. After Visit Summary given.   Faustino Congress, NP 08/10/20 [phone]

## 2020-08-09 NOTE — ED Triage Notes (Signed)
Pt reports having R eye pain and swelling. sts "its just the top lid that's bothering me". Reports using artifical tears, there have been some relief.  No known injury to eye.

## 2020-08-17 ENCOUNTER — Telehealth: Payer: Self-pay

## 2020-08-17 NOTE — Telephone Encounter (Signed)
Copied from [place] [id number]. Topic: General - Call Back - No Documentation >> Aug 17, 2020  3:28 PM Erick Blinks wrote: Reason for CRM: Pt has questions regarding the booster vaccine, he and his wife both had covid. They both have been vaccinated with [place], pt wants guidance concerning the booster. Please advise   Best contact: [phone]

## 2020-08-21 NOTE — Telephone Encounter (Signed)
Pt schedule a booster shot for 09/13/2020 starting at 10:45  Thanks,   -Mickel Baas

## 2020-09-13 ENCOUNTER — Other Ambulatory Visit: Payer: Self-pay

## 2020-09-13 ENCOUNTER — Ambulatory Visit (INDEPENDENT_AMBULATORY_CARE_PROVIDER_SITE_OTHER): Payer: Medicare HMO

## 2020-09-13 DIAGNOSIS — Z23 Encounter for immunization: Secondary | ICD-10-CM

## 2020-09-14 ENCOUNTER — Telehealth: Payer: Self-pay

## 2020-09-14 NOTE — Telephone Encounter (Signed)
Copied from [place] [id number]. Topic: General - Other >> Sep 14, 2020  9:29 AM Rainey Pines A wrote: Patient would like documentation of covid booster shot mailed to him. Please advise

## 2020-09-14 NOTE — Telephone Encounter (Signed)
Done

## 2020-09-17 ENCOUNTER — Telehealth: Payer: Self-pay

## 2020-09-17 NOTE — Chronic Care Management (AMB) (Deleted)
Chronic Care Management Pharmacy  Name: Anthony Craig  MRN: [id number] DOB: 03-22-1928  Chief Complaint/ HPI  Anthony Craig,  86 y.o. , male presents for their Follow-Up CCM visit with the clinical pharmacist via telephone.  PCP : Jerrol Banana., MD  Their chronic conditions include: Hypertension, Hyperlipidemia, Diabetes, and GERD  Office Visits: 07/11/20: Patient presented to Dr. Rosanna Randy for follow-up. A1c worsened to 8.4%  6/8 DM, [place], BP 113/69 P 65 157 BMI 21.9  Consult Visit: 08/09/20: Patient presented to ED for blepharitis of eye.   Medications: Outpatient Encounter Medications as of 09/17/2020  Medication Sig Note  . ACCU-CHEK SMARTVIEW test strip CHECK SUGAR THREE TIMES DAILY   . Alcohol Swabs (B-D SINGLE USE SWABS REGULAR) PADS USE AS DIRECTED  WITH  EACH  FINGERSTICK   . aspirin EC 81 MG tablet Take 81 mg by mouth daily.    Marland Kitchen atorvastatin (LIPITOR) 40 MG tablet TAKE 1 TABLET BY MOUTH EVERYDAY AT BEDTIME   . b complex vitamins tablet Take 1 tablet by mouth daily. (Patient not taking: Reported on 06/18/2020)   . calcium-vitamin D 250-100 MG-UNIT per tablet Take 1 tablet by mouth daily.  (Patient not taking: Reported on 06/13/2020)   . cholecalciferol (VITAMIN D) 1000 UNITS tablet Take 1,000 Units by mouth daily.   . Coenzyme Q10 (COQ10) 200 MG CAPS Take 100 mg by mouth daily.    . Continuous Blood Gluc Sensor (DEXCOM G4 SENSOR) MISC by Does not apply route.   . diphenhydrAMINE (BENADRYL) 25 MG tablet Take 25 mg by mouth every 6 (six) hours as needed. (Patient not taking: Reported on 06/18/2020) 11/10/2016: prn  . diphenoxylate-atropine (LOMOTIL) 2.5-0.025 MG per tablet Take 1 tablet by mouth 4 (four) times daily as needed for diarrhea or loose stools. (Patient not taking: Reported on 06/18/2020)   . erythromycin ophthalmic ointment Place a 1/2 inch ribbon of ointment into the lower eyelid.   . fluocinonide (LIDEX) 0.05 % external solution Apply 1  application topically daily. To affected areas of scalp    . HYDROcodone-acetaminophen (NORCO/VICODIN) 5-325 MG tablet Take 1-2 tablets by mouth every 4 (four) hours as needed for moderate pain. (Patient not taking: Reported on 06/18/2020)   . hydrocortisone 2.5 % lotion Apply 1 application topically 2 (two) times daily. To affected areas of face only as needed for flares of itching/scaling    . hydroxypropyl methylcellulose / hypromellose (ISOPTO TEARS / GONIOVISC) 2.5 % ophthalmic solution Place 1 drop into both eyes 4 (four) times daily as needed for dry eyes.   . insulin aspart (NOVOLOG FLEXPEN) 100 UNIT/ML FlexPen Inject 4 Units into the skin 3 (three) times daily with meals. 90 day supply. Generic ok. (Patient taking differently: Inject 7 Units into the skin 3 (three) times daily with meals. 90 day supply. Generic ok.) 06/13/2020: 5 units at breakfast, 3 units at lunch and 5 units in PM  . insulin glargine (LANTUS) 100 UNIT/ML injection Inject 10 Units into the skin daily. 06/13/2020: At bedtime  . Krill Oil 500 MG CAPS Take 1 capsule by mouth daily. [place]   . lipase/protease/amylase (CREON) 36000 UNITS CPEP capsule Take 36,000 Units by mouth.   . Misc Natural Products (OSTEO BI-FLEX ADV JOINT SHIELD PO) Take by mouth 2 (two) times daily.   . Multiple Vitamins-Minerals (MULTIVITAMIN PO) Take 1 tablet by mouth daily.   . Multiple Vitamins-Minerals (PRESERVISION AREDS PO) Take by mouth.    . omega-3 acid ethyl esters (LOVAZA) 1  g capsule Take by mouth 2 (two) times daily.    Glory Rosebush VERIO test strip CHECK BLOOD SUGAR 3 TIMES A DAY (DX E11.9)   . oxybutynin (DITROPAN XL) 5 MG 24 hr tablet Take 1 tablet (5 mg total) by mouth at bedtime. (Patient not taking: Reported on 03/13/2020)   . pantoprazole (PROTONIX) 40 MG tablet Take 1 tablet (40 mg total) by mouth daily.   . primidone (MYSOLINE) 50 MG tablet Take 1 tablet (50 mg total) by mouth 4 (four) times daily. (Patient taking differently: Take 50 mg  by mouth at bedtime. )   . SODIUM CHLORIDE, GU IRRIGANT, IR Irrigate with as directed. Irrigant every day flush drain once daily with 10 ML saline solution    . ursodiol (ACTIGALL) 300 MG capsule Take 300 mg by mouth 2 (two) times daily.   . vitamin B-12 (CYANOCOBALAMIN) 1000 MCG tablet Take 5,000 mcg by mouth daily.     No facility-administered encounter medications on file as of 09/17/2020.    Financial Resource Strain: Low Risk   . Difficulty of Paying Living Expenses: Not hard at all    Current Diagnosis/Assessment:  Goals Addressed   None    Diabetes   A1c goal <8% given age   Recent Relevant Labs: Lab Results  Component Value Date/Time   HGBA1C 8.4 (H) 07/16/2020 09:15 AM   HGBA1C 7.6 (A) 03/13/2020 02:11 PM   HGBA1C 7.6 (H) 11/23/2019 03:52 PM   HGBA1C 7.4 03/09/2017 12:00 AM   HGBA1C 9.2 11/08/2015 12:00 AM    Last diabetic Foot exam:  Lab Results  Component Value Date/Time   HMDIABEYEEXA No Retinopathy 04/24/2020 12:00 AM    Last diabetic Eye exam: No results found for: HMDIABFOOTEX   Patient has failed these meds in past: NA Patient is currently controlled on the following medications:  . Novolog 4 or 7 units TID  . Lantus 10 units daily   We discussed:   Plan  Continue current medications   Hypertension   BP goal is:  <140/90  Office blood pressures are  BP Readings from Last 3 Encounters:  08/09/20 (!) 154/76  07/11/20 126/75  03/13/20 113/69   Patient checks BP at home infrequently Patient home BP readings are ranging: NA  Patient has failed these meds in the past: NA Patient is currently controlled on the following medications:  . None  We discussed: Relaxed goals for age 37 Denies orthostasis or hypotension  Plan  Continue control with diet and exercise     Hyperlipidemia   LDL goal < 100  Lipid Panel     Component Value Date/Time   CHOL 116 07/16/2020 0915   CHOL 66 01/17/2014 0413   TRIG 79 07/16/2020 0915   TRIG 50  01/17/2014 0413   HDL 45 07/16/2020 0915   HDL 36 (L) 01/17/2014 0413   LDLCALC 55 07/16/2020 0915   LDLCALC 20 01/17/2014 0413    Hepatic Function Latest Ref Rng & Units 07/16/2020 11/23/2019 10/17/2019  Total Protein 6.0 - 8.5 g/dL 7.1 6.8 7.1  Albumin 3.5 - 4.6 g/dL 4.5 4.4 4.2  AST 0 - 40 IU/L 13 18 95(H)  ALT 0 - 44 IU/L 12 13 42  Alk Phosphatase 44 - 121 IU/L 93 91 93  Total Bilirubin 0.0 - 1.2 mg/dL 0.4 0.3 0.9  Bilirubin, Direct 0.00 - 0.40 mg/dL - - -     The ASCVD Risk score Mikey Bussing DC Jr., et al., 2013) failed to calculate for the following  reasons:   The 2013 ASCVD risk score is only valid for ages 61 to 81   The patient has a prior MI or stroke diagnosis   Patient has failed these meds in past: NA Patient is currently controlled on the following medications:  . Atorvastatin 25m daily . Lovaza 1 g twice daily   We discussed:   Plan  Continue current medications  Medication Management   Pt uses [place] medications Uses pill box? Yes Pt endorses 100% compliance  We discussed:  Plan  Continue current medication management strategy  Follow up: 3 month phone visit  AMount [phone]

## 2020-09-21 ENCOUNTER — Telehealth: Payer: Self-pay | Admitting: Family Medicine

## 2020-09-21 NOTE — Telephone Encounter (Signed)
Patient is calling to speak to [facility] in the pharmacy- Patient would like to know can he take Doxylamine succinate 25 mg because he is waking up during the night and not always to go the bathroom. Patient states that he can purchase it otc. Patient wants to know will it infer with any of his other meds that he is taking. CB- [phone]

## 2020-09-21 NOTE — Telephone Encounter (Signed)
Alex our pharmacist is out of the office today. Please review. Thanks!

## 2020-09-21 NOTE — Telephone Encounter (Signed)
Wants to try Doxylamine 25 mg hs instead of the Benadryl to help with sleep. Advised this is a similar antihistamine found in [place] for sleep. He must realize it can cause some drowsiness in the mornings and he should not take it with any Benadryl. He will report response next week.

## 2020-10-03 ENCOUNTER — Telehealth: Payer: Self-pay

## 2020-10-03 NOTE — Progress Notes (Signed)
Chronic Care Management Pharmacy Assistant   Name: Anthony Craig  MRN: [id number] DOB: [birth year]  Reason for Encounter: Medication Review   Anthony Craig,  86 y.o. , male .  PCP : Maple Hudson., MD  Allergies:   Allergies  Allergen Reactions  . Doxycycline Other (See Comments)    Reaction: per [facility] patient called on 11/30/14 stating he was having a reaction to a medication possibly including redness, swelling and itching, he was not sure at that time if it was this medication or Hydrocodone-homatropin.   Marland Kitchen Hydrocodone-Homatropine Other (See Comments)    Reaction:  Reaction: per [facility] patient called on 11/30/14 stating he was having a reaction to a medication possibly including redness, swelling and itching, he was not sure at that time if it was this medication or Doxy that he was taking at the same time. Patient has been taking Hydrocodone with no problems.    Medications: Outpatient Encounter Medications as of 10/03/2020  Medication Sig Note  . ACCU-CHEK SMARTVIEW test strip CHECK SUGAR THREE TIMES DAILY   . Alcohol Swabs (B-D SINGLE USE SWABS REGULAR) PADS USE AS DIRECTED  WITH  EACH  FINGERSTICK   . aspirin EC 81 MG tablet Take 81 mg by mouth daily.    Marland Kitchen atorvastatin (LIPITOR) 40 MG tablet TAKE 1 TABLET BY MOUTH EVERYDAY AT BEDTIME   . b complex vitamins tablet Take 1 tablet by mouth daily. (Patient not taking: Reported on 06/18/2020)   . calcium-vitamin D 250-100 MG-UNIT per tablet Take 1 tablet by mouth daily.  (Patient not taking: Reported on 06/13/2020)   . cholecalciferol (VITAMIN D) 1000 UNITS tablet Take 1,000 Units by mouth daily.   . Coenzyme Q10 (COQ10) 200 MG CAPS Take 100 mg by mouth daily.    . Continuous Blood Gluc Sensor (DEXCOM G4 SENSOR) MISC by Does not apply route.   . diphenhydrAMINE (BENADRYL) 25 MG tablet Take 25 mg by mouth every 6 (six) hours as needed. (Patient not taking: Reported on 06/18/2020) 11/10/2016: prn  .  diphenoxylate-atropine (LOMOTIL) 2.5-0.025 MG per tablet Take 1 tablet by mouth 4 (four) times daily as needed for diarrhea or loose stools. (Patient not taking: Reported on 06/18/2020)   . erythromycin ophthalmic ointment Place a 1/2 inch ribbon of ointment into the lower eyelid.   . fluocinonide (LIDEX) 0.05 % external solution Apply 1 application topically daily. To affected areas of scalp    . HYDROcodone-acetaminophen (NORCO/VICODIN) 5-325 MG tablet Take 1-2 tablets by mouth every 4 (four) hours as needed for moderate pain. (Patient not taking: Reported on 06/18/2020)   . hydrocortisone 2.5 % lotion Apply 1 application topically 2 (two) times daily. To affected areas of face only as needed for flares of itching/scaling    . hydroxypropyl methylcellulose / hypromellose (ISOPTO TEARS / GONIOVISC) 2.5 % ophthalmic solution Place 1 drop into both eyes 4 (four) times daily as needed for dry eyes.   . insulin aspart (NOVOLOG FLEXPEN) 100 UNIT/ML FlexPen Inject 4 Units into the skin 3 (three) times daily with meals. 90 day supply. Generic ok. (Patient taking differently: Inject 7 Units into the skin 3 (three) times daily with meals. 90 day supply. Generic ok.) 06/13/2020: 5 units at breakfast, 3 units at lunch and 5 units in PM  . insulin glargine (LANTUS) 100 UNIT/ML injection Inject 10 Units into the skin daily. 06/13/2020: At bedtime  . Krill Oil 500 MG CAPS Take 1 capsule by mouth daily. [place]   .  lipase/protease/amylase (CREON) 36000 UNITS CPEP capsule Take 36,000 Units by mouth.   . Misc Natural Products (OSTEO BI-FLEX ADV JOINT SHIELD PO) Take by mouth 2 (two) times daily.   . Multiple Vitamins-Minerals (MULTIVITAMIN PO) Take 1 tablet by mouth daily.   . Multiple Vitamins-Minerals (PRESERVISION AREDS PO) Take by mouth.    . omega-3 acid ethyl esters (LOVAZA) 1 g capsule Take by mouth 2 (two) times daily.    Letta Pate VERIO test strip CHECK BLOOD SUGAR 3 TIMES A DAY (DX E11.9)   . oxybutynin  (DITROPAN XL) 5 MG 24 hr tablet Take 1 tablet (5 mg total) by mouth at bedtime. (Patient not taking: Reported on 03/13/2020)   . pantoprazole (PROTONIX) 40 MG tablet Take 1 tablet (40 mg total) by mouth daily.   . primidone (MYSOLINE) 50 MG tablet Take 1 tablet (50 mg total) by mouth 4 (four) times daily. (Patient taking differently: Take 50 mg by mouth at bedtime. )   . SODIUM CHLORIDE, GU IRRIGANT, IR Irrigate with as directed. Irrigant every day flush drain once daily with 10 ML saline solution    . ursodiol (ACTIGALL) 300 MG capsule Take 300 mg by mouth 2 (two) times daily.   . vitamin B-12 (CYANOCOBALAMIN) 1000 MCG tablet Take 5,000 mcg by mouth daily.     No facility-administered encounter medications on file as of 10/03/2020.    Current Diagnosis: Patient Active Problem List   Diagnosis Date Noted  . Chest pain 04/27/2015  . Allergic drug reaction 02/08/2015  . B12 deficiency 02/08/2015  . 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 Chest discomfort 02/08/2015  . Diabetes (HCC) 02/08/2015  . Dizziness 02/08/2015  . Benign essential tremor 02/08/2015  . Fatigue 02/08/2015  . Acid reflux 02/08/2015  . HLD (hyperlipidemia) 02/08/2015  . BP (high blood pressure) 02/08/2015  . Hyperthyroidism 02/08/2015  . Hernia, inguinal, left 02/08/2015  . Cannot sleep 02/08/2015  . Cancer of pancreas (HCC) 02/08/2015  . Pancreatitis 02/08/2015  . CA of prostate (HCC) 02/08/2015       Reviewed chart and adherence measures. Per insurance data, Anthony Craig does not have any adherence data available. He does not show any care Gaps according to insurance.  Angelena Sole PharmD, notified.  Follow-Up:  Pharmacist Review   [facility] CMA, [place]

## 2020-10-03 NOTE — Progress Notes (Signed)
Chronic Care Management Pharmacy Assistant   Name: Anthony Craig  MRN: [id number] DOB: Jun 22, 1928  Reason for Encounter: Medication Review  PCP : Maple Hudson., MD  Allergies:   Allergies  Allergen Reactions  . Doxycycline Other (See Comments)    Reaction: per [facility] patient called on 11/30/14 stating he was having a reaction to a medication possibly including redness, swelling and itching, he was not sure at that time if it was this medication or Hydrocodone-homatropin.   Marland Kitchen Hydrocodone-Homatropine Other (See Comments)    Reaction:  Reaction: per [facility] patient called on 11/30/14 stating he was having a reaction to a medication possibly including redness, swelling and itching, he was not sure at that time if it was this medication or Doxy that he was taking at the same time. Patient has been taking Hydrocodone with no problems.    Medications: Outpatient Encounter Medications as of 10/03/2020  Medication Sig Note  . ACCU-CHEK SMARTVIEW test strip CHECK SUGAR THREE TIMES DAILY   . Alcohol Swabs (B-D SINGLE USE SWABS REGULAR) PADS USE AS DIRECTED  WITH  EACH  FINGERSTICK   . aspirin EC 81 MG tablet Take 81 mg by mouth daily.    Marland Kitchen atorvastatin (LIPITOR) 40 MG tablet TAKE 1 TABLET BY MOUTH EVERYDAY AT BEDTIME   . b complex vitamins tablet Take 1 tablet by mouth daily. (Patient not taking: Reported on 06/18/2020)   . calcium-vitamin D 250-100 MG-UNIT per tablet Take 1 tablet by mouth daily.  (Patient not taking: Reported on 06/13/2020)   . cholecalciferol (VITAMIN D) 1000 UNITS tablet Take 1,000 Units by mouth daily.   . Coenzyme Q10 (COQ10) 200 MG CAPS Take 100 mg by mouth daily.    . Continuous Blood Gluc Sensor (DEXCOM G4 SENSOR) MISC by Does not apply route.   . diphenhydrAMINE (BENADRYL) 25 MG tablet Take 25 mg by mouth every 6 (six) hours as needed. (Patient not taking: Reported on 06/18/2020) 11/10/2016: prn  . diphenoxylate-atropine (LOMOTIL) 2.5-0.025 MG per tablet  Take 1 tablet by mouth 4 (four) times daily as needed for diarrhea or loose stools. (Patient not taking: Reported on 06/18/2020)   . erythromycin ophthalmic ointment Place a 1/2 inch ribbon of ointment into the lower eyelid.   . fluocinonide (LIDEX) 0.05 % external solution Apply 1 application topically daily. To affected areas of scalp    . HYDROcodone-acetaminophen (NORCO/VICODIN) 5-325 MG tablet Take 1-2 tablets by mouth every 4 (four) hours as needed for moderate pain. (Patient not taking: Reported on 06/18/2020)   . hydrocortisone 2.5 % lotion Apply 1 application topically 2 (two) times daily. To affected areas of face only as needed for flares of itching/scaling    . hydroxypropyl methylcellulose / hypromellose (ISOPTO TEARS / GONIOVISC) 2.5 % ophthalmic solution Place 1 drop into both eyes 4 (four) times daily as needed for dry eyes.   . insulin aspart (NOVOLOG FLEXPEN) 100 UNIT/ML FlexPen Inject 4 Units into the skin 3 (three) times daily with meals. 90 day supply. Generic ok. (Patient taking differently: Inject 7 Units into the skin 3 (three) times daily with meals. 90 day supply. Generic ok.) 06/13/2020: 5 units at breakfast, 3 units at lunch and 5 units in PM  . insulin glargine (LANTUS) 100 UNIT/ML injection Inject 10 Units into the skin daily. 06/13/2020: At bedtime  . Krill Oil 500 MG CAPS Take 1 capsule by mouth daily. [place]   . lipase/protease/amylase (CREON) 36000 UNITS CPEP capsule Take 36,000 Units by  mouth.   . Misc Natural Products (OSTEO BI-FLEX ADV JOINT SHIELD PO) Take by mouth 2 (two) times daily.   . Multiple Vitamins-Minerals (MULTIVITAMIN PO) Take 1 tablet by mouth daily.   . Multiple Vitamins-Minerals (PRESERVISION AREDS PO) Take by mouth.    . omega-3 acid ethyl esters (LOVAZA) 1 g capsule Take by mouth 2 (two) times daily.    Letta Pate VERIO test strip CHECK BLOOD SUGAR 3 TIMES A DAY (DX E11.9)   . oxybutynin (DITROPAN XL) 5 MG 24 hr tablet Take 1 tablet (5 mg total) by  mouth at bedtime. (Patient not taking: Reported on 03/13/2020)   . pantoprazole (PROTONIX) 40 MG tablet Take 1 tablet (40 mg total) by mouth daily.   . primidone (MYSOLINE) 50 MG tablet Take 1 tablet (50 mg total) by mouth 4 (four) times daily. (Patient taking differently: Take 50 mg by mouth at bedtime. )   . SODIUM CHLORIDE, GU IRRIGANT, IR Irrigate with as directed. Irrigant every day flush drain once daily with 10 ML saline solution    . ursodiol (ACTIGALL) 300 MG capsule Take 300 mg by mouth 2 (two) times daily.   . vitamin B-12 (CYANOCOBALAMIN) 1000 MCG tablet Take 5,000 mcg by mouth daily.     No facility-administered encounter medications on file as of 10/03/2020.    Current Diagnosis: Patient Active Problem List   Diagnosis Date Noted  . Chest pain 04/27/2015  . Allergic drug reaction 02/08/2015  . B12 deficiency 02/08/2015  . 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 Chest discomfort 02/08/2015  . Diabetes (HCC) 02/08/2015  . Dizziness 02/08/2015  . Benign essential tremor 02/08/2015  . Fatigue 02/08/2015  . Acid reflux 02/08/2015  . HLD (hyperlipidemia) 02/08/2015  . BP (high blood pressure) 02/08/2015  . Hyperthyroidism 02/08/2015  . Hernia, inguinal, left 02/08/2015  . Cannot sleep 02/08/2015  . Cancer of pancreas (HCC) 02/08/2015  . Pancreatitis 02/08/2015  . CA of prostate (HCC) 02/08/2015    Goals Addressed   None     Performed cost analysis for patient, estimated monthly  medication cost of $360.55.  Follow-Up:  Pharmacist Review   Everlean Cherry Clinical Pharmacist Assistant [phone]

## 2020-10-24 ENCOUNTER — Other Ambulatory Visit: Payer: Self-pay | Admitting: Family Medicine

## 2020-10-24 DIAGNOSIS — K219 Gastro-esophageal reflux disease without esophagitis: Secondary | ICD-10-CM

## 2020-11-28 ENCOUNTER — Ambulatory Visit (INDEPENDENT_AMBULATORY_CARE_PROVIDER_SITE_OTHER): Payer: Medicare PPO | Admitting: Adult Health

## 2020-11-28 ENCOUNTER — Encounter: Payer: Self-pay | Admitting: Adult Health

## 2020-11-28 DIAGNOSIS — K219 Gastro-esophageal reflux disease without esophagitis: Secondary | ICD-10-CM

## 2020-11-28 DIAGNOSIS — J069 Acute upper respiratory infection, unspecified: Secondary | ICD-10-CM

## 2020-11-28 DIAGNOSIS — R42 Dizziness and giddiness: Secondary | ICD-10-CM | POA: Diagnosis not present

## 2020-11-28 NOTE — Progress Notes (Signed)
Virtual telephone visit    Virtual Visit via Telephone Note   This visit type was conducted due to national recommendations for restrictions regarding the COVID-19 Pandemic (e.g. social distancing) in an effort to limit this patient's exposure and mitigate transmission in our community. Due to his co-morbid illnesses, this patient is at least at moderate risk for complications without adequate follow up. This format is felt to be most appropriate for this patient at this time. The patient did not have access to video technology or had technical difficulties with video requiring transitioning to audio format only (telephone). Physical exam was limited to content and character of the telephone converstion.   Parties involved in visit as below:    Patient location: at home  Provider location: Provider: Provider's office at  [facility], [place].   I discussed the limitations of evaluation and management by telemedicine and the availability of in person appointments. The patient expressed understanding and agreed to proceed.   Visit Date: 11/28/2020  Today's healthcare provider: Marcille Buffy, FNP   No chief complaint on file.  Subjective    HPI  Upper Respiratory Infection: Patient complains of symptoms of a URI. Symptoms include congestion and cough. Onset of symptoms was 5 days ago, gradually improving since that time. He also c/o no  fever and post nasal drip for the past 5 days   He is drinking plenty of fluids. Evaluation to date: seen previously and thought to have a viral URI. Treatment to date: none.  He feels the best he has in 5 days, he was seen at the [place] last Wednesday for left upper abdominal pain. He reports they diagnosed him with probable gastritis. He also spoke to [place] regarding some mild vertigo he was having. He was given exercises to do to help resolve. He reports mild to moderate improvement.  He has been doing the increased hydration,  and exercises, he reports vertigo when bending forward.  He has history of pancreatic surgery and Whipple surgery. He did have CT scan of abdomen and was told he had a " tiny insignificant spot on his liver".    Denies any pain with breathing or distress.  Denies any change in bowel or bladder habits.  He was tested for  covid at the [place] on day 2 of symptoms.   He has ran out of his omeprazole and forgot to get it refilled, the [place] sent it in and he has started it back and his stomach issues resolved. He denies any pain now.    Patient  denies any fever, body aches,chills, rash, chest pain, shortness of breath, nausea, vomiting, or diarrhea.  Denies dizziness, lightheadedness, pre syncopal or syncopal episodes.   Patient Active Problem List   Diagnosis Date Noted  . Viral upper respiratory tract infection 11/28/2020  . Chest pain 04/27/2015  . Allergic drug reaction 02/08/2015  . B12 deficiency 02/08/2015  . 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 Chest discomfort 02/08/2015  . Diabetes ([place]) 02/08/2015  . Vertigo 02/08/2015  . Benign essential tremor 02/08/2015  . Fatigue 02/08/2015  . Acid reflux 02/08/2015  . HLD (hyperlipidemia) 02/08/2015  . BP (high blood pressure) 02/08/2015  . Hyperthyroidism 02/08/2015  . Hernia, inguinal, left 02/08/2015  . Cannot sleep 02/08/2015  . Cancer of pancreas ([place]) 02/08/2015  . Pancreatitis 02/08/2015  . CA of prostate ([place]) 02/08/2015   Past Medical History:  Diagnosis Date  . Cancer [facility])    pancreatic  .  Coronary artery disease   . Diabetes mellitus without complication ([place])    type 2  . Hyperlipidemia   . Hypertension    Past Surgical History:  Procedure Laterality Date  . APPENDECTOMY     1977  . BILIARY DRAINAGE    . CORONARY ARTERY BYPASS GRAFT     2001-3 vessel  . HERNIA REPAIR Bilateral    2016-left, 2009-right  . WHIPPLE PROCEDURE  2007   Social History   Tobacco Use  . Smoking status: Former Smoker     Packs/day: 0.50    Years: 30.00    Pack years: 15.00    Quit date: 10/06/1979    Years since quitting: 41.1  . Smokeless tobacco: Never Used  Vaping Use  . Vaping Use: Never used  Substance Use Topics  . Alcohol use: Yes    Comment: Maybe a beer or 2 a month  . Drug use: No   Social History   Socioeconomic History  . Marital status: Married    Spouse name: Not on file  . Number of children: 0  . Years of education: Not on file  . Highest education level: Some college, no degree  Occupational History  . Occupation: retired  . Occupation: part time job @ [employer]  Tobacco Use  . Smoking status: Former Smoker    Packs/day: 0.50    Years: 30.00    Pack years: 15.00    Quit date: 10/06/1979    Years since quitting: 41.1  . Smokeless tobacco: Never Used  Vaping Use  . Vaping Use: Never used  Substance and Sexual Activity  . Alcohol use: Yes    Comment: Maybe a beer or 2 a month  . Drug use: No  . Sexual activity: Not on file  Other Topics Concern  . Not on file  Social History Narrative  . Not on file   Social Determinants of Health   Financial Resource Strain: Low Risk   . Difficulty of Paying Living Expenses: Not hard at all  Food Insecurity: No Food Insecurity  . Worried About Charity fundraiser in the Last Year: Never true  . Ran Out of Food in the Last Year: Never true  Transportation Needs: No Transportation Needs  . Lack of Transportation (Medical): No  . Lack of Transportation (Non-Medical): No  Physical Activity: Sufficiently Active  . Days of Exercise per Week: 3 days  . Minutes of Exercise per Session: 60 min  Stress: No Stress Concern Present  . Feeling of Stress : Not at all  Social Connections: Moderately Integrated  . Frequency of Communication with Friends and Family: More than three times a week  . Frequency of Social Gatherings with Friends and Family: More than three times a week  . Attends Religious Services: More than 4 times per year   . Active Member of Clubs or Organizations: No  . Attends Archivist Meetings: Never  . Marital Status: Married  Human resources officer Violence: Not At Risk  . Fear of Current or Ex-Partner: No  . Emotionally Abused: No  . Physically Abused: No  . Sexually Abused: No   Family Status  Relation Name Status  . Mother  Deceased at age 73       died of CHF  . Father  Deceased at age 27       died of MI  . Brother  Deceased at age 64       unsure of cause of death  .  Other  (Not Specified)   Family History  Problem Relation Age of Onset  . Heart disease Mother   . Cancer Mother 46       breast  . Heart disease Father   . COPD Brother   . Stroke Brother   . Diabetes Other        1 and 2   Allergies  Allergen Reactions  . Doxycycline Other (See Comments)    Reaction: per [facility] patient called on 11/30/14 stating he was having a reaction to a medication possibly including redness, swelling and itching, he was not sure at that time if it was this medication or Hydrocodone-homatropin.   Marland Kitchen Hydrocodone-Homatropine Other (See Comments)    Reaction:  Reaction: per [facility] patient called on 11/30/14 stating he was having a reaction to a medication possibly including redness, swelling and itching, he was not sure at that time if it was this medication or Doxy that he was taking at the same time. Patient has been taking Hydrocodone with no problems.      Medications: Outpatient Medications Prior to Visit  Medication Sig  . ACCU-CHEK SMARTVIEW test strip CHECK SUGAR THREE TIMES DAILY  . Alcohol Swabs (B-D SINGLE USE SWABS REGULAR) PADS USE AS DIRECTED  WITH  EACH  FINGERSTICK  . aspirin EC 81 MG tablet Take 81 mg by mouth daily.   Marland Kitchen atorvastatin (LIPITOR) 40 MG tablet TAKE 1 TABLET BY MOUTH EVERYDAY AT BEDTIME  . b complex vitamins tablet Take 1 tablet by mouth daily. (Patient not taking: Reported on 06/18/2020)  . calcium-vitamin D 250-100 MG-UNIT per tablet Take 1 tablet by mouth  daily.  (Patient not taking: Reported on 06/13/2020)  . cholecalciferol (VITAMIN D) 1000 UNITS tablet Take 1,000 Units by mouth daily.  . Coenzyme Q10 (COQ10) 200 MG CAPS Take 100 mg by mouth daily.   . Continuous Blood Gluc Sensor (DEXCOM G4 SENSOR) MISC by Does not apply route.  . diphenhydrAMINE (BENADRYL) 25 MG tablet Take 25 mg by mouth every 6 (six) hours as needed. (Patient not taking: Reported on 06/18/2020)  . diphenoxylate-atropine (LOMOTIL) 2.5-0.025 MG per tablet Take 1 tablet by mouth 4 (four) times daily as needed for diarrhea or loose stools. (Patient not taking: Reported on 06/18/2020)  . erythromycin ophthalmic ointment Place a 1/2 inch ribbon of ointment into the lower eyelid.  . fluocinonide (LIDEX) 0.05 % external solution Apply 1 application topically daily. To affected areas of scalp   . HYDROcodone-acetaminophen (NORCO/VICODIN) 5-325 MG tablet Take 1-2 tablets by mouth every 4 (four) hours as needed for moderate pain. (Patient not taking: Reported on 06/18/2020)  . hydrocortisone 2.5 % lotion Apply 1 application topically 2 (two) times daily. To affected areas of face only as needed for flares of itching/scaling   . hydroxypropyl methylcellulose / hypromellose (ISOPTO TEARS / GONIOVISC) 2.5 % ophthalmic solution Place 1 drop into both eyes 4 (four) times daily as needed for dry eyes.  . insulin aspart (NOVOLOG FLEXPEN) 100 UNIT/ML FlexPen Inject 4 Units into the skin 3 (three) times daily with meals. 90 day supply. Generic ok. (Patient taking differently: Inject 7 Units into the skin 3 (three) times daily with meals. 90 day supply. Generic ok.)  . insulin glargine (LANTUS) 100 UNIT/ML injection Inject 10 Units into the skin daily.  Javier Docker Oil 500 MG CAPS Take 1 capsule by mouth daily. [place]  . lipase/protease/amylase (CREON) 36000 UNITS CPEP capsule Take 36,000 Units by mouth.  . Misc Natural Products (OSTEO  BI-FLEX ADV JOINT SHIELD PO) Take by mouth 2 (two) times daily.  .  Multiple Vitamins-Minerals (MULTIVITAMIN PO) Take 1 tablet by mouth daily.  . Multiple Vitamins-Minerals (PRESERVISION AREDS PO) Take by mouth.   . omega-3 acid ethyl esters (LOVAZA) 1 g capsule Take by mouth 2 (two) times daily.   Glory Rosebush VERIO test strip CHECK BLOOD SUGAR 3 TIMES A DAY (DX E11.9)  . oxybutynin (DITROPAN XL) 5 MG 24 hr tablet Take 1 tablet (5 mg total) by mouth at bedtime. (Patient not taking: Reported on 03/13/2020)  . pantoprazole (PROTONIX) 40 MG tablet TAKE 1 TABLET BY MOUTH EVERY DAY  . primidone (MYSOLINE) 50 MG tablet Take 1 tablet (50 mg total) by mouth 4 (four) times daily. (Patient taking differently: Take 50 mg by mouth at bedtime. )  . SODIUM CHLORIDE, GU IRRIGANT, IR Irrigate with as directed. Irrigant every day flush drain once daily with 10 ML saline solution   . ursodiol (ACTIGALL) 300 MG capsule Take 300 mg by mouth 2 (two) times daily.  . vitamin B-12 (CYANOCOBALAMIN) 1000 MCG tablet Take 5,000 mcg by mouth daily.    No facility-administered medications prior to visit.    Review of Systems  Constitutional: Positive for fatigue. Negative for chills and fever.  HENT: Positive for congestion and rhinorrhea.   Respiratory: Positive for cough. Negative for apnea, choking, chest tightness, shortness of breath, wheezing and stridor.     Last CBC Lab Results  Component Value Date   WBC 4.9 07/16/2020   HGB 14.4 07/16/2020   HCT 44.4 07/16/2020   MCV 85 07/16/2020   MCH 27.6 07/16/2020   RDW 14.2 07/16/2020   PLT 217 99/37/1696   Last metabolic panel Lab Results  Component Value Date   GLUCOSE 144 (H) 07/16/2020   NA 138 07/16/2020   K 4.3 07/16/2020   CL 100 07/16/2020   CO2 26 07/16/2020   BUN 13 07/16/2020   CREATININE 0.96 07/16/2020   GFRNONAA 69 07/16/2020   GFRAA 80 07/16/2020   CALCIUM 9.5 07/16/2020   PROT 7.1 07/16/2020   ALBUMIN 4.5 07/16/2020   LABGLOB 2.6 07/16/2020   AGRATIO 1.7 07/16/2020   BILITOT 0.4 07/16/2020   ALKPHOS 93  07/16/2020   AST 13 07/16/2020   ALT 12 07/16/2020   ANIONGAP 10 10/17/2019    no vitals avalible for today's visit. Patient denies any fever or fever reducing medications.   Objective    There were no vitals taken for this visit. BP Readings from Last 3 Encounters:  08/09/20 (!) 154/76  07/11/20 126/75  03/13/20 113/69   Wt Readings from Last 3 Encounters:  08/09/20 151 lb (68.5 kg)  07/11/20 150 lb (68 kg)  03/13/20 157 lb (71.2 kg)      Patient is alert and oriented and responsive to questions Engages in conversation with provider. Speaks in full sentences without any pauses without any shortness of breath or distress.    Assessment & Plan      1. Viral upper respiratory tract infection - COVID-19, Flu A+B and RSV  Mucinex per package direction.  Strict return precautions discussed.  He will come for parking lot testing today, possibly tested too early. Will rule out covid given symptoms.    2. Vertigo He is doing exercises and increasing hydration.  Denies any falls or neurological deficits, he dfeels well he reports. Advised to follow care advice and follow up if not resolving at anytime.  3. Gastroesophageal reflux disease, unspecified whether  esophagitis present  Patient was started back on omeprazole 20mg  daily po, he reports he is doing much better on this.  He does have a history of pancreatic cancer[facility] and see HPI.   Red Flags discussed. The patient was given clear instructions to go to ER or return to medical center if any red flags develop, symptoms do not improve, worsen or new problems develop. They verbalized understanding.   Return in about 4 days (around 12/02/2020), or if symptoms worsen or fail to improve, for at any time for any worsening symptoms, Go to Emergency room/ urgent care if worse.   I discussed the limitations of evaluation and management by telemedicine and the availability of in person  appointments. The patient expressed understanding and agreed to proceed.  I discussed the assessment and treatment plan with the patient. The patient was provided an opportunity to ask questions and all were answered. The patient agreed with the plan and demonstrated an understanding of the instructions.   The patient was advised to call back or seek an in-person evaluation if the symptoms worsen or if the condition fails to improve as anticipated.  I provided 30  minutes of non-face-to-face time during this encounter. The entirety of the information documented in the History of Present Illness, Review of Systems and Physical Exam were personally obtained by me. Portions of this information were initially documented by the CMA and reviewed by me for thoroughness and accuracy.   Marcille Buffy, [place] [phone] (phone) [phone] (fax)  [place]

## 2020-11-28 NOTE — Patient Instructions (Signed)
Guaifenesin oral ER tablets What is this medicine? GUAIFENESIN (gwye FEN e sin) is an expectorant. It helps to thin mucous and make coughs more productive. This medicine is used to treat coughs caused by colds or the flu. It is not intended to treat chronic cough caused by smoking, asthma, emphysema, or heart failure. This medicine may be used for other purposes; ask your health care provider or pharmacist if you have questions. COMMON BRAND NAME(S): Humibid, Mucinex What should I tell my health care provider before I take this medicine? They need to know if you have any of these conditions:  fever  kidney disease  an unusual or allergic reaction to guaifenesin, other medicines, foods, dyes, or preservatives  pregnant or trying to get pregnant  breast-feeding How should I use this medicine? Take this medicine by mouth with a full glass of water. Follow the directions on the prescription label. Do not break, chew or crush this medicine. You may take with food or on an empty stomach. Take your medicine at regular intervals. Do not take your medicine more often than directed. Talk to your pediatrician regarding the use of this medicine in children. While this drug may be prescribed for children as young as 80 years old for selected conditions, precautions do apply. Overdosage: If you think you have taken too much of this medicine contact a poison control center or emergency room at once. NOTE: This medicine is only for you. Do not share this medicine with others. What if I miss a dose? If you miss a dose, take it as soon as you can. If it is almost time for your next dose, take only that dose. Do not take double or extra doses. What may interact with this medicine? Interactions are not expected. This list may not describe all possible interactions. Give your health care provider a list of all the medicines, herbs, non-prescription drugs, or dietary supplements you use. Also tell them if you  smoke, drink alcohol, or use illegal drugs. Some items may interact with your medicine. What should I watch for while using this medicine? Do not treat a cough for more than 1 week without consulting your doctor or health care professional. If you also have a high fever, skin rash, continuing headache, or sore throat, see your doctor. For best results, drink 6 to 8 glasses water daily while you are taking this medicine. What side effects may I notice from receiving this medicine? Side effects that you should report to your doctor or health care professional as soon as possible:  allergic reactions like skin rash, itching or hives, swelling of the face, lips, or tongue Side effects that usually do not require medical attention (report to your doctor or health care professional if they continue or are bothersome):  dizziness  headache  stomach upset This list may not describe all possible side effects. Call your doctor for medical advice about side effects. You may report side effects to FDA at 1-800-FDA-1088. Where should I keep my medicine? Keep out of the reach of children. Store at room temperature between 20 and 25 degrees C (68 and 77 degrees F). Keep container tightly closed. Throw away any unused medicine after the expiration date. NOTE: This sheet is a summary. It may not cover all possible information. If you have questions about this medicine, talk to your doctor, pharmacist, or health care provider.  2021 Elsevier/Gold Standard (2008-02-02 12:14:14) Upper Respiratory Infection, Adult An upper respiratory infection (URI) affects the nose, throat,  and upper air passages. URIs are caused by germs (viruses). The most common type of URI is often called "the common cold." Medicines cannot cure URIs, but you can do things at home to relieve your symptoms. URIs usually get better within 7-10 days. Follow these instructions at home: Activity  Rest as needed.  If you have a fever, stay  home from work or school until your fever is gone, or until your doctor says you may return to work or school. ? You should stay home until you cannot spread the infection anymore (you are not contagious). ? Your doctor may have you wear a face mask so you have less risk of spreading the infection. Relieving symptoms  Gargle with a salt-water mixture 3-4 times a day or as needed. To make a salt-water mixture, completely dissolve -1 tsp of salt in 1 cup of warm water.  Use a cool-mist humidifier to add moisture to the air. This can help you breathe more easily. Eating and drinking  Drink enough fluid to keep your pee (urine) pale yellow.  Eat soups and other clear broths.   General instructions  Take over-the-counter and prescription medicines only as told by your doctor. These include cold medicines, fever reducers, and cough suppressants.  Do not use any products that contain nicotine or tobacco. These include cigarettes and e-cigarettes. If you need help quitting, ask your doctor.  Avoid being where people are smoking (avoid secondhand smoke).  Make sure you get regular shots and get the flu shot every year.  Keep all follow-up visits as told by your doctor. This is important.   How to avoid spreading infection to others  Wash your hands often with soap and water. If you do not have soap and water, use hand sanitizer.  Avoid touching your mouth, face, eyes, or nose.  Cough or sneeze into a tissue or your sleeve or elbow. Do not cough or sneeze into your hand or into the air.   Contact a doctor if:  You are getting worse, not better.  You have any of these: ? A fever. ? Chills. ? Brown or red mucus in your nose. ? Yellow or brown fluid (discharge)coming from your nose. ? Pain in your face, especially when you bend forward. ? Swollen neck glands. ? Pain with swallowing. ? White areas in the back of your throat. Get help right away if:  You have shortness of breath that  gets worse.  You have very bad or constant: ? Headache. ? Ear pain. ? Pain in your forehead, behind your eyes, and over your cheekbones (sinus pain). ? Chest pain.  You have long-lasting (chronic) lung disease along with any of these: ? Wheezing. ? Long-lasting cough. ? Coughing up blood. ? A change in your usual mucus.  You have a stiff neck.  You have changes in your: ? Vision. ? Hearing. ? Thinking. ? Mood. Summary  An upper respiratory infection (URI) is caused by a germ called a virus. The most common type of URI is often called "the common cold."  URIs usually get better within 7-10 days.  Take over-the-counter and prescription medicines only as told by your doctor. This information is not intended to replace advice given to you by your health care provider. Make sure you discuss any questions you have with your health care provider. Document Revised: 05/31/2020 Document Reviewed: 05/31/2020 Elsevier Patient Education  [place].

## 2020-11-29 ENCOUNTER — Telehealth: Payer: Self-pay

## 2020-11-29 NOTE — Progress Notes (Signed)
Chronic Care Management Pharmacy Assistant   Name: Karlon Schlafer  MRN: [id number] DOB: [birth year]  Reason for Encounter: Medication Review/General Adherence call.  PCP : Jerrol Banana., MD  Allergies:   Allergies  Allergen Reactions  . Doxycycline Other (See Comments)    Reaction: per [facility] patient called on 11/30/14 stating he was having a reaction to a medication possibly including redness, swelling and itching, he was not sure at that time if it was this medication or Hydrocodone-homatropin.   Marland Kitchen Hydrocodone-Homatropine Other (See Comments)    Reaction:  Reaction: per [facility] patient called on 11/30/14 stating he was having a reaction to a medication possibly including redness, swelling and itching, he was not sure at that time if it was this medication or Doxy that he was taking at the same time. Patient has been taking Hydrocodone with no problems.    Medications: Outpatient Encounter Medications as of 11/29/2020  Medication Sig Note  . ACCU-CHEK SMARTVIEW test strip CHECK SUGAR THREE TIMES DAILY   . Alcohol Swabs (B-D SINGLE USE SWABS REGULAR) PADS USE AS DIRECTED  WITH  EACH  FINGERSTICK   . aspirin EC 81 MG tablet Take 81 mg by mouth daily.    Marland Kitchen atorvastatin (LIPITOR) 40 MG tablet TAKE 1 TABLET BY MOUTH EVERYDAY AT BEDTIME   . b complex vitamins tablet Take 1 tablet by mouth daily. (Patient not taking: Reported on 06/18/2020)   . calcium-vitamin D 250-100 MG-UNIT per tablet Take 1 tablet by mouth daily.  (Patient not taking: Reported on 06/13/2020)   . cholecalciferol (VITAMIN D) 1000 UNITS tablet Take 1,000 Units by mouth daily.   . Coenzyme Q10 (COQ10) 200 MG CAPS Take 100 mg by mouth daily.    . Continuous Blood Gluc Sensor (DEXCOM G4 SENSOR) MISC by Does not apply route.   . diphenhydrAMINE (BENADRYL) 25 MG tablet Take 25 mg by mouth every 6 (six) hours as needed. (Patient not taking: Reported on 06/18/2020) 11/10/2016: prn  . diphenoxylate-atropine (LOMOTIL)  2.5-0.025 MG per tablet Take 1 tablet by mouth 4 (four) times daily as needed for diarrhea or loose stools. (Patient not taking: Reported on 06/18/2020)   . erythromycin ophthalmic ointment Place a 1/2 inch ribbon of ointment into the lower eyelid.   . fluocinonide (LIDEX) 0.05 % external solution Apply 1 application topically daily. To affected areas of scalp    . HYDROcodone-acetaminophen (NORCO/VICODIN) 5-325 MG tablet Take 1-2 tablets by mouth every 4 (four) hours as needed for moderate pain. (Patient not taking: Reported on 06/18/2020)   . hydrocortisone 2.5 % lotion Apply 1 application topically 2 (two) times daily. To affected areas of face only as needed for flares of itching/scaling    . hydroxypropyl methylcellulose / hypromellose (ISOPTO TEARS / GONIOVISC) 2.5 % ophthalmic solution Place 1 drop into both eyes 4 (four) times daily as needed for dry eyes.   . insulin aspart (NOVOLOG FLEXPEN) 100 UNIT/ML FlexPen Inject 4 Units into the skin 3 (three) times daily with meals. 90 day supply. Generic ok. (Patient taking differently: Inject 7 Units into the skin 3 (three) times daily with meals. 90 day supply. Generic ok.) 06/13/2020: 5 units at breakfast, 3 units at lunch and 5 units in PM  . insulin glargine (LANTUS) 100 UNIT/ML injection Inject 10 Units into the skin daily. 06/13/2020: At bedtime  . Krill Oil 500 MG CAPS Take 1 capsule by mouth daily. [place]   . lipase/protease/amylase (CREON) 36000 UNITS CPEP capsule Take 36,000  Units by mouth.   . Misc Natural Products (OSTEO BI-FLEX ADV JOINT SHIELD PO) Take by mouth 2 (two) times daily.   . Multiple Vitamins-Minerals (MULTIVITAMIN PO) Take 1 tablet by mouth daily.   . Multiple Vitamins-Minerals (PRESERVISION AREDS PO) Take by mouth.    . omega-3 acid ethyl esters (LOVAZA) 1 g capsule Take by mouth 2 (two) times daily.    Glory Rosebush VERIO test strip CHECK BLOOD SUGAR 3 TIMES A DAY (DX E11.9)   . oxybutynin (DITROPAN XL) 5 MG 24 hr tablet Take 1  tablet (5 mg total) by mouth at bedtime. (Patient not taking: Reported on 03/13/2020)   . pantoprazole (PROTONIX) 40 MG tablet TAKE 1 TABLET BY MOUTH EVERY DAY   . primidone (MYSOLINE) 50 MG tablet Take 1 tablet (50 mg total) by mouth 4 (four) times daily. (Patient taking differently: Take 50 mg by mouth at bedtime. )   . SODIUM CHLORIDE, GU IRRIGANT, IR Irrigate with as directed. Irrigant every day flush drain once daily with 10 ML saline solution    . ursodiol (ACTIGALL) 300 MG capsule Take 300 mg by mouth 2 (two) times daily.   . vitamin B-12 (CYANOCOBALAMIN) 1000 MCG tablet Take 5,000 mcg by mouth daily.     No facility-administered encounter medications on file as of 11/29/2020.    Current Diagnosis: Patient Active Problem List   Diagnosis Date Noted  . Viral upper respiratory tract infection 11/28/2020  . Chest pain 04/27/2015  . Allergic drug reaction 02/08/2015  . B12 deficiency 02/08/2015  . 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 Chest discomfort 02/08/2015  . Diabetes ([place]) 02/08/2015  . Vertigo 02/08/2015  . Benign essential tremor 02/08/2015  . Fatigue 02/08/2015  . Acid reflux 02/08/2015  . HLD (hyperlipidemia) 02/08/2015  . BP (high blood pressure) 02/08/2015  . Hyperthyroidism 02/08/2015  . Hernia, inguinal, left 02/08/2015  . Cannot sleep 02/08/2015  . Cancer of pancreas ([place]) 02/08/2015  . Pancreatitis 02/08/2015  . CA of prostate ([place]) 02/08/2015    Goals Addressed   None    Called patient and discussed medication adherence  with patient, no issues at this time with current medication.   Patient denies ED visit since his last CPP follow up.  Patient denies any side effects with his medication. Patient denies any problems with his current pharmacy  Patient states he does not feel well symptoms consist of congestion , head cold with a headache. Patient states he is waiting on the result from being testing for covid on 11/28/2020.  Performed cost analysis  for patient, estimated yearly medication cost of $0.00.  Follow-Up:  Pharmacist Review   Bessie Homewood Pharmacist Assistant [phone]

## 2020-11-30 ENCOUNTER — Telehealth: Payer: Self-pay

## 2020-11-30 LAB — COVID-19, FLU A+B AND RSV
Influenza A, NAA: NOT DETECTED
Influenza B, NAA: NOT DETECTED
RSV, NAA: NOT DETECTED
SARS-CoV-2, NAA: NOT DETECTED

## 2020-11-30 NOTE — Telephone Encounter (Signed)
Pt advised.   Thanks,   -Laura  

## 2020-11-30 NOTE — Progress Notes (Signed)
Negative for flu A and B,  covid, and RSV.

## 2020-11-30 NOTE — Telephone Encounter (Signed)
Covid, flu and rsv testing is all negative.

## 2020-11-30 NOTE — Telephone Encounter (Signed)
Copied from [place] [id number]. Topic: Quick Communication - Other Results (Clinic Use ONLY) >> Nov 30, 2020  1:19 PM Lennox Solders wrote: Pt is calling and would like his covid test results from yesterday

## 2020-11-30 NOTE — Telephone Encounter (Signed)
Advised pt that the results have not come back yet.  I told him I would check one more time before we leave for the day.   Thanks,   -Mickel Baas

## 2020-12-07 NOTE — Progress Notes (Signed)
Established patient visit   Patient: Anthony Craig   DOB: [birth year]   87 y.o. Male  MRN: [id number] Visit Date: 12/10/2020  Today's healthcare provider: Wilhemena Durie, MD   Chief Complaint  Patient presents with  . Diabetes   Subjective    HPI  Patient also followed at the [facility] for routine medical problems.  Endocrinology has given him a Dexcom continuous glucose monitor and that is helping him.  It alarms if it goes below 85 for his routine blood sugar His endocrinologist also switched him from Lantus and NovoLog to NovoLog 70/30 at 12 units with breakfast and 6 units with supper.  This seems to be working well. He did have all 3 Covid shots and had the first Shingrix vaccine last week.  His arm area under the shot is warm to the touch but not tender. His only issue today is that his wife is developing some dementia which is slowly getting worse. He has some chronic back problems with which limit his ability to play golf now. Diabetes Mellitus Type II, follow-up  Lab Results  Component Value Date   HGBA1C 8.2 (A) 12/10/2020   HGBA1C 8.4 (H) 07/16/2020   HGBA1C 7.6 (A) 03/13/2020   Last seen for diabetes 5 months ago.  Management since then includes continuing the same treatment. He reports good compliance with treatment. He is not having side effects.   Home blood sugar records: fasting range: 180s  Episodes of hypoglycemia? No    Current insulin regiment: Patient takes 12 units of 70/30 in the am and 6 units in the pm.  Most Recent Eye Exam: up to date.        Medications: Outpatient Medications Prior to Visit  Medication Sig  . ACCU-CHEK SMARTVIEW test strip CHECK SUGAR THREE TIMES DAILY  . Alcohol Swabs (B-D SINGLE USE SWABS REGULAR) PADS USE AS DIRECTED  WITH  EACH  FINGERSTICK  . aspirin EC 81 MG tablet Take 81 mg by mouth daily.  Marland Kitchen atorvastatin (LIPITOR) 40 MG tablet TAKE 1 TABLET BY MOUTH EVERYDAY AT BEDTIME  . b complex vitamins tablet Take  1 tablet by mouth daily.  . cholecalciferol (VITAMIN D) 1000 UNITS tablet Take 1,000 Units by mouth daily.  . Coenzyme Q10 (COQ10) 200 MG CAPS Take 100 mg by mouth daily.   . Continuous Blood Gluc Sensor (DEXCOM G4 SENSOR) MISC by Does not apply route.  . diphenoxylate-atropine (LOMOTIL) 2.5-0.025 MG per tablet Take 1 tablet by mouth 4 (four) times daily as needed for diarrhea or loose stools.  . hydrocortisone 2.5 % lotion Apply 1 application topically 2 (two) times daily. To affected areas of face only as needed for flares of itching/scaling   . hydroxypropyl methylcellulose / hypromellose (ISOPTO TEARS / GONIOVISC) 2.5 % ophthalmic solution Place 1 drop into both eyes 4 (four) times daily as needed for dry eyes.  . insulin aspart protamine - aspart (NOVOLOG MIX 70/30 FLEXPEN) (70-30) 100 UNIT/ML FlexPen 12 units in the am and 6 units after dinner.  Javier Docker Oil 500 MG CAPS Take 1 capsule by mouth daily. [place]  . lipase/protease/amylase (CREON) 36000 UNITS CPEP capsule Take 36,000 Units by mouth.  . Misc Natural Products (OSTEO BI-FLEX ADV JOINT SHIELD PO) Take by mouth 2 (two) times daily.  . Multiple Vitamins-Minerals (MULTIVITAMIN PO) Take 1 tablet by mouth daily.  . Multiple Vitamins-Minerals (PRESERVISION AREDS PO) Take by mouth.   . omega-3 acid ethyl esters (LOVAZA) 1 g  capsule Take by mouth 2 (two) times daily.   Glory Rosebush VERIO test strip CHECK BLOOD SUGAR 3 TIMES A DAY (DX E11.9)  . pantoprazole (PROTONIX) 40 MG tablet TAKE 1 TABLET BY MOUTH EVERY DAY  . ursodiol (ACTIGALL) 300 MG capsule Take 300 mg by mouth 2 (two) times daily.  . vitamin B-12 (CYANOCOBALAMIN) 1000 MCG tablet Take 5,000 mcg by mouth daily.   . calcium-vitamin D 250-100 MG-UNIT per tablet Take 1 tablet by mouth daily.  . diphenhydrAMINE (BENADRYL) 25 MG tablet Take 25 mg by mouth every 6 (six) hours as needed.  Marland Kitchen erythromycin ophthalmic ointment Place a 1/2 inch ribbon of ointment into the lower eyelid.  .  fluocinonide (LIDEX) 0.05 % external solution Apply 1 application topically daily. To affected areas of scalp   . HYDROcodone-acetaminophen (NORCO/VICODIN) 5-325 MG tablet Take 1-2 tablets by mouth every 4 (four) hours as needed for moderate pain. (Patient not taking: No sig reported)  . insulin aspart (NOVOLOG FLEXPEN) 100 UNIT/ML FlexPen Inject 4 Units into the skin 3 (three) times daily with meals. 90 day supply. Generic ok. (Patient taking differently: Inject 7 Units into the skin 3 (three) times daily with meals. 90 day supply. Generic ok.)  . insulin glargine (LANTUS) 100 UNIT/ML injection Inject 10 Units into the skin daily. (Patient not taking: Reported on 12/10/2020)  . oxybutynin (DITROPAN XL) 5 MG 24 hr tablet Take 1 tablet (5 mg total) by mouth at bedtime.  . primidone (MYSOLINE) 50 MG tablet Take 1 tablet (50 mg total) by mouth 4 (four) times daily. (Patient taking differently: Take 50 mg by mouth at bedtime.)  . SODIUM CHLORIDE, GU IRRIGANT, IR Irrigate with as directed. Irrigant every day flush drain once daily with 10 ML saline solution    No facility-administered medications prior to visit.    Review of Systems  Constitutional: Negative for appetite change, chills and fever.  Respiratory: Negative for chest tightness, shortness of breath and wheezing.   Cardiovascular: Negative for chest pain and palpitations.  Gastrointestinal: Negative for abdominal pain, nausea and vomiting.    Last hemoglobin A1c Lab Results  Component Value Date   HGBA1C 8.2 (A) 12/10/2020       Objective    BP 95/69   Pulse 74   Temp 97.9 F (36.6 C)   Resp 16   Ht 5' 10.5" (1.791 m)   Wt 145 lb (65.8 kg)   BMI 20.51 kg/m  BP Readings from Last 3 Encounters:  12/10/20 95/69  08/09/20 (!) 154/76  07/11/20 126/75   Wt Readings from Last 3 Encounters:  12/10/20 145 lb (65.8 kg)  08/09/20 151 lb (68.5 kg)  07/11/20 150 lb (68 kg)       Physical Exam Vitals reviewed.  Constitutional:       Comments: Cachectic white male in no acute distress.  HENT:     Head: Normocephalic and atraumatic.     Right Ear: External ear normal.     Left Ear: External ear normal.  Eyes:     General: No scleral icterus.    Conjunctiva/sclera: Conjunctivae normal.  Cardiovascular:     Rate and Rhythm: Normal rate and regular rhythm.     Heart sounds: Normal heart sounds.  Pulmonary:     Effort: Pulmonary effort is normal.     Breath sounds: Normal breath sounds.  Abdominal:     Palpations: Abdomen is soft.     Tenderness: There is no abdominal tenderness.  Musculoskeletal:  Right lower leg: No edema.     Left lower leg: No edema.  Skin:    General: Skin is warm and dry.  Neurological:     Mental Status: He is alert and oriented to person, place, and time. Mental status is at baseline.  Psychiatric:        Mood and Affect: Mood normal.        Behavior: Behavior normal.        Thought Content: Thought content normal.        Judgment: Judgment normal.       Results for orders placed or performed in visit on 12/10/20  POCT glycosylated hemoglobin (Hb A1C)  Result Value Ref Range   Hemoglobin A1C 8.2 (A) 4.0 - 5.6 %   HbA1c POC (<> result, manual entry)     HbA1c, POC (prediabetic range)     HbA1c, POC (controlled diabetic range)      Assessment & Plan     1. Type 2 diabetes mellitus without complication, without long-term current use of insulin (HCC) A1c of 8.2.  Patient has continuous glucose monitor and has endocrine follow-up next week at the [place]. On atorvastatin due to diabetes and CAD - POCT glycosylated hemoglobin (Hb A1C).  2. Cervical nerve root disorder   3. CA of prostate ([place]) History of prostate cancer, no PSA on follow-up  4. Chronic bilateral low back pain without sciatica Consider turmeric daily.  Also continue regular activity and consider yoga or tai chi  5. Malignant neoplasm of head of pancreas [facility]) Patient status post Whipple procedure 14 years  ago now   No follow-ups on file.      I, Wilhemena Durie, MD, have reviewed all documentation for this visit. The documentation on 12/10/20 for the exam, diagnosis, procedures, and orders are all accurate and complete.    Bertice Risse Cranford Mon, MD  [facility] [phone] (phone) [phone] (fax)  [place]

## 2020-12-10 ENCOUNTER — Other Ambulatory Visit: Payer: Self-pay

## 2020-12-10 ENCOUNTER — Ambulatory Visit: Payer: Medicare PPO | Admitting: Family Medicine

## 2020-12-10 ENCOUNTER — Encounter: Payer: Self-pay | Admitting: Family Medicine

## 2020-12-10 VITALS — BP 95/69 | HR 74 | Temp 97.9°F | Resp 16 | Ht 70.5 in | Wt 145.0 lb

## 2020-12-10 DIAGNOSIS — G8929 Other chronic pain: Secondary | ICD-10-CM

## 2020-12-10 DIAGNOSIS — E119 Type 2 diabetes mellitus without complications: Secondary | ICD-10-CM

## 2020-12-10 DIAGNOSIS — M545 Low back pain, unspecified: Secondary | ICD-10-CM

## 2020-12-10 DIAGNOSIS — C61 Malignant neoplasm of prostate: Secondary | ICD-10-CM

## 2020-12-10 DIAGNOSIS — C250 Malignant neoplasm of head of pancreas: Secondary | ICD-10-CM

## 2020-12-10 DIAGNOSIS — M5412 Radiculopathy, cervical region: Secondary | ICD-10-CM | POA: Diagnosis not present

## 2020-12-10 LAB — POCT GLYCOSYLATED HEMOGLOBIN (HGB A1C): Hemoglobin A1C: 8.2 % — AB (ref 4.0–5.6)

## 2020-12-10 NOTE — Patient Instructions (Signed)
Try Tumeric over the counter daily.

## 2021-02-18 LAB — MICROALBUMIN, URINE: Microalb, Ur: 0.782

## 2021-02-18 LAB — MICROALBUMIN / CREATININE URINE RATIO: Microalb Creat Ratio: 8.9

## 2021-03-08 ENCOUNTER — Telehealth: Payer: Self-pay

## 2021-03-08 NOTE — Progress Notes (Signed)
Chronic Care Management Pharmacy Assistant   Name: Anthony Craig  MRN: [id number] DOB: September 18, 1928  Reason for Encounter: Medication Review /General Adherence Call.   Recent office visits:  12/10/2020 Dr.Gilbert MD (PCP)   Recent consult visits:  12/07/2020 Grier Rocher [facility] and [facility] [employer] joint HIE)  Hospital visits:  None in previous 6 months  Medications: Outpatient Encounter Medications as of 03/08/2021  Medication Sig Note   ACCU-CHEK SMARTVIEW test strip CHECK SUGAR THREE TIMES DAILY    Alcohol Swabs (B-D SINGLE USE SWABS REGULAR) PADS USE AS DIRECTED  WITH  EACH  FINGERSTICK    aspirin EC 81 MG tablet Take 81 mg by mouth daily.    atorvastatin (LIPITOR) 40 MG tablet TAKE 1 TABLET BY MOUTH EVERYDAY AT BEDTIME    b complex vitamins tablet Take 1 tablet by mouth daily.    calcium-vitamin D 250-100 MG-UNIT per tablet Take 1 tablet by mouth daily.    cholecalciferol (VITAMIN D) 1000 UNITS tablet Take 1,000 Units by mouth daily.    Coenzyme Q10 (COQ10) 200 MG CAPS Take 100 mg by mouth daily.     Continuous Blood Gluc Sensor (DEXCOM G4 SENSOR) MISC by Does not apply route.    diphenhydrAMINE (BENADRYL) 25 MG tablet Take 25 mg by mouth every 6 (six) hours as needed. 11/10/2016: prn   diphenoxylate-atropine (LOMOTIL) 2.5-0.025 MG per tablet Take 1 tablet by mouth 4 (four) times daily as needed for diarrhea or loose stools.    erythromycin ophthalmic ointment Place a 1/2 inch ribbon of ointment into the lower eyelid.    fluocinonide (LIDEX) 0.05 % external solution Apply 1 application topically daily. To affected areas of scalp     HYDROcodone-acetaminophen (NORCO/VICODIN) 5-325 MG tablet Take 1-2 tablets by mouth every 4 (four) hours as needed for moderate pain. (Patient not taking: No sig reported)    hydrocortisone 2.5 % lotion Apply 1 application topically 2 (two) times daily. To affected areas of face only as needed for flares of itching/scaling      hydroxypropyl methylcellulose / hypromellose (ISOPTO TEARS / GONIOVISC) 2.5 % ophthalmic solution Place 1 drop into both eyes 4 (four) times daily as needed for dry eyes.    insulin aspart (NOVOLOG FLEXPEN) 100 UNIT/ML FlexPen Inject 4 Units into the skin 3 (three) times daily with meals. 90 day supply. Generic ok. (Patient taking differently: Inject 7 Units into the skin 3 (three) times daily with meals. 90 day supply. Generic ok.) 06/13/2020: 5 units at breakfast, 3 units at lunch and 5 units in PM   insulin aspart protamine - aspart (NOVOLOG MIX 70/30 FLEXPEN) (70-30) 100 UNIT/ML FlexPen 12 units in the am and 6 units after dinner.    insulin glargine (LANTUS) 100 UNIT/ML injection Inject 10 Units into the skin daily. (Patient not taking: Reported on 12/10/2020) 06/13/2020: At bedtime   Krill Oil 500 MG CAPS Take 1 capsule by mouth daily. [place]    lipase/protease/amylase (CREON) 36000 UNITS CPEP capsule Take 36,000 Units by mouth.    Misc Natural Products (OSTEO BI-FLEX ADV JOINT SHIELD PO) Take by mouth 2 (two) times daily.    Multiple Vitamins-Minerals (MULTIVITAMIN PO) Take 1 tablet by mouth daily.    Multiple Vitamins-Minerals (PRESERVISION AREDS PO) Take by mouth.     omega-3 acid ethyl esters (LOVAZA) 1 g capsule Take by mouth 2 (two) times daily.     ONETOUCH VERIO test strip CHECK BLOOD SUGAR 3 TIMES A DAY (DX E11.9)  oxybutynin (DITROPAN XL) 5 MG 24 hr tablet Take 1 tablet (5 mg total) by mouth at bedtime.    pantoprazole (PROTONIX) 40 MG tablet TAKE 1 TABLET BY MOUTH EVERY DAY    primidone (MYSOLINE) 50 MG tablet Take 1 tablet (50 mg total) by mouth 4 (four) times daily. (Patient taking differently: Take 50 mg by mouth at bedtime.)    SODIUM CHLORIDE, GU IRRIGANT, IR Irrigate with as directed. Irrigant every day flush drain once daily with 10 ML saline solution     ursodiol (ACTIGALL) 300 MG capsule Take 300 mg by mouth 2 (two) times daily.    vitamin B-12 (CYANOCOBALAMIN) 1000 MCG  tablet Take 5,000 mcg by mouth daily.     No facility-administered encounter medications on file as of 03/08/2021.   Star Rating Drugs: Atorvastatin 40 mg last filled on 10/11/[facility]  Called patient and discussed medication adherence  with patient, no issues at this time with current medication.   Patient denies ED visit since his last CPP follow up.  Patient denies any side effects with his medication. Patient denies any problems with his current pharmacy   Adherence Issue for Atorvastatin 40 mg: Patient states he cuts his Atorvastatin in half at bedtime.Patient states he is unsure why he is taking Atorvastatin.Patient reports his oncologist prescribe it relating to his diabetes, and he has not had any lab work recently.   Schedule a telephone follow up with the clinical pharmacist on 04/30/2021 at 4:00 pm.Sent Message to scheduler.  [place] Pharmacist Assistant [phone]

## 2021-04-29 ENCOUNTER — Telehealth: Payer: Self-pay

## 2021-04-29 NOTE — Progress Notes (Signed)
    Chronic Care Management Pharmacy Assistant   Name: Anthony Craig  MRN: [id number] DOB: 02-28-1928  Patient called to remind of appointment with [name], CPP on 04/30/2021 @ 1600 for a telephone visit.  Patient aware of appointment date, time, and type of appointment (either telephone or in person). Patient aware to have/bring all medications, supplements, blood pressure and/or blood sugar logs to visit.  Questions: Have you had any recent office visit or specialist visit outside of [place]? Patient reports that he see's providers at the [facility] hospital. Endo and [employer].   Are there any concerns you would like to discuss during your office visit? 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 He stated that he has contacted the pain clinic to call him back with the [place].   Are you having any problems obtaining your medications? Everything is okay    Star Rating Drug: Atorvastatin 40 mg last filled on 10/11/2019 [facility] (patient also does go to the [place] so it's possible he is getting his prescriptions from there pharmacy)  Any gaps in medications fill history? Yes  Care Gaps: FOOT EXAM URINE MICROALBUMIN COVID-19 Vaccine Booster 4 Zoster Vaccines- Shingrix  (1st one given on 12/04/2020) OPHTHALMOLOGY EXAM (last completed 04/24/2020)   Lynann Bologna, CPA/CMA Clinical Pharmacist Assistant Phone: [phone]

## 2021-04-30 ENCOUNTER — Ambulatory Visit (INDEPENDENT_AMBULATORY_CARE_PROVIDER_SITE_OTHER): Payer: Medicare PPO

## 2021-04-30 DIAGNOSIS — E1169 Type 2 diabetes mellitus with other specified complication: Secondary | ICD-10-CM

## 2021-04-30 DIAGNOSIS — M1990 Unspecified osteoarthritis, unspecified site: Secondary | ICD-10-CM

## 2021-04-30 DIAGNOSIS — E1159 Type 2 diabetes mellitus with other circulatory complications: Secondary | ICD-10-CM | POA: Diagnosis not present

## 2021-04-30 DIAGNOSIS — E785 Hyperlipidemia, unspecified: Secondary | ICD-10-CM

## 2021-04-30 DIAGNOSIS — Z794 Long term (current) use of insulin: Secondary | ICD-10-CM

## 2021-04-30 NOTE — Progress Notes (Signed)
Chronic Care Management Pharmacy Note  04/30/2021 Name:  Anthony Craig MRN:  [id number] DOB:  08-Jan-1928  Summary: Patient presents today for CCM follow-up. HE follows with the [facility] and his endocrinologist has set him up with a Dexcom G6. He has had a few instances of hypoglycemia when he wasn't eating as much as normal.   Recommendations/Changes made from today's visit: Continue current medications  Plan: CPP follow-up in 6 months   Subjective: Anthony Craig is an 87 y.o. year old male who is a primary patient of Anthony Craig., Craig.  The CCM team was consulted for assistance with disease management and care coordination needs.    Engaged with patient by telephone for follow up visit in response to provider referral for pharmacy case management and/or care coordination services.   Consent to Services:  The patient was given information about Chronic Care Management services, agreed to services, and gave verbal consent prior to initiation of services.  Please see initial visit note for detailed documentation.   Patient Care Team: Anthony Craig., Craig as PCP - General (Family Medicine) Anthony Craig as Referring Physician (Family Medicine) Anthony Craig, [facility] (Inactive) (Pharmacist) Anthony Cotta, Craig (Ophthalmology) Anthony Carina Betsey Holiday, Craig as Physician Assistant (Endocrinology)  Recent office visits: 12/10/20: Patient presnted to Dr. Rosanna Randy for follow-up. A1c 8.2%.   Recent consult visits: None in past 6 months  Hospital visits: None in previous 6 months   Objective:  Lab Results  Component Value Date   CREATININE 0.96 07/16/2020   BUN 13 07/16/2020   GFRNONAA 69 07/16/2020   GFRAA 80 07/16/2020   NA 138 07/16/2020   K 4.3 07/16/2020   CALCIUM 9.5 07/16/2020   CO2 26 07/16/2020   GLUCOSE 144 (H) 07/16/2020    Lab Results  Component Value Date/Time   HGBA1C 8.2 (A) 12/10/2020 09:41 AM   HGBA1C 8.4 (H) 07/16/2020 09:15 AM    HGBA1C 7.6 (A) 03/13/2020 02:11 PM   HGBA1C 7.6 (H) 11/23/2019 03:52 PM   HGBA1C 7.4 03/09/2017 12:00 AM   HGBA1C 9.2 11/08/2015 12:00 AM    Last diabetic Eye exam:  Lab Results  Component Value Date/Time   HMDIABEYEEXA No Retinopathy 04/24/2020 12:00 AM    Last diabetic Foot exam: No results found for: HMDIABFOOTEX   Lab Results  Component Value Date   CHOL 116 07/16/2020   HDL 45 07/16/2020   LDLCALC 55 07/16/2020   TRIG 79 07/16/2020   CHOLHDL 2.6 07/16/2020    Hepatic Function Latest Ref Rng & Units 07/16/2020 11/23/2019 10/17/2019  Total Protein 6.0 - 8.5 g/dL 7.1 6.8 7.1  Albumin 3.5 - 4.6 g/dL 4.5 4.4 4.2  AST 0 - 40 IU/L 13 18 95(H)  ALT 0 - 44 IU/L 12 13 42  Alk Phosphatase 44 - 121 IU/L 93 91 93  Total Bilirubin 0.0 - 1.2 mg/dL 0.4 0.3 0.9  Bilirubin, Direct 0.00 - 0.40 mg/dL - - -    Lab Results  Component Value Date/Time   TSH 1.220 08/17/2019 08:15 AM   TSH 0.711 03/15/2015 04:20 PM   FREET4 1.20 03/15/2015 04:20 PM    CBC Latest Ref Rng & Units 07/16/2020 11/23/2019 10/17/2019  WBC 3.4 - 10.8 x10E3/uL 4.9 5.7 10.3  Hemoglobin 13.0 - 17.7 g/dL 14.4 13.9 14.4  Hematocrit 37.5 - 51.0 % 44.4 41.8 44.2  Platelets 150 - 450 x10E3/uL 217 196 207    No results found for: VD25OH  Clinical ASCVD:  Yes  The ASCVD Risk score Mikey Bussing DC Jr., et al., 2013) failed to calculate for the following reasons:   The 2013 ASCVD risk score is only valid for ages 22 to 77   The patient has a prior MI or stroke diagnosis    Depression screen [facility] 2/9 06/13/2020 06/07/2019 02/16/2019  Decreased Interest 0 0 0  Down, Depressed, Hopeless 0 0 0  PHQ - 2 Score 0 0 0  Altered sleeping - - -  Tired, decreased energy - - -  Change in appetite - - -  Feeling bad or failure about yourself  - - -  Trouble concentrating - - -  Moving slowly or fidgety/restless - - -  Suicidal thoughts - - -  PHQ-9 Score - - -  Difficult doing work/chores - - -   Social History   Tobacco Use  Smoking  Status Former   Packs/day: 0.50   Years: 30.00   Pack years: 15.00   Types: Cigarettes   Quit date: 10/06/1979   Years since quitting: 41.5  Smokeless Tobacco Never   BP Readings from Last 3 Encounters:  12/10/20 95/69  08/09/20 (!) 154/76  07/11/20 126/75   Pulse Readings from Last 3 Encounters:  12/10/20 74  08/09/20 66  07/11/20 69   Wt Readings from Last 3 Encounters:  12/10/20 145 lb (65.8 kg)  08/09/20 151 lb (68.5 kg)  07/11/20 150 lb (68 kg)   BMI Readings from Last 3 Encounters:  12/10/20 20.51 kg/m  08/09/20 20.77 kg/m  07/11/20 20.92 kg/m    Assessment/Interventions: Review of patient past medical history, allergies, medications, health status, including review of consultants reports, laboratory and other test data, was performed as part of comprehensive evaluation and provision of chronic care management services.   SDOH:  (Social Determinants of Health) assessments and interventions performed: Yes  SDOH Screenings   Alcohol Screen: Low Risk    Last Alcohol Screening Score (AUDIT): 2  Depression (PHQ2-9): Low Risk    PHQ-2 Score: 0  Financial Resource Strain: Low Risk    Difficulty of Paying Living Expenses: Not hard at all  Food Insecurity: No Food Insecurity   Worried About Charity fundraiser in the Last Year: Never true   Ran Out of Food in the Last Year: Never true  Housing: Low Risk    Last Housing Risk Score: 0  Physical Activity: Sufficiently Active   Days of Exercise per Week: 3 days   Minutes of Exercise per Session: 60 min  Social Connections: Moderately Integrated   Frequency of Communication with Friends and Family: More than three times a week   Frequency of Social Gatherings with Friends and Family: More than three times a week   Attends Religious Services: More than 4 times per year   Active Member of [employer] or Organizations: No   Attends Archivist Meetings: Never   Marital Status: Married  Stress: No Stress Concern  Present   Feeling of Stress : Not at all  Tobacco Use: Medium Risk   Smoking Tobacco Use: Former   Smokeless Tobacco Use: Never  Transportation Needs: No Data processing manager (Medical): No   Lack of Transportation (Non-Medical): No    CCM Care Plan  Allergies  Allergen Reactions   Doxycycline Other (See Comments)    Reaction: per [place] patient called on 11/30/14 stating he was having a reaction to a medication possibly including redness, swelling and itching, he was not sure at  that time if it was this medication or Hydrocodone-homatropin.    Hydrocodone Bit-Homatrop Mbr Other (See Comments)    Reaction:  Reaction: per [place] patient called on 11/30/14 stating he was having a reaction to a medication possibly including redness, swelling and itching, he was not sure at that time if it was this medication or Doxy that he was taking at the same time. Patient has been taking Hydrocodone with no problems.    Medications Reviewed Today     Reviewed by Wilder Glade, CMA (Certified Medical Assistant) on 12/10/20 at [phone]  Med List Status: <None>   Medication Order Taking? Sig Documenting Provider Last Dose Status Informant  ACCU-CHEK SMARTVIEW test strip [id number] Yes CHECK SUGAR THREE TIMES DAILY Chrismon, Vickki Muff, PA-C Taking Active   Alcohol Swabs (B-D SINGLE USE SWABS REGULAR) PADS [id number] Yes USE AS DIRECTED  WITH  EACH  FINGERSTICK Anthony Craig., Craig Taking Active   aspirin EC 81 MG tablet [id number] Yes Take 81 mg by mouth daily. Provider, Historical, Craig Taking Active Self  atorvastatin (LIPITOR) 40 MG tablet [id number] Yes TAKE 1 TABLET BY MOUTH EVERYDAY AT BEDTIME Anthony Craig., Craig Taking Active            Med Note [facility], TED E   Wed Jun 20, 2020  8:18 PM)    b complex vitamins tablet [id number] Yes Take 1 tablet by mouth daily. Provider, Historical, Craig Taking Active   calcium-vitamin D 250-100 MG-UNIT per tablet [id number]  Take 1  tablet by mouth daily. Provider, Historical, Craig  Consider Medication Status and Discontinue (Patient Preference)   cholecalciferol (VITAMIN D) 1000 UNITS tablet [id number] Yes Take 1,000 Units by mouth daily. Provider, Historical, Craig Taking Active   Coenzyme Q10 (COQ10) 200 MG CAPS [id number] Yes Take 100 mg by mouth daily.  Provider, Historical, Craig Taking Active Self  Continuous Blood Gluc Sensor ([place]) [place] [id number] Yes by Does not apply route. Provider, Historical, Craig Taking Active   diphenhydrAMINE (BENADRYL) 25 MG tablet [id number]  Take 25 mg by mouth every 6 (six) hours as needed. Provider, Historical, Craig  Consider Medication Status and Discontinue (Patient Preference)            Med Note Celene Kras, ANASTASIYA V   Mon Nov 10, 2016  3:04 PM) prn  diphenoxylate-atropine (LOMOTIL) 2.5-0.025 MG per tablet [id number] Yes Take 1 tablet by mouth 4 (four) times daily as needed for diarrhea or loose stools. Provider, Historical, Craig Taking Active Self  erythromycin ophthalmic ointment [id number]  Place a 1/2 inch ribbon of ointment into the lower eyelid. Faustino Congress, NP  Consider Medication Status and Discontinue (Completed Course)   fluocinonide (LIDEX) 0.05 % external solution [id number]  Apply 1 application topically daily. To affected areas of scalp  Provider, Historical, Craig  Consider Medication Status and Discontinue (Completed Course)   HYDROcodone-acetaminophen (NORCO/VICODIN) 5-325 MG tablet [id number] No Take 1-2 tablets by mouth every 4 (four) hours as needed for moderate pain.  Patient not taking: No sig reported   Anthony Craig., Craig Not Taking Active   hydrocortisone 2.5 % lotion [id number] Yes Apply 1 application topically 2 (two) times daily. To affected areas of face only as needed for flares of itching/scaling  Provider, Historical, Craig Taking Active   hydroxypropyl methylcellulose / hypromellose (ISOPTO TEARS / GONIOVISC) 2.5 % ophthalmic solution [id number] Yes  Place 1 drop into both eyes 4 (four) times daily as needed for dry eyes. Provider, Historical, Craig  Taking Active   insulin aspart (NOVOLOG FLEXPEN) 100 UNIT/ML FlexPen [id number]  Inject 4 Units into the skin 3 (three) times daily with meals. 90 day supply. Generic ok.  Patient taking differently: Inject 7 Units into the skin 3 (three) times daily with meals. 90 day supply. Generic ok.   Anthony Craig., Craig  Consider Medication Status and Discontinue (Change in therapy)            Med Note Hassan Buckler, MCKENZIE A   Wed Jun 13, 2020  2:17 PM) 5 units at breakfast, 3 units at lunch and 5 units in PM  insulin aspart protamine - aspart (NOVOLOG MIX 70/30 FLEXPEN) (70-30) 100 UNIT/ML FlexPen [id number] Yes 12 units in the am and 6 units after dinner. Provider, Historical, Craig Taking Active   insulin glargine (LANTUS) 100 UNIT/ML injection [id number] No Inject 10 Units into the skin daily.  Patient not taking: Reported on 12/10/2020   Provider, Historical, Craig Not Taking Active            Med Note Hassan Buckler, MCKENZIE A   Wed Jun 13, 2020  2:17 PM) At bedtime  Krill Oil 500 MG CAPS [id number] Yes Take 1 capsule by mouth daily. Joline Salt Provider, Historical, Craig Taking Active Self  lipase/protease/amylase (CREON) 36000 UNITS CPEP capsule [id number] Yes Take 36,000 Units by mouth. Provider, Historical, Craig Taking Active   Misc Natural Products (OSTEO BI-FLEX ADV JOINT SHIELD PO) [id number] Yes Take by mouth 2 (two) times daily. Provider, Historical, Craig Taking Active   Multiple Vitamins-Minerals (MULTIVITAMIN PO) [id number] Yes Take 1 tablet by mouth daily. Provider, Historical, Craig Taking Active Self  Multiple Vitamins-Minerals (PRESERVISION AREDS PO) [id number] Yes Take by mouth.  Provider, Historical, Craig Taking Active   omega-3 acid ethyl esters (LOVAZA) 1 g capsule [id number] Yes Take by mouth 2 (two) times daily.  Provider, Historical, Craig Taking Active   [facility] VERIO test strip [id number] Yes CHECK BLOOD SUGAR 3  TIMES A DAY (DX E11.9) Anthony Craig., Craig Taking Active   oxybutynin (DITROPAN XL) 5 MG 24 hr tablet [id number]  Take 1 tablet (5 mg total) by mouth at bedtime. Anthony Craig., Craig  Consider Medication Status and Discontinue (Change in therapy)   pantoprazole (PROTONIX) 40 MG tablet [id number] Yes TAKE 1 TABLET BY MOUTH EVERY DAY Anthony Craig., Craig Taking Active   primidone (MYSOLINE) 50 MG tablet [id number]  Take 1 tablet (50 mg total) by mouth 4 (four) times daily.  Patient taking differently: Take 50 mg by mouth at bedtime.   Anthony Craig., Craig  Consider Medication Status and Discontinue (Change in therapy)   SODIUM CHLORIDE, GU IRRIGANT, IR [id number]  Irrigate with as directed. Irrigant every day flush drain once daily with 10 ML saline solution  Provider, Historical, Craig  Consider Medication Status and Discontinue (Completed Course)   ursodiol (ACTIGALL) 300 MG capsule [id number] Yes Take 300 mg by mouth 2 (two) times daily. Provider, Historical, Craig Taking Active   vitamin B-12 (CYANOCOBALAMIN) 1000 MCG tablet [id number] Yes Take 5,000 mcg by mouth daily.  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   Chest discomfort 02/08/2015   Diabetes ([place]) 02/08/2015  Vertigo 02/08/2015   Benign essential tremor 02/08/2015   Fatigue 02/08/2015   Acid reflux 02/08/2015   HLD (hyperlipidemia) 02/08/2015   BP (high blood pressure) 02/08/2015   Hernia, inguinal, left 02/08/2015   Cannot sleep 02/08/2015   Cancer of pancreas ([place]) 02/08/2015   Pancreatitis 02/08/2015   CA of prostate ([place]) 02/08/2015    Immunization History  Administered Date(s) Administered   Fluad Quad(high Dose 65+) 07/11/2020   Influenza, High Dose  Seasonal PF 06/14/2019   Influenza,inj,Quad PF,6-35 Mos 07/01/2017   Influenza-Unspecified 07/09/2015   PFIZER(Purple Top)SARS-COV-2 Vaccination 10/20/2019, 11/08/2019, 09/13/2020   Pneumococcal Conjugate-13 11/14/2013   Pneumococcal Polysaccharide-23 06/16/2007, 06/08/2010   Td 06/08/2006   Tdap 07/01/2017   Zoster Recombinat (Shingrix) 12/04/2020    Conditions to be addressed/monitored:  Hypertension, Hyperlipidemia, and Diabetes  There are no care plans that you recently modified to display for this patient.    Medication Assistance: None required.  Patient affirms current coverage meets needs.  Compliance/Adherence/Medication fill history: Care Gaps: FOOT EXAM URINE MICROALBUMIN COVID-19 Vaccine Booster 4 Zoster Vaccines- Shingrix  (1st one given on 12/04/2020) OPHTHALMOLOGY EXAM (last completed 04/24/2020)  Star-Rating Drugs: Atorvastatin 40 mg last filled on 10/11/2019 [facility]  Patient's preferred pharmacy is:  [facility] [address] [place] [address] Phone: [phone] Fax: [place], [address] [place] [address] [place] [address] Phone: [phone] Fax: [phone]  [place], [place] [address] Phone: [phone] Fax: [place] [id number] [address], [place] [address] [place] [address] Phone: [phone] Fax: [place] [id number], [place] [address] Phone: [phone] Fax: [phone]  [id number] [place]Phone: [phone]: [phone] [place] [place] [address]: [phone]: [phone] Uses pill box? Yes Pt endorses 100% compliance  We discussed: Current pharmacy is preferred with insurance plan and patient is satisfied with pharmacy services Patient decided to: Continue current medication management strategy  Care Plan and Follow Up Patient Decision:  Patient agrees to Care Plan and Follow-up.  Plan: Telephone follow up appointment with care management team member scheduled for:  10/29/2021 at 3:00 PM  AJunius Argyle PharmD, [phone]

## 2021-05-03 NOTE — Patient Instructions (Signed)
Visit Information It was great speaking with you today!  Please let me know if you have any questions about our visit.   Goals Addressed             This Visit's Progress    Monitor and Manage My Blood Sugar-Diabetes Type 2       Timeframe:  Long-Range Goal Priority:  High Start Date:  05/03/21                            Expected End Date: 05/03/22                      Follow Up Date 07/05/2021    - check blood sugar at prescribed times - check blood sugar before and after exercise - check blood sugar if I feel it is too high or too low - enter blood sugar readings and medication or insulin into daily log    Why is this important?   Checking your blood sugar at home helps to keep it from getting very high or very low.  Writing the results in a diary or log helps the doctor know how to care for you.  Your blood sugar log should have the time, date and the results.  Also, write down the amount of insulin or other medicine that you take.  Other information, like what you ate, exercise done and how you were feeling, will also be helpful.     Notes:         Patient Care Plan: General Pharmacy (Adult)     Problem Identified: Hypertension, Hyperlipidemia, and Diabetes   Priority: High     Long-Range Goal: Patient-Specific Goal   Start Date: 05/03/2021  Expected End Date: 11/03/2021  This Visit's Progress: On track  Priority: High  Note:   Current Barriers:  No barriers noted  Pharmacist Clinical Goal(s):  Patient will maintain control of diabetes as evidenced by A1c less than 8%  maintain control of hyperlipidemia as evidenced by LDL less than 100 through collaboration with PharmD and provider.   Interventions: 1:1 collaboration with Jerrol Banana., MD regarding development and update of comprehensive plan of care as evidenced by provider attestation and co-signature Inter-disciplinary care team collaboration (see longitudinal plan of care) Comprehensive  medication review performed; medication list updated in electronic medical record  Hypertension (BP goal <140/90) -Controlled -Current treatment: None -Medications previously tried: NA  -Denies hypotensive symptoms:  -Educated on Daily salt intake goal < 2300 mg; -Counseled on diet and exercise extensively  Hyperlipidemia: (LDL goal < 100) -Controlled -Current treatment: Atorvastatin 40 mg nightly  Lovaza 1g twice daily -Medications previously tried: NA  -Recommended to continue current medication  Diabetes (A1c goal <8%) -Controlled -Current medications: Novolog 70/30 12 units AM, 6 units PM, will give extra 4 units if sugars are elevated.  -Medications previously tried: NA  -Current home glucose readings: Uses Dexcom G6 -Reports hypoglycemic symptoms:  -Current exercise: Exercises 3x weekly gym.  -Recommended to continue current medication  Osteoarthritis (Goal: Minimize arthritis pain) -Controlled -Current treatment  Osteo Bi-Flex twice daily  -Medications previously tried: NA -Patient reports supplementation has improved his arthritis pain.  -Recommend Acetaminophen CR 650 mg every 8 hours as needed. Counseled to avoid >3000 mg acetaminophen daily  -Recommended to continue current medication  Patient Goals/Self-Care Activities Patient will:  - check glucose at least four times daily, document, and provide at future appointments check  blood pressure weekly, document, and provide at future appointments  Follow Up Plan: Telephone follow up appointment with care management team member scheduled for:  10/29/2021 at 3:00 PM      Patient agreed to services and verbal consent obtained.   The patient verbalized understanding of instructions, educational materials, and care plan provided today and declined offer to receive copy of patient instructions, educational materials, and care plan.   [name], PharmD, [place], [place] [phone]

## 2021-05-27 ENCOUNTER — Telehealth: Payer: Self-pay

## 2021-05-27 NOTE — Progress Notes (Signed)
Chronic Care Management Pharmacy Assistant   Name: Rayburn Depasquale  MRN: [id number] DOB: 1928-03-10  Reason for Encounter:Hypertension and Diabetes Disease State Call.   Recent office visits:  No recent Office Visit  Recent consult visits:  No Recent [facility] visits:  None in previous 6 months  Medications: Outpatient Encounter Medications as of 05/27/2021  Medication Sig Note   ACCU-CHEK SMARTVIEW test strip CHECK SUGAR THREE TIMES DAILY    Alcohol Swabs (B-D SINGLE USE SWABS REGULAR) PADS USE AS DIRECTED  WITH  EACH  FINGERSTICK    aspirin EC 81 MG tablet Take 81 mg by mouth daily.    atorvastatin (LIPITOR) 40 MG tablet TAKE 1 TABLET BY MOUTH EVERYDAY AT BEDTIME    b complex vitamins tablet Take 1 tablet by mouth daily.    calcium-vitamin D 250-100 MG-UNIT per tablet Take 1 tablet by mouth daily.    cholecalciferol (VITAMIN D) 1000 UNITS tablet Take 1,000 Units by mouth daily.    Coenzyme Q10 (COQ10) 200 MG CAPS Take 100 mg by mouth daily.     diphenhydrAMINE (BENADRYL) 25 MG tablet Take 25 mg by mouth every 6 (six) hours as needed. 11/10/2016: prn   diphenoxylate-atropine (LOMOTIL) 2.5-0.025 MG per tablet Take 1 tablet by mouth 4 (four) times daily as needed for diarrhea or loose stools.    erythromycin ophthalmic ointment Place a 1/2 inch ribbon of ointment into the lower eyelid.    fluocinonide (LIDEX) 0.05 % external solution Apply 1 application topically daily. To affected areas of scalp     Glucosamine-Chondroitin (OSTEO BI-FLEX REGULAR STRENGTH PO) Take 1 tablet by mouth in the morning and at bedtime.    HYDROcodone-acetaminophen (NORCO/VICODIN) 5-325 MG tablet Take 1-2 tablets by mouth every 4 (four) hours as needed for moderate pain. (Patient not taking: No sig reported)    hydrocortisone 2.5 % lotion Apply 1 application topically 2 (two) times daily. To affected areas of face only as needed for flares of itching/scaling     hydroxypropyl  methylcellulose / hypromellose (ISOPTO TEARS / GONIOVISC) 2.5 % ophthalmic solution Place 1 drop into both eyes 4 (four) times daily as needed for dry eyes.    insulin aspart protamine - aspart (NOVOLOG MIX 70/30 FLEXPEN) (70-30) 100 UNIT/ML FlexPen 12 units in the am and 6 units after dinner.    Krill Oil 500 MG CAPS Take 1 capsule by mouth daily. [place]    lipase/protease/amylase (CREON) 36000 UNITS CPEP capsule Take 36,000 Units by mouth.    Misc Natural Products (OSTEO BI-FLEX ADV JOINT SHIELD PO) Take by mouth 2 (two) times daily.    Multiple Vitamins-Minerals (MULTIVITAMIN PO) Take 1 tablet by mouth daily.    Multiple Vitamins-Minerals (PRESERVISION AREDS PO) Take by mouth.     omega-3 acid ethyl esters (LOVAZA) 1 g capsule Take by mouth 2 (two) times daily.     ONETOUCH VERIO test strip CHECK BLOOD SUGAR 3 TIMES A DAY (DX E11.9)    oxybutynin (DITROPAN XL) 5 MG 24 hr tablet Take 1 tablet (5 mg total) by mouth at bedtime.    pantoprazole (PROTONIX) 40 MG tablet TAKE 1 TABLET BY MOUTH EVERY DAY    primidone (MYSOLINE) 50 MG tablet Take 1 tablet (50 mg total) by mouth 4 (four) times daily. (Patient taking differently: Take 50 mg by mouth at bedtime.)    SODIUM CHLORIDE, GU IRRIGANT, IR Irrigate with as directed. Irrigant every day flush drain once daily with 10 ML saline solution  ursodiol (ACTIGALL) 300 MG capsule Take 300 mg by mouth 2 (two) times daily.    vitamin B-12 (CYANOCOBALAMIN) 1000 MCG tablet Take 5,000 mcg by mouth daily.     No facility-administered encounter medications on file as of 05/27/2021.      Care Gaps: FOOT EXAM URINE MICROALBUMIN COVID-19 Vaccine Booster 4 Zoster Vaccines- Shingrix  (1st one given on 12/04/2020) OPHTHALMOLOGY EXAM (last completed 04/24/2020) Influenza Vaccine  Star Rating Drugs: Atorvastatin 40 mg last filled on 10/11/2019 for a 90-Day supply with [facility] (patient also does go to the [place] so it's possible he is getting his  prescriptions from there pharmacy)  Reviewed chart prior to disease state call. Spoke with patient regarding BP  Recent Office Vitals: BP Readings from Last 3 Encounters:  12/10/20 95/69  08/09/20 (!) 154/76  07/11/20 126/75   Pulse Readings from Last 3 Encounters:  12/10/20 74  08/09/20 66  07/11/20 69    Wt Readings from Last 3 Encounters:  12/10/20 145 lb (65.8 kg)  08/09/20 151 lb (68.5 kg)  07/11/20 150 lb (68 kg)     Kidney Function Lab Results  Component Value Date/Time   CREATININE 0.96 07/16/2020 09:15 AM   CREATININE 1.02 11/23/2019 03:52 PM   CREATININE 1.10 01/17/2014 04:13 AM   CREATININE 1.04 01/16/2014 02:01 AM   GFRNONAA 69 07/16/2020 09:15 AM   GFRNONAA >60 01/17/2014 04:13 AM   GFRAA 80 07/16/2020 09:15 AM   GFRAA >60 01/17/2014 04:13 AM    BMP Latest Ref Rng & Units 07/16/2020 11/23/2019 10/17/2019  Glucose 65 - 99 mg/dL 144(H) 92 145(H)  BUN 10 - 36 mg/dL '13 20 21  '$ Creatinine 0.76 - 1.27 mg/dL 0.96 1.02 0.85  BUN/Creat Ratio 10 - '24 14 20 '$ -  Sodium 134 - 144 mmol/L 138 138 136  Potassium 3.5 - 5.2 mmol/L 4.3 4.7 4.0  Chloride 96 - 106 mmol/L 100 101 103  CO2 20 - 29 mmol/L '26 22 23  '$ Calcium 8.6 - 10.2 mg/dL 9.5 9.7 9.2    Current antihypertensive regimen:  None  How often are you checking your Blood Pressure?  None ID Current home BP readings: None ID What recent interventions/DTPs have been made by any provider to improve Blood Pressure control since last CPP Visit: None ID Any recent hospitalizations or ED visits since last visit with CPP? No What diet changes have been made to improve Blood Pressure Control?  Patient states he is trying to eat as healthy as he can. 45 gCarbohydrate,15 g of Protein and no Fat. What exercise is being done to improve your Blood Pressure Control?   Exercises 3x weekly gym  Recent Relevant Labs: Lab Results  Component Value Date/Time   HGBA1C 8.2 (A) 12/10/2020 09:41 AM   HGBA1C 8.4 (H) 07/16/2020 09:15 AM    HGBA1C 7.6 (A) 03/13/2020 02:11 PM   HGBA1C 7.6 (H) 11/23/2019 03:52 PM   HGBA1C 7.4 03/09/2017 12:00 AM   HGBA1C 9.2 11/08/2015 12:00 AM    Kidney Function Lab Results  Component Value Date/Time   CREATININE 0.96 07/16/2020 09:15 AM   CREATININE 1.02 11/23/2019 03:52 PM   CREATININE 1.10 01/17/2014 04:13 AM   CREATININE 1.04 01/16/2014 02:01 AM   GFRNONAA 69 07/16/2020 09:15 AM   GFRNONAA >60 01/17/2014 04:13 AM   GFRAA 80 07/16/2020 09:15 AM   GFRAA >60 01/17/2014 04:13 AM    Current antihyperglycemic regimen:  Novolog 70/30 12 units AM, 6 units PM, will give extra 4 units if  sugars are elevated.  What recent interventions/DTPs have been made to improve glycemic control:  Patient states his sugars has been elevated,and he does not understand why.Patient states he woke up at 6:00 am this morning with a blood sugar reading around 289.Patient states this "scares me".Patient reports he is going to follow up with his endocrinologist on 06/06/2021,but would like the clinical pharmacist recommendation if he needs to do anything.Notified Clinical Pharmacist. Per Clinical Pharmacist, What did he have for dinner the previous night? How much insulin did he give himself? Any illness or other medication changes that could be causing it? If it is just one reading then nothing needs to be done, but I would need a log of his AM and PM readings to know if we need to do anything different.I don't have him on Dexcom Clarity to view his recent readings. If he has an email he has access to I can send him an email to share his data with me. For now, I would continue his current diabetes regimen until follow-up with Endocrinology on 9/1. Make sure he is giving his insulin 15-30 minutes BEFORE he eats a meal. Patient states he does not have a e-mail,but he can come by the office for the clinical pharmacist to upload his readings.Patient reports the only change he has had was starting Super Beets powder (5 grams  of sugar) which have made him feel better and have more energy.Patient confirms he is taking his Novolog as directed  12 units AM, 6 units PM, will give extra 4 units if sugars are elevated. Patient states he had a sirloin steak that was cook in gray with two slices of bread and 2 sips of a diet coke. Per Clinical Pharmacist, No need for office visit tomorrow, he should just continue to monitor readings and his carbohydrate intake, and make sure he is giving his insulin properly. His endo follow-up is soon so it is ok to wait until then. Patient Verbalized Understanding. Have there been any recent hospitalizations or ED visits since last visit with CPP? No Patient reports hypoglycemic symptoms, including  Patient states he will feel weak and unsteady when his sugars are low but this only happens once or twice a month. Patient denies hyperglycemic symptoms, including blurry vision, excessive thirst, fatigue, polyuria, and weakness How often are you checking your blood sugar? Uses Dexcom G6 What are your blood sugars ranging?  On 05/27/2021 at 6:00 am it was around 289(if patient remembers correctly) During the week, how often does your blood glucose drop below 70? Never Are you checking your feet daily/regularly?   Patient denies numbness,pain, or tingling sensations in his feet.  Adherence Review: Is the patient currently on a STATIN medication? Yes Is the patient currently on ACE/ARB medication? No Does the patient have >5 day gap between last estimated fill dates? No  Anderson Malta Clinical Production designer, theatre/television/film [phone]

## 2021-06-20 ENCOUNTER — Ambulatory Visit: Payer: Self-pay | Admitting: Family Medicine

## 2021-06-20 NOTE — Progress Notes (Deleted)
Annual Wellness Visit     Patient: Anthony Craig, Male    DOB: 09/01/1928, 87 y.o.   MRN: [id number] Visit Date: 06/20/2021  Today's Provider: Wilhemena Durie, MD   No chief complaint on file.  Subjective    Anthony Craig is a 87 y.o. male who presents today for his Annual Wellness Visit. He reports consuming a {diet types:17450} diet. {Exercise:19826} He generally feels {well/fairly well/poorly:18703}. He reports sleeping {well/fairly well/poorly:18703}. He {does/does not:200015} have additional problems to discuss today.   HPI    Medications: Outpatient Medications Prior to Visit  Medication Sig   ACCU-CHEK SMARTVIEW test strip CHECK SUGAR THREE TIMES DAILY   Alcohol Swabs (B-D SINGLE USE SWABS REGULAR) PADS USE AS DIRECTED  WITH  EACH  FINGERSTICK   aspirin EC 81 MG tablet Take 81 mg by mouth daily.   atorvastatin (LIPITOR) 40 MG tablet TAKE 1 TABLET BY MOUTH EVERYDAY AT BEDTIME   b complex vitamins tablet Take 1 tablet by mouth daily.   calcium-vitamin D 250-100 MG-UNIT per tablet Take 1 tablet by mouth daily.   cholecalciferol (VITAMIN D) 1000 UNITS tablet Take 1,000 Units by mouth daily.   Coenzyme Q10 (COQ10) 200 MG CAPS Take 100 mg by mouth daily.    diphenhydrAMINE (BENADRYL) 25 MG tablet Take 25 mg by mouth every 6 (six) hours as needed.   diphenoxylate-atropine (LOMOTIL) 2.5-0.025 MG per tablet Take 1 tablet by mouth 4 (four) times daily as needed for diarrhea or loose stools.   erythromycin ophthalmic ointment Place a 1/2 inch ribbon of ointment into the lower eyelid.   fluocinonide (LIDEX) 0.05 % external solution Apply 1 application topically daily. To affected areas of scalp    Glucosamine-Chondroitin (OSTEO BI-FLEX REGULAR STRENGTH PO) Take 1 tablet by mouth in the morning and at bedtime.   HYDROcodone-acetaminophen (NORCO/VICODIN) 5-325 MG tablet Take 1-2 tablets by mouth every 4 (four) hours as needed for moderate pain. (Patient not taking:  No sig reported)   hydrocortisone 2.5 % lotion Apply 1 application topically 2 (two) times daily. To affected areas of face only as needed for flares of itching/scaling    hydroxypropyl methylcellulose / hypromellose (ISOPTO TEARS / GONIOVISC) 2.5 % ophthalmic solution Place 1 drop into both eyes 4 (four) times daily as needed for dry eyes.   insulin aspart protamine - aspart (NOVOLOG MIX 70/30 FLEXPEN) (70-30) 100 UNIT/ML FlexPen 12 units in the am and 6 units after dinner.   Krill Oil 500 MG CAPS Take 1 capsule by mouth daily. [place]   lipase/protease/amylase (CREON) 36000 UNITS CPEP capsule Take 36,000 Units by mouth.   Misc Natural Products (OSTEO BI-FLEX ADV JOINT SHIELD PO) Take by mouth 2 (two) times daily.   Multiple Vitamins-Minerals (MULTIVITAMIN PO) Take 1 tablet by mouth daily.   Multiple Vitamins-Minerals (PRESERVISION AREDS PO) Take by mouth.    omega-3 acid ethyl esters (LOVAZA) 1 g capsule Take by mouth 2 (two) times daily.    ONETOUCH VERIO test strip CHECK BLOOD SUGAR 3 TIMES A DAY (DX E11.9)   oxybutynin (DITROPAN XL) 5 MG 24 hr tablet Take 1 tablet (5 mg total) by mouth at bedtime.   pantoprazole (PROTONIX) 40 MG tablet TAKE 1 TABLET BY MOUTH EVERY DAY   primidone (MYSOLINE) 50 MG tablet Take 1 tablet (50 mg total) by mouth 4 (four) times daily. (Patient taking differently: Take 50 mg by mouth at bedtime.)   SODIUM CHLORIDE, GU IRRIGANT, IR Irrigate with as directed. Irrigant  every day flush drain once daily with 10 ML saline solution    ursodiol (ACTIGALL) 300 MG capsule Take 300 mg by mouth 2 (two) times daily.   vitamin B-12 (CYANOCOBALAMIN) 1000 MCG tablet Take 5,000 mcg by mouth daily.    No facility-administered medications prior to visit.    Allergies  Allergen Reactions   Doxycycline Other (See Comments)    Reaction: per [place] patient called on 11/30/14 stating he was having a reaction to a medication possibly including redness, swelling and itching, he was not  sure at that time if it was this medication or Hydrocodone-homatropin.    Hydrocodone Bit-Homatrop Mbr Other (See Comments)    Reaction:  Reaction: per [place] patient called on 11/30/14 stating he was having a reaction to a medication possibly including redness, swelling and itching, he was not sure at that time if it was this medication or Doxy that he was taking at the same time. Patient has been taking Hydrocodone with no problems.    Patient Care Team: Jerrol Banana., MD as PCP - General (Family Medicine) Benito Mccreedy, MD as Referring Physician (Family Medicine) Dingeldein, Remo Lipps, MD (Ophthalmology) Gabriel Carina Betsey Holiday, MD as Physician Assistant (Endocrinology)  Review of Systems  All other systems reviewed and are negative.  {Labs  Heme  Chem  Endocrine  Serology  Results Review (optional):23779}    Objective    Vitals: There were no vitals taken for this visit. {Show previous vital signs (optional):23777}  Physical Exam ***  Most recent functional status assessment: No flowsheet data found. Most recent fall risk assessment: Fall Risk  06/13/2020  Falls in the past year? 0  Number falls in past yr: 0  Injury with Fall? 0    Most recent depression screenings: PHQ 2/9 Scores 06/13/2020 06/07/2019  PHQ - 2 Score 0 0  PHQ- 9 Score - -   Most recent cognitive screening: 6CIT Screen 06/07/2019  What Year? 0 points  What month? 0 points  What time? 0 points  Count back from 20 0 points  Months in reverse 0 points  Repeat phrase 2 points  Total Score 2   Most recent Audit-C alcohol use screening Alcohol Use Disorder Test (AUDIT) 06/13/2020  1. How often do you have a drink containing alcohol? 2  2. How many drinks containing alcohol do you have on a typical day when you are drinking? 0  3. How often do you have six or more drinks on one occasion? 0  AUDIT-C Score 2  Alcohol Brief Interventions/Follow-up AUDIT Score <7 follow-up not indicated   A score of 3 or  more in women, and 4 or more in men indicates increased risk for alcohol abuse, EXCEPT if all of the points are from question 1   No results found for any visits on 06/20/21.  Assessment & Plan     Annual wellness visit done today including the all of the following: Reviewed patient's Family Medical History Reviewed and updated list of patient's medical providers Assessment of cognitive impairment was done Assessed patient's functional ability Established a written schedule for health screening [place] Completed and Reviewed  Exercise Activities and Dietary recommendations  Goals      Monitor and Manage My Blood Sugar-Diabetes Type 2     Timeframe:  Long-Range Goal Priority:  High Start Date:  05/03/21  Expected End Date: 05/03/22                      Follow Up Date 07/05/2021    - check blood sugar at prescribed times - check blood sugar before and after exercise - check blood sugar if I feel it is too high or too low - enter blood sugar readings and medication or insulin into daily log    Why is this important?   Checking your blood sugar at home helps to keep it from getting very high or very low.  Writing the results in a diary or log helps the doctor know how to care for you.  Your blood sugar log should have the time, date and the results.  Also, write down the amount of insulin or other medicine that you take.  Other information, like what you ate, exercise done and how you were feeling, will also be helpful.     Notes:      water intake     Recommend increasing water intake to 4-6 glasses of water a day.        Immunization History  Administered Date(s) Administered   Fluad Quad(high Dose 65+) 07/11/2020   Influenza, High Dose Seasonal PF 06/14/2019   Influenza,inj,Quad PF,6-35 Mos 07/01/2017   Influenza-Unspecified 07/09/2015   PFIZER(Purple Top)SARS-COV-2 Vaccination 10/20/2019, 11/08/2019, 09/13/2020    Pneumococcal Conjugate-13 11/14/2013   Pneumococcal Polysaccharide-23 06/16/2007, 06/08/2010   Td 06/08/2006   Tdap 07/01/2017   Zoster Recombinat (Shingrix) 12/04/2020    Health Maintenance  Topic Date Due   FOOT EXAM  02/03/2018   URINE MICROALBUMIN  09/07/2019   COVID-19 Vaccine (4 - Booster for Pfizer series) 12/06/2020   OPHTHALMOLOGY EXAM  04/24/2021   INFLUENZA VACCINE  05/06/2021   HEMOGLOBIN A1C  06/12/2021   TETANUS/TDAP  07/02/2027   PNA vac Low Risk Adult  Completed   Zoster Vaccines- Shingrix  Completed   HPV VACCINES  Aged Out     Discussed health benefits of physical activity, and encouraged him to engage in regular exercise appropriate for his age and condition.    ***  No follow-ups on file.     {provider attestation***:1}   Wilhemena Durie, MD  [facility] [phone] (phone) [phone] (fax)  [place]

## 2021-07-02 ENCOUNTER — Telehealth: Payer: Self-pay

## 2021-07-02 NOTE — Telephone Encounter (Signed)
Copied from [place] [phone]. Topic: General - Other >> Jul 02, 2021 10:30 AM Bayard Beaver wrote: Reason for [id number]:[id number] wants call back, asking if supplement , procyanidin, would be ok for him to take without harm. Please call back

## 2021-07-02 NOTE — Telephone Encounter (Signed)
Please advise 

## 2021-07-02 NOTE — Telephone Encounter (Signed)
Patient wants call back, asking if supplement , procyanidin, would be ok for him to take without harm. Please call back

## 2021-07-05 NOTE — Telephone Encounter (Signed)
Supplement is for hair growth. Patient advised of message below.

## 2021-07-28 ENCOUNTER — Emergency Department: Payer: No Typology Code available for payment source

## 2021-07-28 ENCOUNTER — Emergency Department
Admission: EM | Admit: 2021-07-28 | Discharge: 2021-07-28 | Disposition: A | Discharge status: Home / Self Care | Payer: No Typology Code available for payment source | Attending: Emergency Medicine | Admitting: Emergency Medicine

## 2021-07-28 ENCOUNTER — Other Ambulatory Visit: Payer: Self-pay

## 2021-07-28 DIAGNOSIS — E119 Type 2 diabetes mellitus without complications: Secondary | ICD-10-CM | POA: Insufficient documentation

## 2021-07-28 DIAGNOSIS — I10 Essential (primary) hypertension: Secondary | ICD-10-CM | POA: Diagnosis not present

## 2021-07-28 DIAGNOSIS — Z87891 Personal history of nicotine dependence: Secondary | ICD-10-CM | POA: Insufficient documentation

## 2021-07-28 DIAGNOSIS — Z794 Long term (current) use of insulin: Secondary | ICD-10-CM

## 2021-07-28 DIAGNOSIS — I2510 Atherosclerotic heart disease of native coronary artery without angina pectoris: Secondary | ICD-10-CM | POA: Diagnosis not present

## 2021-07-28 DIAGNOSIS — Z7982 Long term (current) use of aspirin: Secondary | ICD-10-CM | POA: Diagnosis not present

## 2021-07-28 DIAGNOSIS — R1013 Epigastric pain: Secondary | ICD-10-CM | POA: Insufficient documentation

## 2021-07-28 DIAGNOSIS — Z7984 Long term (current) use of oral hypoglycemic drugs: Secondary | ICD-10-CM | POA: Insufficient documentation

## 2021-07-28 LAB — BASIC METABOLIC PANEL
Anion gap: 11 (ref 5–15)
BUN: 18 mg/dL (ref 8–23)
CO2: 26 mmol/L (ref 22–32)
Calcium: 8.6 mg/dL — ABNORMAL LOW (ref 8.9–10.3)
Chloride: 98 mmol/L (ref 98–111)
Creatinine, Ser: 0.83 mg/dL (ref 0.61–1.24)
GFR, Estimated: >60 mL/min (ref >60)
Glucose, Bld: 142 mg/dL — ABNORMAL HIGH (ref 70–99)
Potassium: 4.7 mmol/L (ref 3.5–5.1)
Sodium: 135 mmol/L (ref 135–145)

## 2021-07-28 LAB — CBC
HCT: 43 % (ref 39.0–52.0)
Hemoglobin: 14.7 g/dL (ref 13.0–17.0)
MCH: 28.9 pg (ref 26.0–34.0)
MCHC: 34.2 g/dL (ref 30.0–36.0)
MCV: 84.5 fL (ref 80.0–100.0)
Platelets: 174 10*3/uL (ref 150–400)
RBC: 5.09 MIL/uL (ref 4.22–5.81)
RDW: 13.5 % (ref 11.5–15.5)
WBC: 6.9 10*3/uL (ref 4.0–10.5)
nRBC: 0 % (ref 0.0–0.2)

## 2021-07-28 LAB — TROPONIN I (HIGH SENSITIVITY)
Troponin I (High Sensitivity): 5 ng/L (ref <18)
Troponin I (High Sensitivity): 5 ng/L (ref <18)

## 2021-07-28 MED ORDER — ALUM & MAG HYDROXIDE-SIMETH 200-200-20 MG/5ML PO SUSP
30.0000 mL | Freq: Once | ORAL | Status: AC
  Administered 2021-07-28: 30 mL via ORAL
  Filled 2021-07-28: qty 30

## 2021-07-28 MED ORDER — FAMOTIDINE 20 MG PO TABS
40.0000 mg | ORAL_TABLET | Freq: Once | ORAL | Status: AC
  Administered 2021-07-28: 40 mg via ORAL
  Filled 2021-07-28: qty 2

## 2021-07-28 MED ORDER — FAMOTIDINE 20 MG PO TABS: 20.0000 mg | ORAL_TABLET | Freq: Two times a day (BID) | ORAL | 0 refills | Status: DC

## 2021-07-28 NOTE — ED Triage Notes (Signed)
Pt arrives via ems  from home Cp 10/10 central chest for 45 mins. Hx of MI in 2001. Ems ekg reported as Afib rate of 58. 324 Asprin given by ems. Pt reported CP had resolved after Asprin. A&O   20g rt AC

## 2021-07-28 NOTE — ED Triage Notes (Signed)
Pt comes via EMs with c/o mid sternal epigastric CP that radiates up. Pt denies any radiation. Pt was given aspirin by EMs.

## 2021-07-28 NOTE — ED Provider Notes (Signed)
[facility] Emergency Department Provider Note  ____________________________________________  Time seen: Approximately 6:52 PM  I have reviewed the triage vital signs and the nursing notes.   HISTORY  Chief Complaint Chest Pain    HPI Anthony Craig is a 87 y.o. male with a history of hypertension hyperlipidemia diabetes and pancreatic cancer status post Whipple procedure who comes ED complaining of epigastric pain that started today at about 2:00 PM while at rest.  Radiated up into the chest.  Feels achy.  No shortness of breath.  No vomiting or diaphoresis.  Not exertional, not pleuritic.  Resolved after receiving aspirin by EMS.  Currently feels well.  He does note being under increased stress lately due to spouse having acute illness.  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   Chest discomfort 02/08/2015   Diabetes ([place]) 02/08/2015   Vertigo 02/08/2015   Benign essential tremor 02/08/2015   Fatigue 02/08/2015   Acid reflux 02/08/2015   HLD (hyperlipidemia) 02/08/2015   BP (high blood pressure) 02/08/2015   Hernia, inguinal, left 02/08/2015   Cannot sleep 02/08/2015   Cancer of pancreas ([place]) 02/08/2015   Pancreatitis 02/08/2015   CA of prostate ([place]) 02/08/2015     Past Surgical History:  Procedure Laterality Date   APPENDECTOMY     1977   BILIARY DRAINAGE     CORONARY ARTERY BYPASS GRAFT     2001-3 vessel   HERNIA REPAIR Bilateral    2016-left, 2009-right   WHIPPLE PROCEDURE  2007     Prior to Admission medications   Medication Sig Start  Date End Date Taking? Authorizing Provider  famotidine (PEPCID) 20 MG tablet Take 1 tablet (20 mg total) by mouth 2 (two) times daily. 07/28/21  Yes Carrie Mew, MD  ACCU-CHEK SMARTVIEW test strip CHECK SUGAR THREE TIMES DAILY 04/28/16   Chrismon, Vickki Muff, PA-C  Alcohol Swabs (B-D SINGLE USE SWABS REGULAR) PADS USE AS DIRECTED  WITH  EACH  FINGERSTICK 12/24/15   Jerrol Banana., MD  aspirin EC 81 MG tablet Take 81 mg by mouth daily.    [provider]  atorvastatin (LIPITOR) 40 MG tablet TAKE 1 TABLET BY MOUTH EVERYDAY AT BEDTIME 12/26/18   Jerrol Banana., MD  b complex vitamins tablet Take 1 tablet by mouth daily.    [provider]  calcium-vitamin D 250-100 MG-UNIT per tablet Take 1 tablet by mouth daily.    [provider]  cholecalciferol (VITAMIN D) 1000 UNITS tablet Take 1,000 Units by mouth daily.    [provider]  Coenzyme Q10 (COQ10) 200 MG CAPS Take 100 mg by mouth daily.     [provider]  diphenhydrAMINE (BENADRYL) 25 MG tablet Take 25 mg by mouth every 6 (six) hours as needed.    [provider]  diphenoxylate-atropine (LOMOTIL) 2.5-0.025 MG per tablet Take 1 tablet by mouth 4 (four) times daily as needed for diarrhea or loose stools.    [provider]  erythromycin ophthalmic ointment Place a 1/2 inch ribbon  of ointment into the lower eyelid. 08/09/20   Faustino Congress, NP  fluocinonide (LIDEX) 0.05 % external solution Apply 1 application topically daily. To affected areas of scalp     [provider]  Glucosamine-Chondroitin (OSTEO BI-FLEX REGULAR STRENGTH PO) Take 1 tablet by mouth in the morning and at bedtime.    [provider]  HYDROcodone-acetaminophen (NORCO/VICODIN) 5-325 MG tablet Take 1-2 tablets by mouth every 4 (four) hours as needed for moderate pain. Patient not taking: No sig reported 04/16/18   Jerrol Banana., MD  hydrocortisone 2.5 % lotion Apply 1  application topically 2 (two) times daily. To affected areas of face only as needed for flares of itching/scaling     [provider]  hydroxypropyl methylcellulose / hypromellose (ISOPTO TEARS / GONIOVISC) 2.5 % ophthalmic solution Place 1 drop into both eyes 4 (four) times daily as needed for dry eyes.    [provider]  insulin aspart protamine - aspart (NOVOLOG MIX 70/30 FLEXPEN) (70-30) 100 UNIT/ML FlexPen 12 units in the am and 6 units after dinner. 11/12/20   [provider]  Javier Docker Oil 500 MG CAPS Take 1 capsule by mouth daily. [place]    [provider]  lipase/protease/amylase (CREON) 36000 UNITS CPEP capsule Take 36,000 Units by mouth.    [provider]  Misc Natural Products (OSTEO BI-FLEX ADV JOINT SHIELD PO) Take by mouth 2 (two) times daily.    [provider]  Multiple Vitamins-Minerals (MULTIVITAMIN PO) Take 1 tablet by mouth daily.    [provider]  Multiple Vitamins-Minerals (PRESERVISION AREDS PO) Take by mouth.     [provider]  omega-3 acid ethyl esters (LOVAZA) 1 g capsule Take by mouth 2 (two) times daily.     [provider]  ONETOUCH VERIO test strip CHECK BLOOD SUGAR 3 TIMES A DAY (DX E11.9) 08/03/19   Jerrol Banana., MD  oxybutynin (DITROPAN XL) 5 MG 24 hr tablet Take 1 tablet (5 mg total) by mouth at bedtime. 08/09/19   Jerrol Banana., MD  pantoprazole (PROTONIX) 40 MG tablet TAKE 1 TABLET BY MOUTH EVERY DAY 10/24/20   Jerrol Banana., MD  primidone (MYSOLINE) 50 MG tablet Take 1 tablet (50 mg total) by mouth 4 (four) times daily. Patient taking differently: Take 50 mg by mouth at bedtime. 12/02/16   Jerrol Banana., MD  SODIUM CHLORIDE, GU IRRIGANT, IR Irrigate with as directed. Irrigant every day flush drain once daily with 10 ML saline solution     [provider]  ursodiol (ACTIGALL) 300 MG capsule Take 300 mg by mouth 2 (two) times daily.     [provider]  vitamin B-12 (CYANOCOBALAMIN) 1000 MCG tablet Take 5,000 mcg by mouth daily.     [provider]     Allergies Doxycycline and Hydrocodone bit-homatrop mbr   Family History  Problem Relation Age of Onset   Heart disease Mother    Cancer Mother 68       breast   Heart disease Father    COPD Brother    Stroke Brother    Diabetes Other        1 and 2    Social History Social History   Tobacco Use   Smoking status: Former    Packs/day: 0.50    Years: 30.00    Pack years: 15.00    Types: Cigarettes    Quit date: 10/06/1979    Years since  quitting: 41.8   Smokeless tobacco: Never  Vaping Use   Vaping Use: Never used  Substance Use Topics   Alcohol use: Yes    Comment: Maybe a beer or 2 a month   Drug use: No    Review of Systems  Constitutional:   No fever or chills.  ENT:   No sore throat. No rhinorrhea. Cardiovascular:   No chest pain or syncope. Respiratory:   No dyspnea or cough. Gastrointestinal:   Positive epigastric pain as above without vomiting and diarrhea.  Musculoskeletal:   Negative for focal pain or swelling All other systems reviewed and are negative except as documented above in ROS and HPI.  ____________________________________________   PHYSICAL EXAM:  VITAL SIGNS: ED Triage Vitals  Enc Vitals Group     BP 07/28/21 1448 135/82     Pulse Rate 07/28/21 1448 (!) 46     Resp 07/28/21 1448 18     Temp 07/28/21 1448 (!) 97.5 F (36.4 C)     Temp src --      SpO2 07/28/21 1448 94 %     Weight --      Height --      Head Circumference --      Peak Flow --      Pain Score 07/28/21 1446 5     Pain Loc --      Pain Edu? --      Excl. in [place]? --     Vital signs reviewed, nursing assessments reviewed.   Constitutional:   Alert and oriented. Non-toxic appearance. Eyes:   Conjunctivae are normal. EOMI. PERRL. ENT      Head:   Normocephalic and atraumatic.      Nose:   Wearing a mask.      Mouth/Throat:    Wearing a mask.      Neck:   No meningismus. Full ROM. Hematological/Lymphatic/Immunilogical:   No cervical lymphadenopathy. Cardiovascular:   Bradycardia heart rate of 50. Symmetric bilateral radial and DP pulses.  No murmurs. Cap refill less than 2 seconds. Respiratory:   Normal respiratory effort without tachypnea/retractions. Breath sounds are clear and equal bilaterally. No wheezes/rales/rhonchi. Gastrointestinal:   Soft and nontender. Non distended. There is no CVA tenderness.  No rebound, rigidity, or guarding. Genitourinary:   deferred Musculoskeletal:   Normal range of motion in all extremities. No joint effusions.  No lower extremity tenderness.  No edema. Neurologic:   Normal speech and language.  Motor grossly intact. No acute focal neurologic deficits are appreciated.  Skin:    Skin is warm, dry and intact. No rash noted.  No petechiae, purpura, or bullae.  ____________________________________________    LABS (pertinent positives/negatives) (all labs ordered are listed, but only abnormal results are displayed) Labs Reviewed  BASIC METABOLIC PANEL - Abnormal; Notable for the following components:      Result Value   Glucose, Bld 142 (*)    Calcium 8.6 (*)    All other components within normal limits  CBC  TROPONIN I (HIGH SENSITIVITY)  TROPONIN I (HIGH SENSITIVITY)   ____________________________________________   EKG  Interpreted by me Sinus bradycardia, rate of 46.  Normal axis.  First-degree AV block.  Right bundle branch block.  No acute ischemic changes.  ____________________________________________    [id number]  DG Chest 2 View  Result Date: 07/28/2021 CLINICAL DATA:  Chest pain EXAM: CHEST - 2 VIEW COMPARISON:  Chest x-ray 10/17/2019 FINDINGS: Cardiac surgical changes overlie the mediastinum. The heart and mediastinal contours are  within normal limits. Incidentally noted azygous fissure. No focal consolidation. No pulmonary edema. No pleural effusion.  No pneumothorax. No acute osseous abnormality. IMPRESSION: No active cardiopulmonary disease. Electronically Signed   By: Iven Finn M.D.   On: 07/28/2021 15:23    ____________________________________________   PROCEDURES Procedures  ____________________________________________  DIFFERENTIAL DIAGNOSIS   GERD, gastritis,  non-STEMI, pneumothorax, pneumonia  CLINICAL IMPRESSION / ASSESSMENT AND PLAN / ED COURSE  Medications ordered in the ED: Medications  alum & mag hydroxide-simeth (MAALOX/MYLANTA) 200-200-20 MG/5ML suspension 30 mL (30 mLs Oral Given 07/28/21 1805)  famotidine (PEPCID) tablet 40 mg (40 mg Oral Given 07/28/21 1805)    Pertinent labs & imaging results that were available during my care of the patient were reviewed by me and considered in my medical decision making (see chart for details).  Anthony Craig was evaluated in Emergency Department on 07/28/2021 for the symptoms described in the history of present illness. He was evaluated in the context of the global COVID-19 pandemic, which necessitated consideration that the patient might be at risk for infection with the SARS-CoV-2 virus that causes COVID-19. Institutional protocols and algorithms that pertain to the evaluation of patients at risk for COVID-19 are in a state of rapid change based on information released by regulatory bodies including the CDC and federal and state organizations. These policies and algorithms were followed during the patient's care in the ED.   Patient presents with epigastric pain most likely due to GERD/gastritis.  Combination of stress and insufficient oral intake today.  Chest x-ray viewed and interpreted by me, unremarkable.  Radiology report reviewed.  Serum labs unremarkable, serial troponins normal.  Vital signs remained stable, he is asymptomatic in the ED, stable for discharge.  Trial of antacids at home.  Abdomen is benign.  Doubt pancreatitis, bowel obstruction,  perforation, intra-abdominal abscess, volvulus, mesenteric ischemia, dissection, aneurysm..      ____________________________________________   FINAL CLINICAL IMPRESSION(S) / ED DIAGNOSES    Final diagnoses:  Epigastric pain  Type 2 diabetes mellitus without complication, with long-term current use of insulin [facility])     ED Discharge Orders          Ordered    famotidine (PEPCID) 20 MG tablet  2 times daily        07/28/21 1851            Portions of this note were generated with dragon dictation software. Dictation errors may occur despite best attempts at proofreading.    Carrie Mew, MD 07/28/21 [phone]

## 2021-07-28 NOTE — Discharge Instructions (Addendum)
Your evaluation in the emergency department today was reassuring.  Please follow-up with your doctor for continued monitoring of your symptoms.  Try taking Pepcid 2 times a day for the next week to see if this prevents recurrence of your symptoms.

## 2021-07-28 NOTE — ED Notes (Signed)
Patient & visitor voice concerns that patient is tremulous. Pt noted to be in hallway bed next to ambulance bay. MD notified. Pt normothermic at 98.7 oral. Given snack to discharge with. Taken in [facility] to friends truck. Pt able to ambulate sans complications.

## 2021-07-30 ENCOUNTER — Telehealth: Payer: Self-pay

## 2021-07-30 NOTE — Telephone Encounter (Signed)
Patient was scheduled on 08/05/2021.

## 2021-07-30 NOTE — Telephone Encounter (Signed)
Copied from [place] [id number]. Topic: General - Other >> Jul 29, 2021  4:02 PM Yvette Rack wrote: Reason for CRM: Pt requests hospital follow up appt with Dr. Rosanna Randy. Pt requests call back. Cb# [phone]

## 2021-08-05 ENCOUNTER — Ambulatory Visit: Payer: Self-pay | Admitting: *Deleted

## 2021-08-05 ENCOUNTER — Inpatient Hospital Stay: Payer: Medicare PPO | Admitting: Family Medicine

## 2021-08-05 NOTE — Telephone Encounter (Signed)
Pt stated he had written down the wrong time for his appt today 08/05/21 but he still needs to see Dr. Rosanna Craig for hospital fu appt because it was due to chest pain. Pt requests call back as next available hospital fu appt is far out. Cb# [phone].   Information taken by agent-no contact with the patient by the Triage Nurse.   Routing to office for contact with patient.   Reason for Disposition . [1] Follow-up call to recent contact AND [2] information only call, no triage required  Answer Assessment - Initial Assessment Questions 1. REASON FOR CALL or QUESTION: "What is your reason for calling today?" or "How can I best help you?" or "What question do you have that I can help answer?"     Patient missed hospital follow-up appointment today and needs to reschedule sooner than NT found appointment.  Protocols used: Information Only Call - No Triage-A-AH

## 2021-08-05 NOTE — Progress Notes (Deleted)
Established patient visit   Patient: Anthony Craig   DOB: 01/09/1928   87 y.o. Male  MRN: [id number] Visit Date: 08/05/2021  Today's healthcare provider: Wilhemena Durie, MD   No chief complaint on file.  Subjective    HPI  Patient is a 87 year old male who presents today for follow up after presenting to the ED on 07/28/21 with chest pain.   Troponin x 2 were both normal. Chest x-ray revealed no active cardiopulmonary disease.  EKG was interpreted as Sinus bradycardia, rate of 46.  Normal axis.  First-degree AV block.  Right bundle branch block.  No acute ischemic changes. Differential diagnosis was GERD, gastritis,  non-STEMI, pneumothorax, pneumonia.  Final diagnosis was Epigastric pain.   Patient was treated with Famotidine.    Medications: Outpatient Medications Prior to Visit  Medication Sig   ACCU-CHEK SMARTVIEW test strip CHECK SUGAR THREE TIMES DAILY   Alcohol Swabs (B-D SINGLE USE SWABS REGULAR) PADS USE AS DIRECTED  WITH  EACH  FINGERSTICK   aspirin EC 81 MG tablet Take 81 mg by mouth daily.   atorvastatin (LIPITOR) 40 MG tablet TAKE 1 TABLET BY MOUTH EVERYDAY AT BEDTIME   b complex vitamins tablet Take 1 tablet by mouth daily.   calcium-vitamin D 250-100 MG-UNIT per tablet Take 1 tablet by mouth daily.   cholecalciferol (VITAMIN D) 1000 UNITS tablet Take 1,000 Units by mouth daily.   Coenzyme Q10 (COQ10) 200 MG CAPS Take 100 mg by mouth daily.    diphenhydrAMINE (BENADRYL) 25 MG tablet Take 25 mg by mouth every 6 (six) hours as needed.   diphenoxylate-atropine (LOMOTIL) 2.5-0.025 MG per tablet Take 1 tablet by mouth 4 (four) times daily as needed for diarrhea or loose stools.   erythromycin ophthalmic ointment Place a 1/2 inch ribbon of ointment into the lower eyelid.   famotidine (PEPCID) 20 MG tablet Take 1 tablet (20 mg total) by mouth 2 (two) times daily.   fluocinonide (LIDEX) 0.05 % external solution Apply 1 application topically daily. To  affected areas of scalp    Glucosamine-Chondroitin (OSTEO BI-FLEX REGULAR STRENGTH PO) Take 1 tablet by mouth in the morning and at bedtime.   HYDROcodone-acetaminophen (NORCO/VICODIN) 5-325 MG tablet Take 1-2 tablets by mouth every 4 (four) hours as needed for moderate pain. (Patient not taking: No sig reported)   hydrocortisone 2.5 % lotion Apply 1 application topically 2 (two) times daily. To affected areas of face only as needed for flares of itching/scaling    hydroxypropyl methylcellulose / hypromellose (ISOPTO TEARS / GONIOVISC) 2.5 % ophthalmic solution Place 1 drop into both eyes 4 (four) times daily as needed for dry eyes.   insulin aspart protamine - aspart (NOVOLOG MIX 70/30 FLEXPEN) (70-30) 100 UNIT/ML FlexPen 12 units in the am and 6 units after dinner.   [place] Oil 500 MG CAPS Take 1 capsule by mouth daily. [place]   lipase/protease/amylase (CREON) 36000 UNITS CPEP capsule Take 36,000 Units by mouth.   Misc Natural Products (OSTEO BI-FLEX ADV JOINT SHIELD PO) Take by mouth 2 (two) times daily.   Multiple Vitamins-Minerals (MULTIVITAMIN PO) Take 1 tablet by mouth daily.   Multiple Vitamins-Minerals (PRESERVISION AREDS PO) Take by mouth.    omega-3 acid ethyl esters (LOVAZA) 1 g capsule Take by mouth 2 (two) times daily.    ONETOUCH VERIO test strip CHECK BLOOD SUGAR 3 TIMES A DAY (DX E11.9)   oxybutynin (DITROPAN XL) 5 MG 24 hr tablet Take 1 tablet (  5 mg total) by mouth at bedtime.   pantoprazole (PROTONIX) 40 MG tablet TAKE 1 TABLET BY MOUTH EVERY DAY   primidone (MYSOLINE) 50 MG tablet Take 1 tablet (50 mg total) by mouth 4 (four) times daily. (Patient taking differently: Take 50 mg by mouth at bedtime.)   SODIUM CHLORIDE, GU IRRIGANT, IR Irrigate with as directed. Irrigant every day flush drain once daily with 10 ML saline solution    ursodiol (ACTIGALL) 300 MG capsule Take 300 mg by mouth 2 (two) times daily.   vitamin B-12 (CYANOCOBALAMIN) 1000 MCG tablet Take 5,000 mcg by mouth  daily.    No facility-administered medications prior to visit.    Review of Systems  {Labs  Heme  Chem  Endocrine  Serology  Results Review (optional):23779}   Objective    There were no vitals taken for this visit. {Show previous vital signs (optional):23777}  Physical Exam  ***  No results found for any visits on 08/05/21.  Assessment & Plan     ***  No follow-ups on file.      {provider attestation***:1}   Wilhemena Durie, MD  [facility] [phone] (phone) [phone] (fax)  [place]

## 2021-08-07 ENCOUNTER — Encounter: Payer: Self-pay | Admitting: Family Medicine

## 2021-08-07 ENCOUNTER — Other Ambulatory Visit: Payer: Self-pay

## 2021-08-07 ENCOUNTER — Ambulatory Visit: Payer: Medicare PPO | Admitting: Family Medicine

## 2021-08-07 VITALS — BP 130/63 | HR 74 | Temp 98.0°F | Resp 16 | Wt 148.0 lb

## 2021-08-07 DIAGNOSIS — Z8546 Personal history of malignant neoplasm of prostate: Secondary | ICD-10-CM

## 2021-08-07 DIAGNOSIS — Z951 Presence of aortocoronary bypass graft: Secondary | ICD-10-CM | POA: Diagnosis not present

## 2021-08-07 DIAGNOSIS — K739 Chronic hepatitis, unspecified: Secondary | ICD-10-CM

## 2021-08-07 DIAGNOSIS — Z794 Long term (current) use of insulin: Secondary | ICD-10-CM

## 2021-08-07 DIAGNOSIS — F411 Generalized anxiety disorder: Secondary | ICD-10-CM | POA: Diagnosis not present

## 2021-08-07 DIAGNOSIS — F329 Major depressive disorder, single episode, unspecified: Secondary | ICD-10-CM

## 2021-08-07 DIAGNOSIS — Z8507 Personal history of malignant neoplasm of pancreas: Secondary | ICD-10-CM

## 2021-08-07 DIAGNOSIS — I25708 Atherosclerosis of coronary artery bypass graft(s), unspecified, with other forms of angina pectoris: Secondary | ICD-10-CM

## 2021-08-07 DIAGNOSIS — K861 Other chronic pancreatitis: Secondary | ICD-10-CM

## 2021-08-07 DIAGNOSIS — E1159 Type 2 diabetes mellitus with other circulatory complications: Secondary | ICD-10-CM

## 2021-08-07 MED ORDER — SERTRALINE HCL 25 MG PO TABS: 25.0000 mg | ORAL_TABLET | Freq: Every day | ORAL | 4 refills | Status: DC

## 2021-08-07 NOTE — Progress Notes (Signed)
I,April Miller,acting as a scribe for Wilhemena Durie, MD.,have documented all relevant documentation on the behalf of Wilhemena Durie, MD,as directed by  Wilhemena Durie, MD while in the presence of Wilhemena Durie, MD.   Established patient visit   Patient: Anthony Craig   DOB: September 19, 1928   87 y.o. Male  MRN: [id number] Visit Date: 08/07/2021  Today's healthcare provider: Wilhemena Durie, MD   Chief Complaint  Patient presents with   Follow-up   ER   Subjective    HPI  Patient is a 87 year old male who presents for follow up after being seen in the ER on 07/28/21.  He was ultimately diagnosed with epigastric pain and treated with Famotadine. Has been doing fairly well with his abdominal pain on omeprazole but he does have ongoing back pain with OA and DDD followed at the [place] and has been worked up with an MRI recently.  Patient states he is only slightly improved. Patient states he has not had pain since, however he has had some discomfort.  Bigger issue of late she has been married for more than 20 years and his wife has progressive dementia.  89 and is having progressive problems taking care of her.  PHQ-9 today is 4 and GAD-7 is 4   Medications: Outpatient Medications Prior to Visit  Medication Sig   Alcohol Swabs (B-D SINGLE USE SWABS REGULAR) PADS USE AS DIRECTED  WITH  EACH  FINGERSTICK   aspirin EC 81 MG tablet Take 81 mg by mouth daily.   atorvastatin (LIPITOR) 40 MG tablet TAKE 1 TABLET BY MOUTH EVERYDAY AT BEDTIME (Patient taking differently: 20 mg.)   b complex vitamins tablet Take 1 tablet by mouth daily.   calcium-vitamin D 250-100 MG-UNIT per tablet Take 1 tablet by mouth daily.   cholecalciferol (VITAMIN D) 1000 UNITS tablet Take 1,000 Units by mouth daily.   Coenzyme Q10 (COQ10) 200 MG CAPS Take 100 mg by mouth daily.    diphenhydrAMINE (BENADRYL) 25 MG tablet Take 25 mg by mouth every 6 (six) hours as needed.    diphenoxylate-atropine (LOMOTIL) 2.5-0.025 MG per tablet Take 1 tablet by mouth 4 (four) times daily as needed for diarrhea or loose stools.   famotidine (PEPCID) 20 MG tablet Take 1 tablet (20 mg total) by mouth 2 (two) times daily.   Glucosamine-Chondroitin (OSTEO BI-FLEX REGULAR STRENGTH PO) Take 1 tablet by mouth in the morning and at bedtime.   hydrocortisone 2.5 % lotion Apply 1 application topically 2 (two) times daily. To affected areas of face only as needed for flares of itching/scaling    Misc Natural Products (OSTEO BI-FLEX ADV JOINT SHIELD PO) Take by mouth 2 (two) times daily.   Multiple Vitamins-Minerals (MULTIVITAMIN PO) Take 1 tablet by mouth daily.   Multiple Vitamins-Minerals (PRESERVISION AREDS PO) Take by mouth.    omega-3 acid ethyl esters (LOVAZA) 1 g capsule Take by mouth 2 (two) times daily.    pantoprazole (PROTONIX) 40 MG tablet TAKE 1 TABLET BY MOUTH EVERY DAY   primidone (MYSOLINE) 50 MG tablet Take 1 tablet (50 mg total) by mouth 4 (four) times daily. (Patient taking differently: Take 50 mg by mouth at bedtime.)   ursodiol (ACTIGALL) 300 MG capsule Take 300 mg by mouth 2 (two) times daily.   vitamin B-12 (CYANOCOBALAMIN) 1000 MCG tablet Take 5,000 mcg by mouth daily.    HYDROcodone-acetaminophen (NORCO/VICODIN) 5-325 MG tablet Take 1-2 tablets by mouth every 4 (  four) hours as needed for moderate pain. (Patient not taking: No sig reported)   hydroxypropyl methylcellulose / hypromellose (ISOPTO TEARS / GONIOVISC) 2.5 % ophthalmic solution Place 1 drop into both eyes 4 (four) times daily as needed for dry eyes.   insulin aspart protamine - aspart (NOVOLOG MIX 70/30 FLEXPEN) (70-30) 100 UNIT/ML FlexPen 12 units in the am and 6 units after dinner. (Patient not taking: Reported on 08/07/2021)   Krill Oil 500 MG CAPS Take 1 capsule by mouth daily. Joline Salt (Patient not taking: Reported on 08/07/2021)   lipase/protease/amylase (CREON) 36000 UNITS CPEP capsule Take 36,000 Units by  mouth.   [DISCONTINUED] ACCU-CHEK SMARTVIEW test strip CHECK SUGAR THREE TIMES DAILY (Patient not taking: Reported on 08/07/2021)   [DISCONTINUED] erythromycin ophthalmic ointment Place a 1/2 inch ribbon of ointment into the lower eyelid. (Patient not taking: Reported on 08/07/2021)   [DISCONTINUED] fluocinonide (LIDEX) 0.05 % external solution Apply 1 application topically daily. To affected areas of scalp  (Patient not taking: Reported on 08/07/2021)   [DISCONTINUED] ONETOUCH VERIO test strip CHECK BLOOD SUGAR 3 TIMES A DAY (DX E11.9) (Patient not taking: Reported on 08/07/2021)   [DISCONTINUED] oxybutynin (DITROPAN XL) 5 MG 24 hr tablet Take 1 tablet (5 mg total) by mouth at bedtime. (Patient not taking: Reported on 08/07/2021)   [DISCONTINUED] SODIUM CHLORIDE, GU IRRIGANT, IR Irrigate with as directed. Irrigant every day flush drain once daily with 10 ML saline solution  (Patient not taking: Reported on 08/07/2021)   No facility-administered medications prior to visit.    Review of Systems      Objective    BP 130/63 (BP Location: Left Arm, Patient Position: Sitting, Cuff Size: Normal)   Pulse 74   Temp 98 F (36.7 C) (Oral)   Resp 16   Wt 148 lb (67.1 kg)   SpO2 96%   BMI 20.94 kg/m  BP Readings from Last 3 Encounters:  08/07/21 130/63  07/28/21 135/81  12/10/20 95/69   Wt Readings from Last 3 Encounters:  08/07/21 148 lb (67.1 kg)  12/10/20 145 lb (65.8 kg)  08/09/20 151 lb (68.5 kg)      Physical Exam Vitals reviewed.  Constitutional:      Comments: Cachectic white male in no acute distress.  HENT:     Head: Normocephalic and atraumatic.     Right Ear: External ear normal.     Left Ear: External ear normal.  Eyes:     General: No scleral icterus.    Conjunctiva/sclera: Conjunctivae normal.  Cardiovascular:     Rate and Rhythm: Normal rate and regular rhythm.     Heart sounds: Normal heart sounds.  Pulmonary:     Effort: Pulmonary effort is normal.     Breath  sounds: Normal breath sounds.  Abdominal:     Palpations: Abdomen is soft.     Tenderness: There is no abdominal tenderness.  Musculoskeletal:     Right lower leg: No edema.     Left lower leg: No edema.  Skin:    General: Skin is warm and dry.  Neurological:     Mental Status: He is alert and oriented to person, place, and time. Mental status is at baseline.  Psychiatric:        Mood and Affect: Mood normal.        Behavior: Behavior normal.        Thought Content: Thought content normal.        Judgment: Judgment normal.     Depression  screen [facility] 2/9 08/07/2021 06/13/2020 06/07/2019 02/16/2019 06/03/2018  Decreased Interest 0 0 0 0 0  Down, Depressed, Hopeless 2 0 0 0 1  PHQ - 2 Score 2 0 0 0 1  Altered sleeping 1 - - - -  Tired, decreased energy 1 - - - -  Change in appetite 0 - - - -  Feeling bad or failure about yourself  0 - - - -  Trouble concentrating 0 - - - -  Moving slowly or fidgety/restless 0 - - - -  Suicidal thoughts 0 - - - -  PHQ-9 Score 4 - - - -  Difficult doing work/chores Not difficult at all - - - -   GAD 7 : Generalized Anxiety Score 08/07/2021  Nervous, Anxious, on Edge 1  Control/stop worrying 1  Worry too much - different things 1  Trouble relaxing 0  Restless 0  Easily annoyed or irritable 1  Afraid - awful might happen 0  Total GAD 7 Score 4  Anxiety Difficulty Somewhat difficult     No results found for any visits on 08/07/21.  Assessment & Plan     1. GAD (generalized anxiety disorder) Try low-dose sertraline.  Follow-up in 3 to 4 months - sertraline (ZOLOFT) 25 MG tablet; Take 1 tablet (25 mg total) by mouth daily.  Dispense: 30 tablet; Refill: 4  2. Reactive depression Think this is an relation to his wife's failing health and the need to care for her  3. History of prostate cancer   4. Hx of CABG All risk factors treated  5. History of pancreatic cancer Status post successful Whipple procedure  about 13 years ago  6. Chronic  hepatitis(HCC)   7. Type 2 diabetes mellitus with other circulatory complication, with long-term current use of insulin ([place])   8. Chronic pancreatitis, unspecified pancreatitis type ([place])   9. Coronary artery disease of bypass graft of native heart with stable angina pectoris ([place]) Risk factors treated.   Return in about 4 months (around 12/05/2021).      I, Wilhemena Durie, MD, have reviewed all documentation for this visit. The documentation on 08/11/21 for the exam, diagnosis, procedures, and orders are all accurate and complete.    Anapaola Kinsel Cranford Mon, MD  [facility] [phone] (phone) [phone] (fax)  [place]

## 2021-08-09 ENCOUNTER — Telehealth: Payer: Self-pay

## 2021-08-09 NOTE — Progress Notes (Signed)
Chronic Care Management Pharmacy Assistant   Name: Anthony Craig  MRN: [id number] DOB: 1927-12-26   Reason for Encounter:Diabetes Disease State Call.   Recent office visits:  08/07/2021 Dr. Rosanna Randy MD (PCP) start sertraline 25 mg daily,return in 4 months.  Recent consult visits:  07/22/2021 Scherrie Bateman MD [facility]) No Medication Changes noted 06/06/2021 Mickle Mallory MD [facility]) Unable to see note  Hospital visits:  Medication Reconciliation was completed by comparing discharge summary, patient's EMR and Pharmacy list, and upon discussion with patient.  Admitted to the hospital on 07/28/2021 due to Epigastric pain. Discharge date was 07/28/2021. Discharged from [place]?Medications Started at [facility] Discharge:?? -started famotidine 20 mg 2 time daily  Medication Changes at Hospital Discharge: -Changed None  Medications Discontinued at Hospital Discharge: -Stopped None  Medications that remain the same after Hospital Discharge:??  -All other medications will remain the same.    Medications: Outpatient Encounter Medications as of 08/09/2021  Medication Sig Note   Alcohol Swabs (B-D SINGLE USE SWABS REGULAR) PADS USE AS DIRECTED  WITH  EACH  FINGERSTICK    aspirin EC 81 MG tablet Take 81 mg by mouth daily.    atorvastatin (LIPITOR) 40 MG tablet TAKE 1 TABLET BY MOUTH EVERYDAY AT BEDTIME (Patient taking differently: 20 mg.)    b complex vitamins tablet Take 1 tablet by mouth daily.    calcium-vitamin D 250-100 MG-UNIT per tablet Take 1 tablet by mouth daily.    cholecalciferol (VITAMIN D) 1000 UNITS tablet Take 1,000 Units by mouth daily.    Coenzyme Q10 (COQ10) 200 MG CAPS Take 100 mg by mouth daily.     diphenhydrAMINE (BENADRYL) 25 MG tablet Take 25 mg by mouth every 6 (six) hours as needed. 11/10/2016: prn   diphenoxylate-atropine (LOMOTIL) 2.5-0.025 MG per tablet Take 1 tablet by mouth 4 (four) times daily as  needed for diarrhea or loose stools.    famotidine (PEPCID) 20 MG tablet Take 1 tablet (20 mg total) by mouth 2 (two) times daily.    Glucosamine-Chondroitin (OSTEO BI-FLEX REGULAR STRENGTH PO) Take 1 tablet by mouth in the morning and at bedtime.    HYDROcodone-acetaminophen (NORCO/VICODIN) 5-325 MG tablet Take 1-2 tablets by mouth every 4 (four) hours as needed for moderate pain. (Patient not taking: No sig reported)    hydrocortisone 2.5 % lotion Apply 1 application topically 2 (two) times daily. To affected areas of face only as needed for flares of itching/scaling     hydroxypropyl methylcellulose / hypromellose (ISOPTO TEARS / GONIOVISC) 2.5 % ophthalmic solution Place 1 drop into both eyes 4 (four) times daily as needed for dry eyes.    insulin aspart protamine - aspart (NOVOLOG MIX 70/30 FLEXPEN) (70-30) 100 UNIT/ML FlexPen 12 units in the am and 6 units after dinner. (Patient not taking: Reported on 08/07/2021)    Krill Oil 500 MG CAPS Take 1 capsule by mouth daily. Joline Salt (Patient not taking: Reported on 08/07/2021)    lipase/protease/amylase (CREON) 36000 UNITS CPEP capsule Take 36,000 Units by mouth.    Misc Natural Products (OSTEO BI-FLEX ADV JOINT SHIELD PO) Take by mouth 2 (two) times daily.    Multiple Vitamins-Minerals (MULTIVITAMIN PO) Take 1 tablet by mouth daily.    Multiple Vitamins-Minerals (PRESERVISION AREDS PO) Take by mouth.     omega-3 acid ethyl esters (LOVAZA) 1 g capsule Take by mouth 2 (two) times daily.     pantoprazole (PROTONIX) 40 MG tablet TAKE 1 TABLET  BY MOUTH EVERY DAY    primidone (MYSOLINE) 50 MG tablet Take 1 tablet (50 mg total) by mouth 4 (four) times daily. (Patient taking differently: Take 50 mg by mouth at bedtime.)    sertraline (ZOLOFT) 25 MG tablet Take 1 tablet (25 mg total) by mouth daily.    ursodiol (ACTIGALL) 300 MG capsule Take 300 mg by mouth 2 (two) times daily.    vitamin B-12 (CYANOCOBALAMIN) 1000 MCG tablet Take 5,000 mcg by mouth daily.      No facility-administered encounter medications on file as of 08/09/2021.    Care Gaps: FOOT EXAM URINE MICROALBUMIN COVID-19 Vaccine Booster 4 OPHTHALMOLOGY EXAM (last completed 04/24/2020) Hemoglobin A1C (Last Completed 12/10/2020)   Star Rating Drugs: Atorvastatin 40 mg last filled on 10/11/2019 for a 90-Day supply with [facility] (patient also does go to the [place] so it's possible he is getting his prescriptions from there pharmacy)  Recent Relevant Labs: Lab Results  Component Value Date/Time   HGBA1C 8.2 (A) 12/10/2020 09:41 AM   HGBA1C 8.4 (H) 07/16/2020 09:15 AM   HGBA1C 7.6 (A) 03/13/2020 02:11 PM   HGBA1C 7.6 (H) 11/23/2019 03:52 PM   HGBA1C 7.4 03/09/2017 12:00 AM   HGBA1C 9.2 11/08/2015 12:00 AM    Kidney Function Lab Results  Component Value Date/Time   CREATININE 0.83 07/28/2021 04:13 PM   CREATININE 0.96 07/16/2020 09:15 AM   CREATININE 1.10 01/17/2014 04:13 AM   CREATININE 1.04 01/16/2014 02:01 AM   GFRNONAA >60 07/28/2021 04:13 PM   GFRNONAA >60 01/17/2014 04:13 AM   GFRAA 80 07/16/2020 09:15 AM   GFRAA >60 01/17/2014 04:13 AM    Current antihyperglycemic regimen:  Novolog 70/30 12 units AM, 6 units PM, will give extra 4 units if sugars are elevated.  What recent interventions/DTPs have been made to improve glycemic control:  Patient reports his Endocrinologist inform him the reason why he would have elevated blood sugar after he exercise.Patient states per endocrinologist he would burn all his carbohydrates during exercising , and the body would send a signal causing the pancreas needing more carbohydrates that would increase his blood sugar. Have there been any recent hospitalizations or ED visits since last visit with CPP? Yes Patient states he was having episodes of pain in his stomach that radiated to his chest.Patient reports the providers inform him all lab work was normal, and his symptoms could be related to Anxiety.Patient states his symptoms has  improved. Patient denies hypoglycemic symptoms, including Pale, Sweaty, Shaky, Hungry, Nervous/irritable, and Vision changes Patient denies hyperglycemic symptoms, including blurry vision, excessive thirst, fatigue, polyuria, and weakness How often are you checking your blood sugar?Use Dexcom What are your blood sugars ranging?  Patient states his blood sugar has been ranging around 140 with the high of 190. During the week, how often does your blood glucose drop below 70? Never Are you checking your feet daily/regularly?    Patient denies numbness,pain, or tingling sensations in his feet.  Reviewed chart prior to disease state call. Spoke with patient regarding BP  Recent Office Vitals: BP Readings from Last 3 Encounters:  08/07/21 130/63  07/28/21 135/81  12/10/20 95/69   Pulse Readings from Last 3 Encounters:  08/07/21 74  07/28/21 (!) 50  12/10/20 74    Wt Readings from Last 3 Encounters:  08/07/21 148 lb (67.1 kg)  12/10/20 145 lb (65.8 kg)  08/09/20 151 lb (68.5 kg)     Kidney Function Lab Results  Component Value Date/Time   CREATININE  0.83 07/28/2021 04:13 PM   CREATININE 0.96 07/16/2020 09:15 AM   CREATININE 1.10 01/17/2014 04:13 AM   CREATININE 1.04 01/16/2014 02:01 AM   GFRNONAA >60 07/28/2021 04:13 PM   GFRNONAA >60 01/17/2014 04:13 AM   GFRAA 80 07/16/2020 09:15 AM   GFRAA >60 01/17/2014 04:13 AM    BMP Latest Ref Rng & Units 07/28/2021 07/16/2020 11/23/2019  Glucose 70 - 99 mg/dL 142(H) 144(H) 92  BUN 8 - 23 mg/dL 18 13 20   Creatinine 0.61 - 1.24 mg/dL 0.83 0.96 1.02  BUN/Creat Ratio 10 - 24 - 14 20  Sodium 135 - 145 mmol/L 135 138 138  Potassium 3.5 - 5.1 mmol/L 4.7 4.3 4.7  Chloride 98 - 111 mmol/L 98 100 101  CO2 22 - 32 mmol/L 26 26 22   Calcium 8.9 - 10.3 mg/dL 8.6(L) 9.5 9.7    Current antihypertensive regimen:  None ID How often are you checking your Blood Pressure?  Patient states he has not been checking his blood pressure at home. Current  home BP readings: None ID What recent interventions/DTPs have been made by any provider to improve Blood Pressure control since last CPP Visit: None ID Any recent hospitalizations or ED visits since last visit with CPP? Yes What diet changes have been made to improve Blood Pressure Control?  Patient states he is trying to eat as healthy as he can. What exercise is being done to improve your Blood Pressure Control?  Patient reports he is exercising 3x weekly at the gym.Cardio and weight lifting.   Patient states when he is finish exercising, he will join his gym buddies and grab a bite of eat.  Patient states his PCP started him on a low dose of sertraline 25 mg, but has not started yet.Patient reports he will start sertraline soon, and reports he receives his medications from the [place].  Patient states he was going to ask to get his A1C done at his last PCP follow up, but forgot to mention it.Inform patient I could reach out to schedule a lab appointment, but patient denies at this time.Patient states he does have a follow up with his Gastroenterologist at the [place] and will try to remember to ask to get his A1C completed.Inform patient I could change his Telephone appointment on 10/29/2021 to a office appointment with the clinical pharmacist, and if he hasn't had his A1C check he could be able to get it completed then.Patient states that would be a good idea.  Adherence Review: Is the patient currently on a STATIN medication? Yes Is the patient currently on ACE/ARB medication? No Does the patient have >5 day gap between last estimated fill dates? Yes   [place] Pharmacist Assistant [phone]

## 2021-08-11 DIAGNOSIS — K739 Chronic hepatitis, unspecified: Secondary | ICD-10-CM | POA: Insufficient documentation

## 2021-08-22 ENCOUNTER — Encounter: Payer: Self-pay | Admitting: Student in an Organized Health Care Education/Training Program

## 2021-08-22 ENCOUNTER — Ambulatory Visit
Payer: No Typology Code available for payment source | Attending: Student in an Organized Health Care Education/Training Program | Admitting: Student in an Organized Health Care Education/Training Program

## 2021-08-22 ENCOUNTER — Other Ambulatory Visit: Payer: Self-pay

## 2021-08-22 VITALS — BP 136/58 | HR 76 | Temp 97.1°F | Resp 16 | Ht 71.0 in | Wt 150.0 lb

## 2021-08-22 DIAGNOSIS — M47816 Spondylosis without myelopathy or radiculopathy, lumbar region: Secondary | ICD-10-CM

## 2021-08-22 DIAGNOSIS — M48062 Spinal stenosis, lumbar region with neurogenic claudication: Secondary | ICD-10-CM | POA: Insufficient documentation

## 2021-08-22 DIAGNOSIS — G894 Chronic pain syndrome: Secondary | ICD-10-CM

## 2021-08-22 DIAGNOSIS — M5136 Other intervertebral disc degeneration, lumbar region: Secondary | ICD-10-CM | POA: Diagnosis present

## 2021-08-22 DIAGNOSIS — M5416 Radiculopathy, lumbar region: Secondary | ICD-10-CM | POA: Insufficient documentation

## 2021-08-22 DIAGNOSIS — M51369 Other intervertebral disc degeneration, lumbar region without mention of lumbar back pain or lower extremity pain: Secondary | ICD-10-CM | POA: Insufficient documentation

## 2021-08-22 DIAGNOSIS — G8929 Other chronic pain: Secondary | ICD-10-CM

## 2021-08-22 MED ORDER — GABAPENTIN 100 MG PO CAPS: 100.0000 mg | ORAL_CAPSULE | Freq: Every day | ORAL | 0 refills | Status: DC

## 2021-08-22 NOTE — Patient Instructions (Signed)
Epidural Steroid Injection Patient Information  Description: The epidural space surrounds the nerves as they exit the spinal cord.  In some patients, the nerves can be compressed and inflamed by a bulging disc or a tight spinal canal (spinal stenosis).  By injecting steroids into the epidural space, we can bring irritated nerves into direct contact with a potentially helpful medication.  These steroids act directly on the irritated nerves and can reduce swelling and inflammation which often leads to decreased pain.  Epidural steroids may be injected anywhere along the spine and from the neck to the low back depending upon the location of your pain.   After numbing the skin with local anesthetic (like Novocaine), a small needle is passed into the epidural space slowly.  You may experience a sensation of pressure while this is being done.  The entire block usually last less than 10 minutes.  Conditions which may be treated by epidural steroids:  Low back and leg pain Neck and arm pain Spinal stenosis Post-laminectomy syndrome Herpes zoster (shingles) pain Pain from compression fractures  Preparation for the injection:  Do not eat any solid food or dairy products within 8 hours of your appointment.  You may drink clear liquids up to 3 hours before appointment.  Clear liquids include water, black coffee, juice or soda.  No milk or cream please. You may take your regular medication, including pain medications, with a sip of water before your appointment  Diabetics should hold regular insulin (if taken separately) and take 1/2 normal NPH dos the morning of the procedure.  Carry some sugar containing items with you to your appointment. A driver must accompany you and be prepared to drive you home after your procedure.  Bring all your current medications with your. An IV may be inserted and sedation may be given at the discretion of the physician.   A blood pressure cuff, EKG and other monitors will  often be applied during the procedure.  Some patients may need to have extra oxygen administered for a short period. You will be asked to provide medical information, including your allergies, prior to the procedure.  We must know immediately if you are taking blood thinners (like Coumadin/Warfarin)  Or if you are allergic to IV iodine contrast (dye). We must know if you could possible be pregnant.  Possible side-effects: Bleeding from needle site Infection (rare, may require surgery) Nerve injury (rare) Numbness & tingling (temporary) Difficulty urinating (rare, temporary) Spinal headache ( a headache worse with upright posture) Light -headedness (temporary) Pain at injection site (several days) Decreased blood pressure (temporary) Weakness in arm/leg (temporary) Pressure sensation in back/neck (temporary)  Call if you experience: Fever/chills associated with headache or increased back/neck pain. Headache worsened by an upright position. New onset weakness or numbness of an extremity below the injection site Hives or difficulty breathing (go to the emergency room) Inflammation or drainage at the infection site Severe back/neck pain Any new symptoms which are concerning to you  Please note:  Although the local anesthetic injected can often make your back or neck feel good for several hours after the injection, the pain will likely return.  It takes 3-7 days for steroids to work in the epidural space.  You may not notice any pain relief for at least that one week.  If effective, we will often do a series of three injections spaced 3-6 weeks apart to maximally decrease your pain.  After the initial series, we generally will wait several months before   considering a repeat injection of the same type.  If you have any questions, please call [phone] [facility] 

## 2021-08-22 NOTE — Progress Notes (Deleted)
Patient: Anthony Craig  Service Category: E/M  Provider: Gillis Santa, MD  DOB: 07-27-1928  DOS: 08/22/2021  Referring Provider: Jerrol Banana.,*  MRN: [id number]  Setting: Ambulatory outpatient  PCP: Jerrol Banana., MD  Type: New Patient  Specialty: Interventional Pain Management    Location: Office  Delivery: Face-to-face     Primary Reason(s) for Visit: Encounter for initial evaluation of one or more chronic problems (new to examiner) potentially causing chronic pain, and posing a threat to normal musculoskeletal function. (Level of risk: High) CC: Back Pain  HPI  Anthony Craig is an 87 y.o. year old, male patient, who comes for the first time to our practice referred by Jerrol Banana.,* for our initial evaluation of his chronic pain. He has Allergic drug reaction; B12 deficiency; Cervical nerve root disorder; Chest discomfort; Type 2 diabetes mellitus with other circulatory complication, with long-term current use of insulin ([place]); Vertigo; Benign essential tremor; Fatigue; Acid reflux; HLD (hyperlipidemia); BP (high blood pressure); Hernia, inguinal, left; Cannot sleep; Chronic pancreatitis, unspecified pancreatitis type ([place]); CA of prostate ([place]); Chest pain; Viral upper respiratory tract infection; and Chronic hepatitis ([place]) on their problem list. Today he comes in for evaluation of his Back Pain  Pain Assessment: Location: Lower, Mid Back Radiating: Denies however when "I take a funny step or go down a step i feel pain in front of right lef into the right knee" Onset: More than a month ago Duration: Chronic pain Quality: Constant, Dull Severity: 4 /10 (subjective, self-reported pain score)  Effect on ADL: "Standing makes pain worse, walking any distance, especially up hill it bothers me" Timing: Constant Modifying factors: Sitting in recliner or laying in bed BP: (!) 136/58  HR: 76  Onset and Duration: {Hx; Onset and Duration:210120511} Cause of pain:  {Hx; Cause:210120521} Severity: {Pain Severity:210120502} Timing: {Symptoms; Timing:210120501} Aggravating Factors: {Causes; Aggravating pain factors:210120507} Alleviating Factors: {Causes; Alleviating Factors:210120500} Associated Problems: {Hx; Associated problems:210120515} Quality of Pain: {Hx; Symptom quality or Descriptor:210120531} Previous Examinations or Tests: {Hx; Previous examinations or test:210120529} Previous Treatments: {Hx; Previous Treatment:210120503}  ***  Today I took the time to provide the patient with information regarding my pain practice. The patient was informed that my practice is divided into two sections: an interventional pain management section, as well as a completely separate and distinct medication management section. I explained that I have procedure days for my interventional therapies, and evaluation days for follow-ups and medication management. Because of the amount of documentation required during both, they are kept separated. This means that there is the possibility that he may be scheduled for a procedure on one day, and medication management the next. I have also informed him that because of staffing and facility limitations, I no longer take patients for medication management only. To illustrate the reasons for this, I gave the patient the example of surgeons, and how inappropriate it would be to refer a patient to his/her care, just to write for the post-surgical antibiotics on a surgery done by a different surgeon.   Because interventional pain management is my board-certified specialty, the patient was informed that joining my practice means that they are open to any and all interventional therapies. I made it clear that this does not mean that they will be forced to have any procedures done. What this means is that I believe interventional therapies to be essential part of the diagnosis and proper management of chronic pain conditions. Therefore,  patients not interested in these  interventional alternatives will be better served under the care of a different practitioner.  The patient was also made aware of my Comprehensive Pain Management Safety Guidelines where by joining my practice, they limit all of their nerve blocks and joint injections to those done by our practice, for as long as we are retained to manage their care.   Historic Controlled Substance Pharmacotherapy Review  PMP and historical list of controlled substances: ***  Current opioid analgesics: .   *** MME/day: *** mg/day   Historical Monitoring: The patient  reports no history of drug use. List of all UDS Test(s): No results found for: MDMA, COCAINSCRNUR, [place], [place], CANNABQUANT, [place], [place] List of other Serum/Urine Drug Screening Test(s):  No results found for: AMPHSCRSER, BARBSCRSER, BENZOSCRSER, COCAINSCRSER, COCAINSCRNUR, PCPSCRSER, PCPQUANT, THCSCRSER, THCU, CANNABQUANT, OPIATESCRSER, OXYSCRSER, PROPOXSCRSER, ETH Historical Background Evaluation: [place] PMP: PDMP not reviewed this encounter. Online review of the past 40-monthperiod conducted.             PMP NARX Score Report:  Narcotic: *** Sedative: *** Stimulant: *** [place] Department of public safety, offender search: (Editor, commissioningInformation) Non-contributory Risk Assessment Profile: Aberrant behavior: None observed or detected today Risk factors for fatal opioid overdose: None identified today PMP NARX Overdose Risk Score: *** Fatal overdose hazard ratio (HR): Calculation deferred Non-fatal overdose hazard ratio (HR): Calculation deferred Risk of opioid abuse or dependence: 0.7-3.0% with doses ? 36 MME/day and 6.1-26% with doses ? 120 MME/day. Substance use disorder (SUD) risk level: See below Personal History of Substance Abuse (SUD-Substance use disorder):  Alcohol: Negative  Illegal Drugs: Negative  Rx Drugs: Negative  ORT Risk Level calculation: Low Risk  Opioid Risk Tool - 08/22/21 0911        Family History of Substance Abuse   Alcohol Negative    Illegal Drugs Negative    Rx Drugs Negative      Personal History of Substance Abuse   Alcohol Negative    Illegal Drugs Negative    Rx Drugs Negative      Age   Age between 178-45years  No      Psychological Disease   Psychological Disease Negative    Depression Positive      Total Score   Opioid Risk Tool Scoring 1    Opioid Risk Interpretation Low Risk            ORT Scoring interpretation table:  Score <3 = Low Risk for SUD  Score between 4-7 = Moderate Risk for SUD  Score >8 = High Risk for Opioid Abuse   PHQ-2 Depression Scale:  Total score: 1  PHQ-2 Scoring interpretation table: (Score and probability of major depressive disorder)  Score 0 = No depression  Score 1 = 15.4% Probability  Score 2 = 21.1% Probability  Score 3 = 38.4% Probability  Score 4 = 45.5% Probability  Score 5 = 56.4% Probability  Score 6 = 78.6% Probability   PHQ-9 Depression Scale:  Total score: 1  PHQ-9 Scoring interpretation table:  Score 0-4 = No depression  Score 5-9 = Mild depression  Score 10-14 = Moderate depression  Score 15-19 = Moderately severe depression  Score 20-27 = Severe depression (2.4 times higher risk of SUD and 2.89 times higher risk of overuse)   Pharmacologic Plan: As per protocol, I have not taken over any controlled substance management, pending the results of ordered tests and/or consults.            Initial impression: Pending review of  available data and ordered tests.  Meds   Current Outpatient Medications:    Alcohol Swabs (B-D SINGLE USE SWABS REGULAR) PADS, USE AS DIRECTED  WITH  EACH  FINGERSTICK, Disp: 300 each, Rfl: 3   aspirin EC 81 MG tablet, Take 81 mg by mouth daily., Disp: , Rfl:    atorvastatin (LIPITOR) 40 MG tablet, TAKE 1 TABLET BY MOUTH EVERYDAY AT BEDTIME (Patient taking differently: 20 mg.), Disp: 90 tablet, Rfl: 3   b complex vitamins tablet, Take 1 tablet by mouth daily., Disp:  , Rfl:    calcium-vitamin D 250-100 MG-UNIT per tablet, Take 1 tablet by mouth daily., Disp: , Rfl:    cholecalciferol (VITAMIN D) 1000 UNITS tablet, Take 1,000 Units by mouth daily., Disp: , Rfl:    Coenzyme Q10 (COQ10) 200 MG CAPS, Take 100 mg by mouth daily. , Disp: , Rfl:    diphenhydrAMINE (BENADRYL) 25 MG tablet, Take 25 mg by mouth every 6 (six) hours as needed., Disp: , Rfl:    diphenoxylate-atropine (LOMOTIL) 2.5-0.025 MG per tablet, Take 1 tablet by mouth 4 (four) times daily as needed for diarrhea or loose stools., Disp: , Rfl:    famotidine (PEPCID) 20 MG tablet, Take 1 tablet (20 mg total) by mouth 2 (two) times daily., Disp: 60 tablet, Rfl: 0   Glucosamine-Chondroitin (OSTEO BI-FLEX REGULAR STRENGTH PO), Take 1 tablet by mouth in the morning and at bedtime., Disp: , Rfl:    hydrocortisone 2.5 % lotion, Apply 1 application topically 2 (two) times daily. To affected areas of face only as needed for flares of itching/scaling , Disp: , Rfl:    hydroxypropyl methylcellulose / hypromellose (ISOPTO TEARS / GONIOVISC) 2.5 % ophthalmic solution, Place 1 drop into both eyes 4 (four) times daily as needed for dry eyes., Disp: , Rfl:    insulin aspart protamine - aspart (NOVOLOG MIX 70/30 FLEXPEN) (70-30) 100 UNIT/ML FlexPen, 12 units in the am and 6 units after dinner., Disp: , Rfl:    Krill Oil 500 MG CAPS, Take 1 capsule by mouth daily. [place], Disp: , Rfl:    lipase/protease/amylase (CREON) 36000 UNITS CPEP capsule, Take 36,000 Units by mouth., Disp: , Rfl:    Misc Natural Products (OSTEO BI-FLEX ADV JOINT SHIELD PO), Take by mouth 2 (two) times daily., Disp: , Rfl:    Multiple Vitamins-Minerals (MULTIVITAMIN PO), Take 1 tablet by mouth daily., Disp: , Rfl:    Multiple Vitamins-Minerals (PRESERVISION AREDS PO), Take by mouth. , Disp: , Rfl:    omega-3 acid ethyl esters (LOVAZA) 1 g capsule, Take by mouth 2 (two) times daily. , Disp: , Rfl:    pantoprazole (PROTONIX) 40 MG tablet, TAKE 1  TABLET BY MOUTH EVERY DAY, Disp: 90 tablet, Rfl: 1   primidone (MYSOLINE) 50 MG tablet, Take 1 tablet (50 mg total) by mouth 4 (four) times daily. (Patient taking differently: Take 50 mg by mouth at bedtime.), Disp: 360 tablet, Rfl: 3   sertraline (ZOLOFT) 25 MG tablet, Take 1 tablet (25 mg total) by mouth daily., Disp: 30 tablet, Rfl: 4   ursodiol (ACTIGALL) 300 MG capsule, Take 300 mg by mouth 2 (two) times daily., Disp: , Rfl:    vitamin B-12 (CYANOCOBALAMIN) 1000 MCG tablet, Take 5,000 mcg by mouth daily. , Disp: , Rfl:    HYDROcodone-acetaminophen (NORCO/VICODIN) 5-325 MG tablet, Take 1-2 tablets by mouth every 4 (four) hours as needed for moderate pain. (Patient not taking: Reported on 06/18/2020), Disp: 100 tablet, Rfl: 0  Imaging Review  Cervical Imaging: Cervical MR wo contrast: No results found for this or any previous visit.  Cervical MR wo contrast: No valid procedures specified. Cervical MR w/wo contrast: No results found for this or any previous visit.  Cervical MR w contrast: No results found for this or any previous visit.  Cervical CT wo contrast: No results found for this or any previous visit.  Cervical CT w/wo contrast: No results found for this or any previous visit.  Cervical CT w/wo contrast: No results found for this or any previous visit.  Cervical CT w contrast: No results found for this or any previous visit.  Cervical CT outside: No results found for this or any previous visit.  Cervical DG 1 view: No results found for this or any previous visit.  Cervical DG 2-3 views: No results found for this or any previous visit.  Cervical DG F/E views: No results found for this or any previous visit.  Cervical DG 2-3 clearing views: No results found for this or any previous visit.  Cervical DG Bending/F/E views: No results found for this or any previous visit.  Cervical DG complete: No results found for this or any previous visit.  Cervical DG Myelogram views: No  results found for this or any previous visit.  Cervical DG Myelogram views: No results found for this or any previous visit.  Cervical Discogram views: No results found for this or any previous visit.   Shoulder Imaging: Shoulder-R MR w contrast: No results found for this or any previous visit.  Shoulder-L MR w contrast: No results found for this or any previous visit.  Shoulder-R MR w/wo contrast: No results found for this or any previous visit.  Shoulder-L MR w/wo contrast: No results found for this or any previous visit.  Shoulder-R MR wo contrast: No results found for this or any previous visit.  Shoulder-L MR wo contrast: No results found for this or any previous visit.  Shoulder-R CT w contrast: No results found for this or any previous visit.  Shoulder-L CT w contrast: No results found for this or any previous visit.  Shoulder-R CT w/wo contrast: No results found for this or any previous visit.  Shoulder-L CT w/wo contrast: No results found for this or any previous visit.  Shoulder-R CT wo contrast: No results found for this or any previous visit.  Shoulder-L CT wo contrast: No results found for this or any previous visit.  Shoulder-R DG Arthrogram: No results found for this or any previous visit.  Shoulder-L DG Arthrogram: No results found for this or any previous visit.  Shoulder-R DG 1 view: No results found for this or any previous visit.  Shoulder-L DG 1 view: No results found for this or any previous visit.  Shoulder-R DG: No results found for this or any previous visit.  Shoulder-L DG: No results found for this or any previous visit.   Thoracic Imaging: Thoracic MR wo contrast: No results found for this or any previous visit.  Thoracic MR wo contrast: No valid procedures specified. Thoracic MR w/wo contrast: No results found for this or any previous visit.  Thoracic MR w contrast: No results found for this or any previous visit.  Thoracic CT wo  contrast: No results found for this or any previous visit.  Thoracic CT w/wo contrast: No results found for this or any previous visit.  Thoracic CT w/wo contrast: No results found for this or any previous visit.  Thoracic CT w contrast: No results found for  this or any previous visit.  Thoracic DG 2-3 views: No results found for this or any previous visit.  Thoracic DG 4 views: No results found for this or any previous visit.  Thoracic DG: No results found for this or any previous visit.  Thoracic DG w/swimmers view: No results found for this or any previous visit.  Thoracic DG Myelogram views: No results found for this or any previous visit.  Thoracic DG Myelogram views: No results found for this or any previous visit.   Lumbosacral Imaging: Lumbar MR wo contrast: No results found for this or any previous visit.  Lumbar MR wo contrast: No valid procedures specified. Lumbar MR w/wo contrast: No results found for this or any previous visit.  Lumbar MR w/wo contrast: No results found for this or any previous visit.  Lumbar MR w contrast: No results found for this or any previous visit.  Lumbar CT wo contrast: No results found for this or any previous visit.  Lumbar CT w/wo contrast: No results found for this or any previous visit.  Lumbar CT w/wo contrast: No results found for this or any previous visit.  Lumbar CT w contrast: No results found for this or any previous visit.  Lumbar DG 1V: No results found for this or any previous visit.  Lumbar DG 1V (Clearing): No results found for this or any previous visit.  Lumbar DG 2-3V (Clearing): No results found for this or any previous visit.  Lumbar DG 2-3 views: No results found for this or any previous visit.  Lumbar DG (Complete) 4+V: No results found for this or any previous visit.        Lumbar DG F/E views: No results found for this or any previous visit.        Lumbar DG Bending views: No results found for this or  any previous visit.        Lumbar DG Myelogram views: No results found for this or any previous visit.  Lumbar DG Myelogram: No results found for this or any previous visit.  Lumbar DG Myelogram: No results found for this or any previous visit.  Lumbar DG Myelogram: No results found for this or any previous visit.  Lumbar DG Myelogram Lumbosacral: No results found for this or any previous visit.  Lumbar DG Diskogram views: No results found for this or any previous visit.  Lumbar DG Diskogram views: No results found for this or any previous visit.  Lumbar DG Epidurogram OP: No results found for this or any previous visit.  Lumbar DG Epidurogram IP: No valid procedures specified.  Sacroiliac Joint Imaging: Sacroiliac Joint DG: No results found for this or any previous visit.  Sacroiliac Joint MR w/wo contrast: No results found for this or any previous visit.  Sacroiliac Joint MR wo contrast: No results found for this or any previous visit.   Spine Imaging: Whole Spine DG Myelogram views: No results found for this or any previous visit.  Whole Spine MR Mets screen: No results found for this or any previous visit.  Whole Spine MR Mets screen: No results found for this or any previous visit.  Whole Spine MR w/wo: No results found for this or any previous visit.  MRA Spinal Canal w/ cm: No results found for this or any previous visit.  MRA Spinal Canal wo/ cm: No valid procedures specified. MRA Spinal Canal w/wo cm: No results found for this or any previous visit.  Spine Outside MR Films: No results found for  this or any previous visit.  Spine Outside CT Films: No results found for this or any previous visit.  CT-Guided Biopsy: No results found for this or any previous visit.  CT-Guided Needle Placement: No results found for this or any previous visit.  DG Spine outside: No results found for this or any previous visit.  IR Spine outside: No results found for this or any  previous visit.  NM Spine outside: No results found for this or any previous visit.   Hip Imaging: Hip-R MR w contrast: No results found for this or any previous visit.  Hip-L MR w contrast: No results found for this or any previous visit.  Hip-R MR w/wo contrast: No results found for this or any previous visit.  Hip-L MR w/wo contrast: No results found for this or any previous visit.  Hip-R MR wo contrast: No results found for this or any previous visit.  Hip-L MR wo contrast: No results found for this or any previous visit.  Hip-R CT w contrast: No results found for this or any previous visit.  Hip-L CT w contrast: No results found for this or any previous visit.  Hip-R CT w/wo contrast: No results found for this or any previous visit.  Hip-L CT w/wo contrast: No results found for this or any previous visit.  Hip-R CT wo contrast: No results found for this or any previous visit.  Hip-L CT wo contrast: No results found for this or any previous visit.  Hip-R DG 2-3 views: No results found for this or any previous visit.  Hip-L DG 2-3 views: No results found for this or any previous visit.  Hip-R DG Arthrogram: No results found for this or any previous visit.  Hip-L DG Arthrogram: No results found for this or any previous visit.  Hip-B DG Bilateral: No results found for this or any previous visit.   Knee Imaging: Knee-R MR w contrast: No results found for this or any previous visit.  Knee-L MR w/o contrast: No results found for this or any previous visit.  Knee-R MR w/wo contrast: No results found for this or any previous visit.  Knee-L MR w/wo contrast: No results found for this or any previous visit.  Knee-R MR wo contrast: No results found for this or any previous visit.  Knee-L MR wo contrast: No results found for this or any previous visit.  Knee-R CT w contrast: No results found for this or any previous visit.  Knee-L CT w contrast: No results found for  this or any previous visit.  Knee-R CT w/wo contrast: No results found for this or any previous visit.  Knee-L CT w/wo contrast: No results found for this or any previous visit.  Knee-R CT wo contrast: No results found for this or any previous visit.  Knee-L CT wo contrast: No results found for this or any previous visit.  Knee-R DG 1-2 views: No results found for this or any previous visit.  Knee-L DG 1-2 views: No results found for this or any previous visit.  Knee-R DG 3 views: No results found for this or any previous visit.  Knee-L DG 3 views: No results found for this or any previous visit.  Knee-R DG 4 views: No results found for this or any previous visit.  Knee-L DG 4 views: No results found for this or any previous visit.  Knee-R DG Arthrogram: No results found for this or any previous visit.  Knee-L DG Arthrogram: No results found for this  or any previous visit.   Ankle Imaging: Ankle-R DG Complete: No results found for this or any previous visit.  Ankle-L DG Complete: No results found for this or any previous visit.   Foot Imaging: Foot-R DG Complete: No results found for this or any previous visit.  Foot-L DG Complete: No results found for this or any previous visit.   Elbow Imaging: Elbow-R DG Complete: No results found for this or any previous visit.  Elbow-L DG Complete: No results found for this or any previous visit.   Wrist Imaging: Wrist-R DG Complete: No results found for this or any previous visit.  Wrist-L DG Complete: No results found for this or any previous visit.   Hand Imaging: Hand-R DG Complete: No results found for this or any previous visit.  Hand-L DG Complete: No results found for this or any previous visit.   Complexity Note: Imaging results reviewed. Results shared with Anthony Craig, using Layman's terms.                         ROS  Cardiovascular: {Hx; Cardiovascular History:210120525} Pulmonary or Respiratory: {Hx;  Pumonary and/or Respiratory History:210120523} Neurological: {Hx; Neurological:210120504} Psychological-Psychiatric: {Hx; Psychological-Psychiatric History:210120512} Gastrointestinal: {Hx; Gastrointestinal:210120527} Genitourinary: {Hx; Genitourinary:210120506} Hematological: {Hx; Hematological:210120510} Endocrine: {Hx; Endocrine history:210120509} Rheumatologic: {Hx; Rheumatological:210120530} Musculoskeletal: {Hx; Musculoskeletal:210120528} Work History: {Hx; Work history:210120514}  Allergies  Anthony Craig is allergic to doxycycline and hydrocodone bit-homatrop mbr.  Laboratory Chemistry Profile   Renal Lab Results  Component Value Date   BUN 18 07/28/2021   CREATININE 0.83 07/28/2021   BCR 14 07/16/2020   GFRAA 80 07/16/2020   GFRNONAA >60 07/28/2021   SPECGRAV 1.020 06/08/2019   PHUR 5.0 06/08/2019   PROTEINUR Negative 06/08/2019     Electrolytes Lab Results  Component Value Date   NA 135 07/28/2021   K 4.7 07/28/2021   CL 98 07/28/2021   CALCIUM 8.6 (L) 07/28/2021   MG 1.5 (L) 01/17/2014     Hepatic Lab Results  Component Value Date   AST 13 07/16/2020   ALT 12 07/16/2020   ALBUMIN 4.5 07/16/2020   ALKPHOS 93 07/16/2020   AMYLASE 50 09/10/2016   LIPASE 7 (L) 11/23/2019     ID No results found for: LYMEIGGIGMAB, HIV, SARSCOV2NAA, STAPHAUREUS, MRSAPCR, HCVAB, PREGTESTUR, RMSFIGG, QFVRPH1IGG, QFVRPH2IGG, LYMEIGGIGMAB   Bone No results found for: VD25OH, VD125OH2TOT, G2877219, [id number], 25OHVITD1, 25OHVITD2, 25OHVITD3, TESTOFREE, TESTOSTERONE   Endocrine Lab Results  Component Value Date   GLUCOSE 142 (H) 07/28/2021   GLUCOSEU Negative 01/16/2014   HGBA1C 8.2 (A) 12/10/2020   TSH 1.220 08/17/2019   FREET4 1.20 03/15/2015     Neuropathy Lab Results  Component Value Date   HGBA1C 8.2 (A) 12/10/2020     CNS No results found for: COLORCSF, APPEARCSF, RBCCOUNTCSF, WBCCSF, POLYSCSF, LYMPHSCSF, EOSCSF, PROTEINCSF, GLUCCSF, JCVIRUS, CSFOLI, IGGCSF,  LABACHR, ACETBL, LABACHR, ACETBL   Inflammation (CRP: Acute  ESR: Chronic) No results found for: CRP, ESRSEDRATE, LATICACIDVEN   Rheumatology No results found for: RF, ANA, LABURIC, URICUR, LYMEIGGIGMAB, LYMEABIGMQN, HLAB27   Coagulation Lab Results  Component Value Date   PLT 174 07/28/2021     Cardiovascular Lab Results  Component Value Date   TROPONINI <0.03 01/02/2019   HGB 14.7 07/28/2021   HCT 43.0 07/28/2021     Screening No results found for: SARSCOV2NAA, COVIDSOURCE, STAPHAUREUS, MRSAPCR, HCVAB, HIV, PREGTESTUR   Cancer Lab Results  Component Value Date   CEA 1.7 01/17/2014     Allergens No  results found for: ALMOND, APPLE, ASPARAGUS, AVOCADO, BANANA, BARLEY, BASIL, BAYLEAF, GREENBEAN, LIMABEAN, WHITEBEAN, BEEFIGE, REDBEET, BLUEBERRY, BROCCOLI, CABBAGE, MELON, CARROT, CASEIN, CASHEWNUT, CAULIFLOWER, CELERY     Note: Lab results reviewed.  PFSH  Drug: Anthony Craig  reports no history of drug use. Alcohol:  reports current alcohol use. Tobacco:  reports that he quit smoking about 41 years ago. His smoking use included cigarettes. He has a 15.00 pack-year smoking history. He has never used smokeless tobacco. Medical:  has a past medical history of Cancer ([place]), Coronary artery disease, Diabetes mellitus without complication ([place]), Hyperlipidemia, and Hypertension. Family: family history includes COPD in his brother; Cancer (age of onset: 46) in his mother; Diabetes in an other family member; Heart disease in his father and mother; Stroke in his brother.  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   Chest discomfort  02/08/2015   Type 2 diabetes mellitus with other circulatory complication, with long-term current use of insulin ([place])    Vertigo 02/08/2015   Benign essential tremor 02/08/2015   Fatigue 02/08/2015   Acid reflux 02/08/2015   HLD (hyperlipidemia) 02/08/2015   BP (high blood pressure) 02/08/2015   Hernia, inguinal, left 02/08/2015   Cannot sleep 02/08/2015   Chronic pancreatitis, unspecified pancreatitis type ([place])    CA of prostate ([place]) 02/08/2015   Chest pain 04/27/2015   Viral upper respiratory tract infection 11/28/2020   Chronic hepatitis ([place]) 08/11/2021   Resolved Ambulatory Problems    Diagnosis Date Noted   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 In no apparent acute distress Vitals:   08/22/21 0848  BP: (!) 136/58  Pulse: 76  Resp: 16  Temp: (!) 97.1 F (36.2 C)  TempSrc: Temporal  SpO2: 100%  Weight: 150 lb (68 kg)  Height: '5\' 11"'  (1.803 m)   BMI Assessment: Estimated body mass index is 20.92 kg/m as calculated from the following:   Height as of this encounter: '5\' 11"'  (1.803 m).   Weight as of this encounter: 150 lb (68 kg).  BMI interpretation table: BMI level Category Range association with higher incidence of chronic pain  <18 kg/m2 Underweight   18.5-24.9 kg/m2 Ideal body weight   25-29.9 kg/m2 Overweight Increased incidence by 20%  30-34.9 kg/m2 Obese (Class I) Increased incidence by 68%  35-39.9 kg/m2 Severe obesity (Class II) Increased incidence by 136%  >40 kg/m2 Extreme obesity (Class III) Increased incidence by 254%   Patient's  current BMI Ideal Body weight  Body mass index is 20.92 kg/m. Ideal body weight: 75.3 kg (166 lb 0.1 oz)   BMI Readings from Last 4 Encounters:  08/22/21 20.92 kg/m  08/07/21 20.94 kg/m  12/10/20 20.51 kg/m  08/09/20 20.77 kg/m   Wt Readings from Last 4 Encounters:  08/22/21 150 lb (68 kg)  08/07/21 148 lb (67.1 kg)  12/10/20 145 lb (65.8 kg)  08/09/20 151 lb (68.5 kg)    Psych/Mental status: Alert, oriented x 3 (person, place, & time)       Eyes: PERLA Respiratory: No evidence of acute respiratory distress  Assessment  Primary Diagnosis & Pertinent Problem List: There were no encounter diagnoses.  Visit Diagnosis (New problems to examiner): No diagnosis found. Plan of Care (Initial workup plan)  Note: Anthony Craig was reminded that as per protocol, today's visit has been an evaluation only. We have not taken over the patient's controlled substance management.  Problem-specific plan: No problem-specific Assessment & Plan notes found for this encounter.  Lab Orders  No laboratory test(s) ordered today   Imaging Orders  No imaging studies ordered today   Referral Orders  No referral(s) requested today   Procedure Orders    No procedure(s) ordered today   Pharmacotherapy (current): Medications ordered:  No orders of the defined types were placed in this encounter.  Medications administered during this visit: Anthony Boodram. Craig had no medications administered during this visit.   Pharmacological management options:  Opioid Analgesics: The patient was informed that there is no guarantee that he would be a candidate for opioid analgesics. The decision will be made following CDC guidelines. This decision will be based on the results of diagnostic studies, as well as Anthony Craig's risk profile.   Membrane stabilizer: To be determined at a later time  Muscle relaxant: To be determined at a later time  NSAID: To be determined at a later time  Other analgesic(s): To  be determined at a later time   Interventional management options: Mr. Uhlig was informed that there is no guarantee that he would be a candidate for interventional therapies. The decision will be based on the results of diagnostic studies, as well as Mr. Mcmahen's risk profile.  Procedure(s) under consideration:  Pending results of ordered studies      Interventional Therapies  Risk  Complexity Considerations:   Estimated body mass index is 20.92 kg/m as calculated from the following:   Height as of this encounter: '5\' 11"'  (1.803 m).   Weight as of this encounter: 150 lb (68 kg). WNL   Planned  Pending:   Pending further evaluation   Under consideration:   ***   Completed:   None at this time   Therapeutic  Palliative (PRN) options:   None established      Provider-requested follow-up: No follow-ups on file.  Future Appointments  Date Time Provider [place]  10/29/2021  3:00 PM [facility] PHARMACIST [facility]  12/16/2021  1:40 PM Jerrol Banana., MD [facility]    Note by: Gillis Santa, MD Date: 08/22/2021; Time: 9:29 AM

## 2021-08-22 NOTE — Progress Notes (Signed)
Safety precautions to be maintained throughout the outpatient stay will include: orient to surroundings, keep bed in low position, maintain call bell within reach at all times, provide assistance with transfer out of bed and ambulation.  

## 2021-08-22 NOTE — Progress Notes (Signed)
Patient: Anthony Craig [facility]  Service Category: E/M  Provider: Gillis Santa, MD  DOB: 08-Jul-1928  DOS: 08/22/2021  Referring Provider: Jerrol Banana.,*  MRN: [id number]  Setting: Ambulatory outpatient  PCP: Jerrol Banana., MD  Type: New Patient  Specialty: Interventional Pain Management    Location: Office  Delivery: Face-to-face     Primary Reason(s) for Visit: Encounter for initial evaluation of one or more chronic problems (new to examiner) potentially causing chronic pain, and posing a threat to normal musculoskeletal function. (Level of risk: High) CC: Back Pain  HPI  Anthony Craig is a 87 y.o. year old, male patient, who comes for the first time to our practice referred by Jerrol Banana.,* for our initial evaluation of his chronic pain. He has Allergic drug reaction; B12 deficiency; Cervical nerve root disorder; Chest discomfort; Type 2 diabetes mellitus with other circulatory complication, with long-term current use of insulin ([place]); Vertigo; Benign essential tremor; Fatigue; Acid reflux; HLD (hyperlipidemia); BP (high blood pressure); Hernia, inguinal, left; Cannot sleep; Chronic pancreatitis, unspecified pancreatitis type ([place]); CA of prostate ([place]); Chest pain; Viral upper respiratory tract infection; Chronic hepatitis ([place]); Lumbar radiculopathy; Spinal stenosis, lumbar region, with neurogenic claudication; Lumbar degenerative disc disease; and Lumbar spondylosis on their problem list. Today he comes in for evaluation of his Back Pain  Pain Assessment: Location: Lower, Mid Back Radiating: Denies however when "I take a funny step or go down a step i feel pain in front of right lef into the right knee" Onset: More than a month ago Duration: Chronic pain Quality: Constant, Dull Severity: 4 /10 (subjective, self-reported pain score)  Effect on ADL: "Standing makes pain worse, walking any distance, especially up hill it bothers me" Timing: Constant Modifying factors:  Sitting in recliner or laying in bed BP: (!) 136/58  HR: 76  Onset and Duration: Gradual Cause of pain: Unknown Severity: No change since onset Timing: Morning, Noon, Afternoon, During activity or exercise, and After activity or exercise Aggravating Factors: Bending, Motion, Prolonged standing, Walking, Walking uphill, and Walking downhill Alleviating Factors:  Osteo bi-flex Associated Problems: Weakness Quality of Pain: Aching, Constant, and Dull Previous Examinations or Tests: The patient denies   Previous Treatments: The patient denies     Anthony Craig is a very pleasant 87 year old male who presents with a chief complaint of low back pain with occasional radiation into bilateral buttocks and posterior thighs.  This is been going on for many months but got aggravated over the last 6 months.  He has had to give up golf which she really enjoyed.  He takes over-the-counter Tylenol to help with his pain.  He is being referred by Dr. Rosanna Randy, his primary care provider.  Of note Anthony Craig has done lower back exercises in the past that he has learned from physical therapy.  He states that he has trouble walking a long distance and bending forward usually helps.  He does have some weakness with exertion the back of his thighs.  He is interested in a spinal injection.  Meds   Current Outpatient Medications:    Alcohol Swabs (B-D SINGLE USE SWABS REGULAR) PADS, USE AS DIRECTED  WITH  EACH  FINGERSTICK, Disp: 300 each, Rfl: 3   aspirin EC 81 MG tablet, Take 81 mg by mouth daily., Disp: , Rfl:    atorvastatin (LIPITOR) 40 MG tablet, TAKE 1 TABLET BY MOUTH EVERYDAY AT BEDTIME (Patient taking differently: 20 mg.), Disp: 90 tablet, Rfl: 3   b  complex vitamins tablet, Take 1 tablet by mouth daily., Disp: , Rfl:    calcium-vitamin D 250-100 MG-UNIT per tablet, Take 1 tablet by mouth daily., Disp: , Rfl:    cholecalciferol (VITAMIN D) 1000 UNITS tablet, Take 1,000 Units by mouth daily., Disp: , Rfl:     Coenzyme Q10 (COQ10) 200 MG CAPS, Take 100 mg by mouth daily. , Disp: , Rfl:    diphenhydrAMINE (BENADRYL) 25 MG tablet, Take 25 mg by mouth every 6 (six) hours as needed., Disp: , Rfl:    diphenoxylate-atropine (LOMOTIL) 2.5-0.025 MG per tablet, Take 1 tablet by mouth 4 (four) times daily as needed for diarrhea or loose stools., Disp: , Rfl:    famotidine (PEPCID) 20 MG tablet, Take 1 tablet (20 mg total) by mouth 2 (two) times daily., Disp: 60 tablet, Rfl: 0   gabapentin (NEURONTIN) 100 MG capsule, Take 1-2 capsules (100-200 mg total) by mouth at bedtime. Follow written titration schedule., Disp: 90 capsule, Rfl: 0   Glucosamine-Chondroitin (OSTEO BI-FLEX REGULAR STRENGTH PO), Take 1 tablet by mouth in the morning and at bedtime., Disp: , Rfl:    hydrocortisone 2.5 % lotion, Apply 1 application topically 2 (two) times daily. To affected areas of face only as needed for flares of itching/scaling , Disp: , Rfl:    hydroxypropyl methylcellulose / hypromellose (ISOPTO TEARS / GONIOVISC) 2.5 % ophthalmic solution, Place 1 drop into both eyes 4 (four) times daily as needed for dry eyes., Disp: , Rfl:    insulin aspart protamine - aspart (NOVOLOG MIX 70/30 FLEXPEN) (70-30) 100 UNIT/ML FlexPen, 12 units in the am and 6 units after dinner., Disp: , Rfl:    Krill Oil 500 MG CAPS, Take 1 capsule by mouth daily. [place], Disp: , Rfl:    lipase/protease/amylase (CREON) 36000 UNITS CPEP capsule, Take 36,000 Units by mouth., Disp: , Rfl:    Misc Natural Products (OSTEO BI-FLEX ADV JOINT SHIELD PO), Take by mouth 2 (two) times daily., Disp: , Rfl:    Multiple Vitamins-Minerals (MULTIVITAMIN PO), Take 1 tablet by mouth daily., Disp: , Rfl:    Multiple Vitamins-Minerals (PRESERVISION AREDS PO), Take by mouth. , Disp: , Rfl:    omega-3 acid ethyl esters (LOVAZA) 1 g capsule, Take by mouth 2 (two) times daily. , Disp: , Rfl:    pantoprazole (PROTONIX) 40 MG tablet, TAKE 1 TABLET BY MOUTH EVERY DAY, Disp: 90 tablet,  Rfl: 1   primidone (MYSOLINE) 50 MG tablet, Take 1 tablet (50 mg total) by mouth 4 (four) times daily. (Patient taking differently: Take 50 mg by mouth at bedtime.), Disp: 360 tablet, Rfl: 3   sertraline (ZOLOFT) 25 MG tablet, Take 1 tablet (25 mg total) by mouth daily., Disp: 30 tablet, Rfl: 4   ursodiol (ACTIGALL) 300 MG capsule, Take 300 mg by mouth 2 (two) times daily., Disp: , Rfl:    vitamin B-12 (CYANOCOBALAMIN) 1000 MCG tablet, Take 5,000 mcg by mouth daily. , Disp: , Rfl:    HYDROcodone-acetaminophen (NORCO/VICODIN) 5-325 MG tablet, Take 1-2 tablets by mouth every 4 (four) hours as needed for moderate pain. (Patient not taking: Reported on 06/18/2020), Disp: 100 tablet, Rfl: 0  Imaging Review  Lumbar MRI from June 06, 2021  Severe canal narrowing at L4-L5 secondary to ligamentum flavum hypertrophy and disc bulge.  Moderate spinal canal narrowing at L3-4.  Severe left neuroforaminal narrowing at L3-L4.  Moderate bilateral facet degenerative disc disease at L3-4 with flattening of 5 S1.  Endplate hypertrophy at L5-S1.  Complexity  Note: Imaging results reviewed. Results shared with Mr. Ham, using Layman's terms.                         ROS  Cardiovascular: High blood pressure, Heart surgery, and Blood thinners:  Anticoagulant (ASA 37m). Hyperlipidemia.  Pulmonary or Respiratory: No reported pulmonary signs or symptoms such as wheezing and difficulty taking a deep full breath (Asthma), difficulty blowing air out (Emphysema), coughing up mucus (Bronchitis), persistent dry cough, or temporary stoppage of breathing during sleep Neurological: No reported neurological signs or symptoms such as seizures, abnormal skin sensations, urinary and/or fecal incontinence, being born with an abnormal open spine and/or a tethered spinal cord Psychological-Psychiatric: No reported psychological or psychiatric signs or symptoms such as difficulty sleeping, anxiety, depression, delusions or  hallucinations (schizophrenial), mood swings (bipolar disorders) or suicidal ideations or attempts Gastrointestinal: No reported gastrointestinal signs or symptoms such as vomiting or evacuating blood, reflux, heartburn, alternating episodes of diarrhea and constipation, inflamed or scarred liver, or pancreas or irrregular and/or infrequent bowel movements Genitourinary: No reported renal or genitourinary signs or symptoms such as difficulty voiding or producing urine, peeing blood, non-functioning kidney, kidney stones, difficulty emptying the bladder, difficulty controlling the flow of urine, or chronic kidney disease Hematological: No reported hematological signs or symptoms such as prolonged bleeding, low or poor functioning platelets, bruising or bleeding easily, hereditary bleeding problems, low energy levels due to low hemoglobin or being anemic Endocrine: High blood sugar requiring insulin (IDDM) Rheumatologic: No reported rheumatological signs and symptoms such as fatigue, joint pain, tenderness, swelling, redness, heat, stiffness, decreased range of motion, with or without associated rash Musculoskeletal: Negative for myasthenia gravis, muscular dystrophy, multiple sclerosis or malignant hyperthermia Work History: Retired  Allergies  Mr. FJamaris allergic to doxycycline and hydrocodone bit-homatrop mbr.  Laboratory Chemistry Profile   Renal Lab Results  Component Value Date   BUN 18 07/28/2021   CREATININE 0.83 07/28/2021   BCR 14 07/16/2020   GFRAA 80 07/16/2020   GFRNONAA >60 07/28/2021   SPECGRAV 1.020 06/08/2019   PHUR 5.0 06/08/2019   PROTEINUR Negative 06/08/2019     Electrolytes Lab Results  Component Value Date   NA 135 07/28/2021   K 4.7 07/28/2021   CL 98 07/28/2021   CALCIUM 8.6 (L) 07/28/2021   MG 1.5 (L) 01/17/2014     Hepatic Lab Results  Component Value Date   AST 13 07/16/2020   ALT 12 07/16/2020   ALBUMIN 4.5 07/16/2020   ALKPHOS 93 07/16/2020    AMYLASE 50 09/10/2016   LIPASE 7 (L) 11/23/2019     ID No results found for: LYMEIGGIGMAB, HIV, SARSCOV2NAA, STAPHAUREUS, MRSAPCR, HCVAB, PREGTESTUR, RMSFIGG, QFVRPH1IGG, QFVRPH2IGG, LYMEIGGIGMAB   Bone No results found for: VD25OH, VD125OH2TOT, [id number] 25OHVITD1, 25OHVITD2, 25OHVITD3, TESTOFREE, TESTOSTERONE   Endocrine Lab Results  Component Value Date   GLUCOSE 142 (H) 07/28/2021   GLUCOSEU Negative 01/16/2014   HGBA1C 8.2 (A) 12/10/2020   TSH 1.220 08/17/2019   FREET4 1.20 03/15/2015     Neuropathy Lab Results  Component Value Date   HGBA1C 8.2 (A) 12/10/2020     CNS No results found for: COLORCSF, APPEARCSF, RBCCOUNTCSF, WBCCSF, POLYSCSF, LYMPHSCSF, EOSCSF, PROTEINCSF, GLUCCSF, JCVIRUS, CSFOLI, IGGCSF, LABACHR, ACETBL, LABACHR, ACETBL   Inflammation (CRP: Acute  ESR: Chronic) No results found for: CRP, ESRSEDRATE, LATICACIDVEN   Rheumatology No results found for: RF, ANA, LABURIC, URICUR, LYMEIGGIGMAB, LYMEABIGMQN, HLAB27   Coagulation Lab Results  Component Value Date  PLT 174 07/28/2021     Cardiovascular Lab Results  Component Value Date   TROPONINI <0.03 01/02/2019   HGB 14.7 07/28/2021   HCT 43.0 07/28/2021     Screening No results found for: SARSCOV2NAA, COVIDSOURCE, STAPHAUREUS, MRSAPCR, HCVAB, HIV, PREGTESTUR   Cancer Lab Results  Component Value Date   CEA 1.7 01/17/2014     Allergens No results found for: ALMOND, APPLE, ASPARAGUS, AVOCADO, BANANA, BARLEY, BASIL, BAYLE[place], GREENBEAN, LIMABEAN, WHITEBEAN, BEEFIGE, REDBEET, BLUEBERRY, BROCCOLI, CABBAGE, MELON, CARROT, CASEIN, CASHEWNUT, CAULIFLOWER, CELERY     Note: Lab results reviewed.  PFSH  Drug: Mr. Nicasio  reports no history of drug use. Alcohol:  reports current alcohol use. Tobacco:  reports that he quit smoking about 41 years ago. His smoking use included cigarettes. He has a 15.00 pack-year smoking history. He has never used smokeless tobacco. Medical:  has a past  medical history of Cancer ([place]), Coronary artery disease, Diabetes mellitus without complication ([place]), Hyperlipidemia, and Hypertension. Family: family history includes COPD in his brother; Cancer (age of onset: 41) in his mother; Diabetes in an other family member; Heart disease in his father and mother; Stroke in his brother.  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   Chest discomfort 02/08/2015   Type 2 diabetes mellitus with other circulatory complication, with long-term current use of insulin ([place])    Vertigo 02/08/2015   Benign essential tremor 02/08/2015   Fatigue 02/08/2015   Acid reflux 02/08/2015   HLD (hyperlipidemia) 02/08/2015   BP (high blood pressure) 02/08/2015   Hernia, inguinal, left 02/08/2015   Cannot sleep 02/08/2015   Chronic pancreatitis, unspecified pancreatitis type ([place])    CA of prostate ([place]) 02/08/2015   Chest pain 04/27/2015   Viral upper respiratory tract infection 11/28/2020   Chronic hepatitis ([place]) 08/11/2021   Lumbar radiculopathy 08/22/2021   Spinal stenosis, lumbar region, with neurogenic claudication 08/22/2021   Lumbar degenerative disc disease 08/22/2021   Lumbar spondylosis 08/22/2021   Resolved Ambulatory Problems    Diagnosis Date Noted   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 In no apparent acute distress Vitals:   08/22/21 0848  BP: (!) 136/58  Pulse: 76  Resp: 16  Temp: (!) 97.1 F (36.2 C)  TempSrc: Temporal  SpO2: 100%  Weight: 150 lb (68 kg)  Height: '5\' 11"'  (1.803 m)   BMI Assessment: Estimated body mass index is 20.92 kg/m as calculated from the following:   Height as of this encounter: '5\' 11"'  (1.803 m).   Weight as of this encounter: 150 lb (68 kg).  BMI interpretation table: BMI level Category Range association with higher incidence of chronic pain  <18 kg/m2 Underweight  18.5-24.9 kg/m2 Ideal body weight   25-29.9 kg/m2 Overweight Increased incidence by 20%  30-34.9 kg/m2 Obese (Class I) Increased incidence by 68%  35-39.9 kg/m2 Severe obesity (Class II) Increased incidence by 136%  >40 kg/m2 Extreme obesity (Class III) Increased incidence by 254%   Patient's current BMI Ideal Body weight  Body mass index is 20.92 kg/m. Ideal body weight: 75.3 kg (166 lb 0.1 oz)   BMI Readings from Last 4 Encounters:  08/22/21 20.92 kg/m  08/07/21 20.94 kg/m  12/10/20 20.51 kg/m  08/09/20 20.77 kg/m   Wt Readings from Last 4 Encounters:  08/22/21 150 lb (68 kg)  08/07/21 148 lb (67.1 kg)  12/10/20 145 lb (65.8 kg)  08/09/20 151 lb (68.5 kg)    Psych/Mental status: Alert, oriented x 3 (person, place, & time)       Eyes: PERLA Respiratory: No evidence of acute respiratory distress  Lumbar Spine Area Exam  Skin & Axial Inspection: No masses, redness, or swelling Alignment: Symmetrical Functional ROM: Unrestricted ROM       Stability: No instability detected Muscle Tone/Strength: Functionally intact. No obvious neuro-muscular anomalies detected. Sensory (Neurological): Neurogenic pain pattern  Gait & Posture Assessment  Ambulation: Unassisted Gait: Antalgic Posture: Difficulty standing up straight, due to pain  Lower Extremity  Exam    Side: Right lower extremity  Side: Left lower extremity  Stability: No instability observed          Stability: No instability observed          Skin & Extremity Inspection: Skin color, temperature, and hair growth are WNL. No peripheral edema or cyanosis. No masses, redness, swelling, asymmetry, or associated skin lesions. No contractures.  Skin & Extremity Inspection: Skin color, temperature, and hair growth are WNL. No peripheral edema or cyanosis. No masses, redness, swelling, asymmetry, or associated skin lesions. No contractures.  Functional ROM: Pain restricted ROM                  Functional ROM: Pain restricted ROM                  Muscle Tone/Strength: Functionally intact. No obvious neuro-muscular anomalies detected.  Muscle Tone/Strength: Functionally intact. No obvious neuro-muscular anomalies detected.  Sensory (Neurological): Neurogenic pain pattern        Sensory (Neurological): Neurogenic pain pattern        DTR: Patellar: deferred today Achilles: deferred today Plantar: deferred today  DTR: Patellar: deferred today Achilles: deferred today Plantar: deferred today  Palpation: No palpable anomalies  Palpation: No palpable anomalies    Assessment  Primary Diagnosis & Pertinent Problem List: The primary encounter diagnosis was Chronic radicular lumbar pain. Diagnoses of Spinal stenosis, lumbar region, with neurogenic claudication, Lumbar radiculopathy, Lumbar degenerative disc disease, Lumbar spondylosis, and Chronic pain syndrome were also pertinent to this visit.  Visit Diagnosis (New problems to examiner): 1. Chronic radicular lumbar pain   2. Spinal stenosis, lumbar region, with neurogenic claudication   3. Lumbar radiculopathy   4. Lumbar degenerative disc disease   5. Lumbar spondylosis   6. Chronic pain syndrome    Plan of Care (Initial workup plan)    1. Chronic radicular lumbar pain -Discussed lumbar epidural steroid injection - gabapentin  (NEURONTIN) 100 MG capsule; Take 1-2 capsules (100-200 mg total) by mouth at bedtime. Follow written titration schedule.  Dispense: 90 capsule; Refill: 0 - Lumbar Epidural Injection; Future  2. Spinal stenosis, lumbar region, with neurogenic claudication -Lumbar ESI as above - gabapentin (NEURONTIN)  100 MG capsule; Take 1-2 capsules (100-200 mg total) by mouth at bedtime. Follow written titration schedule.  Dispense: 90 capsule; Refill: 0 - Lumbar Epidural Injection; Future -Can also consider mild for hypertrophic ligamentum flavum contributing to lumbar spinal stenosis and neurogenic claudication  3. Lumbar radiculopathy -ESI as above - gabapentin (NEURONTIN) 100 MG capsule; Take 1-2 capsules (100-200 mg total) by mouth at bedtime. Follow written titration schedule.  Dispense: 90 capsule; Refill: 0 - Lumbar Epidural Injection; Future  4. Lumbar degenerative disc disease -Lumbar facet arthropathy, discussed diagnostic lumbar facet medial branch nerve blocks and possible lumbar radiofrequency ablation.  5. Lumbar spondylosis -Lumbar facet arthropathy, discussed diagnostic lumbar facet medial branch nerve blocks and possible lumbar radiofrequency ablation.  6. Chronic pain syndrome - gabapentin (NEURONTIN) 100 MG capsule; Take 1-2 capsules (100-200 mg total) by mouth at bedtime. Follow written titration schedule.  Dispense: 90 capsule; Refill: 0 - Lumbar Epidural Injection; Future    Procedure Orders         Lumbar Epidural Injection     Pharmacotherapy (current): Medications ordered:  Meds ordered this encounter  Medications   gabapentin (NEURONTIN) 100 MG capsule    Sig: Take 1-2 capsules (100-200 mg total) by mouth at bedtime. Follow written titration schedule.    Dispense:  90 capsule    Refill:  0    Fill one day early if pharmacy is closed on scheduled refill date. May substitute for generic if available.   Medications administered during this visit: Mariano Doshi. Kelliher had  no medications administered during this visit.   Pharmacological management options:   Interventional management options: Mr. Elena was informed that there is no guarantee that he would be a candidate for interventional therapies. The decision will be based on the results of diagnostic studies, as well as Mr. Dowell's risk profile.  Procedure(s) under consideration:  Lumbar epidural steroid injection Lumbar facet medial branch nerve blocks Lumbar medial branch radiofrequency ablation Trigger point injections Qutenza for painful diabetic neuropathy   Provider-requested follow-up: Return in about 2 weeks (around 09/05/2021) for L-ESI , without sedation.  I spent a total of 60 minutes reviewing chart data, face-to-face evaluation with the patient, counseling and coordination of care as detailed above.   Future Appointments  Date Time Provider [place]  10/29/2021  3:00 PM [facility] PHARMACIST [facility]  12/16/2021  1:40 PM Jerrol Banana., MD [facility]    Note by: Gillis Santa, MD Date: 08/22/2021; Time: 10:29 AM

## 2021-08-26 LAB — HEPATIC FUNCTION PANEL
ALT: 29 (ref 10–40)
AST: 21 (ref 14–40)
Alkaline Phosphatase: 123 (ref 25–125)
Bilirubin, Total: 0.4

## 2021-08-26 LAB — BASIC METABOLIC PANEL
BUN: 22 — AB (ref 4–21)
CO2: 29 — AB (ref 13–22)
Chloride: 105 (ref 99–108)
Creatinine: 1 (ref 0.6–1.3)
Glucose: 171
Potassium: 4.9 (ref 3.4–5.3)

## 2021-08-26 LAB — VITAMIN D 25 HYDROXY (VIT D DEFICIENCY, FRACTURES): Vit D, 25-Hydroxy: 37.9

## 2021-08-26 LAB — COMPREHENSIVE METABOLIC PANEL
Albumin: 3.8 (ref 3.5–5.0)
Calcium: 9.4 (ref 8.7–10.7)
GFR calc non Af Amer: 71

## 2021-08-26 LAB — CBC AND DIFFERENTIAL
HCT: 46 (ref 41–53)
Hemoglobin: 14.8 (ref 13.5–17.5)
Platelets: 212 (ref 150–399)
WBC: 8.2

## 2021-08-26 LAB — LIPID PANEL
Cholesterol: 123 (ref 0–200)
HDL: 44 (ref 35–70)
LDL Cholesterol: 59
Triglycerides: 137 (ref 40–160)

## 2021-08-26 LAB — HEMOGLOBIN A1C: Hemoglobin A1C: 8

## 2021-09-11 ENCOUNTER — Ambulatory Visit
Admission: RE | Admit: 2021-09-11 | Discharge: 2021-09-11 | Disposition: A | Discharge status: Home / Self Care | Payer: No Typology Code available for payment source | Source: Ambulatory Visit | Attending: Student in an Organized Health Care Education/Training Program | Admitting: Student in an Organized Health Care Education/Training Program

## 2021-09-11 ENCOUNTER — Ambulatory Visit (HOSPITAL_BASED_OUTPATIENT_CLINIC_OR_DEPARTMENT_OTHER)
Payer: No Typology Code available for payment source | Admitting: Student in an Organized Health Care Education/Training Program

## 2021-09-11 ENCOUNTER — Other Ambulatory Visit: Payer: Self-pay

## 2021-09-11 ENCOUNTER — Encounter: Payer: Self-pay | Admitting: Student in an Organized Health Care Education/Training Program

## 2021-09-11 VITALS — BP 165/97 | HR 77 | Temp 97.2°F | Resp 16 | Ht 71.0 in | Wt 150.0 lb

## 2021-09-11 DIAGNOSIS — M48062 Spinal stenosis, lumbar region with neurogenic claudication: Secondary | ICD-10-CM | POA: Diagnosis present

## 2021-09-11 DIAGNOSIS — G8929 Other chronic pain: Secondary | ICD-10-CM | POA: Insufficient documentation

## 2021-09-11 DIAGNOSIS — M5416 Radiculopathy, lumbar region: Secondary | ICD-10-CM | POA: Diagnosis present

## 2021-09-11 DIAGNOSIS — G894 Chronic pain syndrome: Secondary | ICD-10-CM | POA: Diagnosis present

## 2021-09-11 MED ORDER — LIDOCAINE HCL 2 % IJ SOLN
20.0000 mL | Freq: Once | INTRAMUSCULAR | Status: AC
  Administered 2021-09-11: 400 mg

## 2021-09-11 MED ORDER — SODIUM CHLORIDE (PF) 0.9 % IJ SOLN
INTRAMUSCULAR | Status: AC
  Filled 2021-09-11: qty 10

## 2021-09-11 MED ORDER — SODIUM CHLORIDE 0.9% FLUSH
2.0000 mL | Freq: Once | INTRAVENOUS | Status: AC
  Administered 2021-09-11: 2 mL

## 2021-09-11 MED ORDER — IOHEXOL 180 MG/ML  SOLN
10.0000 mL | Freq: Once | INTRAMUSCULAR | Status: AC
  Administered 2021-09-11: 5 mL via EPIDURAL
  Filled 2021-09-11: qty 20

## 2021-09-11 MED ORDER — DEXAMETHASONE SODIUM PHOSPHATE 10 MG/ML IJ SOLN
10.0000 mg | Freq: Once | INTRAMUSCULAR | Status: AC
  Administered 2021-09-11: 10 mg
  Filled 2021-09-11: qty 1

## 2021-09-11 MED ORDER — ROPIVACAINE HCL 2 MG/ML IJ SOLN
INTRAMUSCULAR | Status: AC
  Filled 2021-09-11: qty 20

## 2021-09-11 MED ORDER — ROPIVACAINE HCL 2 MG/ML IJ SOLN
2.0000 mL | Freq: Once | INTRAMUSCULAR | Status: AC
  Administered 2021-09-11: 2 mL via EPIDURAL

## 2021-09-11 NOTE — Progress Notes (Signed)
Safety precautions to be maintained throughout the outpatient stay will include: orient to surroundings, keep bed in low position, maintain call bell within reach at all times, provide assistance with transfer out of bed and ambulation.  

## 2021-09-11 NOTE — Progress Notes (Signed)
PROVIDER NOTE: Information contained herein reflects review and annotations entered in association with encounter. Interpretation of such information and data should be left to medically-trained personnel. Information provided to patient can be located elsewhere in the medical record under "Patient Instructions". Document created using STT-dictation technology, any transcriptional errors that may result from process are unintentional.    Patient: Anthony Craig  Service Category: Procedure Provider: Gillis Santa, MD DOB: 02/15/1928 DOS: 09/11/2021 Location: [facility] MRN: [id number] Setting: Ambulatory - outpatient Referring Provider: Jerrol Banana.,* Type: Established Patient Specialty: Interventional Pain Management PCP: Jerrol Banana., MD  Primary Reason for Visit: Interventional Pain Management Treatment. CC: Back Pain (lower)   Procedure:          Anesthesia, Analgesia, Anxiolysis:  Type: Therapeutic Inter-Laminar Epidural Steroid Injection  #1  Region: Lumbar Level: L4-5 Level. Laterality: Right-Sided         Anesthesia: Local (1-2% Lidocaine)  Anxiolysis: None  Sedation: None  Guidance: Fluoroscopy           Position: Prone with head of the table was raised to facilitate breathing.   Indications: 1. Chronic radicular lumbar pain   2. Spinal stenosis, lumbar region, with neurogenic claudication   3. Lumbar radiculopathy   4. Chronic pain syndrome    Pain Score: Pre-procedure: 3 /10 Post-procedure: 0-No pain/10    Pre-op H&P Assessment:  [name] is a 87 y.o. (year old), male patient, seen today for interventional treatment. He  has a past surgical history that includes Hernia repair (Bilateral); Whipple procedure (2007); Coronary artery bypass graft; Appendectomy; and Biliary drainage. [name] has a current medication list which includes the following prescription(s): b-d single use swabs regular, aspirin ec, atorvastatin, b  complex vitamins, calcium-vitamin d, cholecalciferol, coq10, diphenhydramine, diphenoxylate-atropine, famotidine, gabapentin, glucosamine-chondroitin, hydrocortisone, hydroxypropyl methylcellulose / hypromellose, novolog mix 70/30 flexpen, krill oil, lipase/protease/amylase, misc natural products, multiple vitamin, multiple vitamins-minerals, omega-3 acid ethyl esters, pantoprazole, primidone, sertraline, ursodiol, vitamin b-12, and hydrocodone-acetaminophen. His primarily concern today is the Back Pain (lower)  Initial Vital Signs:  Pulse/HCG Rate: (!) 109  Temp: (!) 97.2 F (36.2 C) Resp: 16 BP: 105/80 SpO2: 98 %  BMI: Estimated body mass index is 20.92 kg/m as calculated from the following:   Height as of this encounter: 5\' 11"  (1.803 m).   Weight as of this encounter: 150 lb (68 kg).  Risk Assessment: Allergies: Reviewed. He is allergic to doxycycline and hydrocodone bit-homatrop mbr.  Allergy Precautions: None required Coagulopathies: Reviewed. None identified.  Blood-thinner therapy: None at this time Active Infection(s): Reviewed. None identified. [name] is afebrile  Site Confirmation: [name] was asked to confirm the procedure and laterality before marking the site Procedure checklist: Completed Consent: Before the procedure and under the influence of no sedative(s), amnesic(s), or anxiolytics, the patient was informed of the treatment options, risks and possible complications. To fulfill our ethical and legal obligations, as recommended by the American Medical Association's Code of Ethics, I have informed the patient of my clinical impression; the nature and purpose of the treatment or procedure; the risks, benefits, and possible complications of the intervention; the alternatives, including doing nothing; the risk(s) and benefit(s) of the alternative treatment(s) or procedure(s); and the risk(s) and benefit(s) of doing nothing. The patient was provided information about  the general risks and possible complications associated with the procedure. These may include, but are not limited to: failure to achieve desired goals, infection, bleeding, organ or nerve damage, allergic reactions,  paralysis, and death. In addition, the patient was informed of those risks and complications associated to Spine-related procedures, such as failure to decrease pain; infection (i.e.: Meningitis, epidural or intraspinal abscess); bleeding (i.e.: epidural hematoma, subarachnoid hemorrhage, or any other type of intraspinal or peri-dural bleeding); organ or nerve damage (i.e.: Any type of peripheral nerve, nerve root, or spinal cord injury) with subsequent damage to sensory, motor, and/or autonomic systems, resulting in permanent pain, numbness, and/or weakness of one or several areas of the body; allergic reactions; (i.e.: anaphylactic reaction); and/or death. Furthermore, the patient was informed of those risks and complications associated with the medications. These include, but are not limited to: allergic reactions (i.e.: anaphylactic or anaphylactoid reaction(s)); adrenal axis suppression; blood sugar elevation that in diabetics may result in ketoacidosis or comma; water retention that in patients with history of congestive heart failure may result in shortness of breath, pulmonary edema, and decompensation with resultant heart failure; weight gain; swelling or edema; medication-induced neural toxicity; particulate matter embolism and blood vessel occlusion with resultant organ, and/or nervous system infarction; and/or aseptic necrosis of one or more joints. Finally, the patient was informed that Medicine is not an exact science; therefore, there is also the possibility of unforeseen or unpredictable risks and/or possible complications that may result in a catastrophic outcome. The patient indicated having understood very clearly. We have given the patient no guarantees and we have made no  promises. Enough time was given to the patient to ask questions, all of which were answered to the patient's satisfaction. [name] has indicated that he wanted to continue with the procedure. Attestation: I, the ordering provider, attest that I have discussed with the patient the benefits, risks, side-effects, alternatives, likelihood of achieving goals, and potential problems during recovery for the procedure that I have provided informed consent. Date  Time: 09/11/2021  8:43 AM  Pre-Procedure Preparation:  Monitoring: As per clinic protocol. Respiration, ETCO2, SpO2, BP, heart rate and rhythm monitor placed and checked for adequate function Safety Precautions: Patient was assessed for positional comfort and pressure points before starting the procedure. Time-out: I initiated and conducted the "Time-out" before starting the procedure, as per protocol. The patient was asked to participate by confirming the accuracy of the "Time Out" information. Verification of the correct person, site, and procedure were performed and confirmed by me, the nursing staff, and the patient. "Time-out" conducted as per Joint Commission's Universal Protocol (UP.01.01.01). Time: 0935  Description of Procedure:          Target Area: The interlaminar space, initially targeting the lower laminar border of the superior vertebral body. Approach: Paramedial approach. Area Prepped: Entire Posterior Lumbar Region DuraPrep (Iodine Povacrylex [0.7% available iodine] and Isopropyl Alcohol, 74% w/w) Safety Precautions: Aspiration looking for blood return was conducted prior to all injections. At no point did we inject any substances, as a needle was being advanced. No attempts were made at seeking any paresthesias. Safe injection practices and needle disposal techniques used. Medications properly checked for expiration dates. SDV (single dose vial) medications used. Description of the Procedure: Protocol guidelines were followed.  The procedure needle was introduced through the skin, ipsilateral to the reported pain, and advanced to the target area. Bone was contacted and the needle walked caudad, until the lamina was cleared. The epidural space was identified using "loss-of-resistance technique" with 2-3 ml of PF-NaCl (0.9% NSS), in a 5cc LOR glass syringe.  Vitals:   09/11/21 0847 09/11/21 0930 09/11/21 0940  BP: 105/80 (!) 157/93 Marland Kitchen)  165/97  Pulse: (!) 109 71 77  Resp: 16 15 16   Temp: (!) 97.2 F (36.2 C)    TempSrc: Temporal    SpO2: 98% 99% 97%  Weight: 150 lb (68 kg)    Height: 5\' 11"  (1.803 m)      Start Time: 0935 hrs. End Time: 0940 hrs.  Materials:  Needle(s) Type: Epidural needle Gauge: 22G Length: 3.5-in Medication(s): Please see orders for medications and dosing details. 6 cc solution made of 3 cc of preservative-free saline, 2 cc of 0.2% ropivacaine, 1 cc of Decadron 10 mg/cc.  Imaging Guidance (Spinal):          Type of Imaging Technique: Fluoroscopy Guidance (Spinal) Indication(s): Assistance in needle guidance and placement for procedures requiring needle placement in or near specific anatomical locations not easily accessible without such assistance. Exposure Time: Please see nurses notes. Contrast: Before injecting any contrast, we confirmed that the patient did not have an allergy to iodine, shellfish, or radiological contrast. Once satisfactory needle placement was completed at the desired level, radiological contrast was injected. Contrast injected under live fluoroscopy. No contrast complications. See chart for type and volume of contrast used. Fluoroscopic Guidance: I was personally present during the use of fluoroscopy. "Tunnel Vision Technique" used to obtain the best possible view of the target area. Parallax error corrected before commencing the procedure. "Direction-depth-direction" technique used to introduce the needle under continuous pulsed fluoroscopy. Once target was reached,  antero-posterior, oblique, and lateral fluoroscopic projection used confirm needle placement in all planes. Images permanently stored in EMR. Interpretation: I personally interpreted the imaging intraoperatively. Adequate needle placement confirmed in multiple planes. Appropriate spread of contrast into desired area was observed. No evidence of afferent or efferent intravascular uptake. No intrathecal or subarachnoid spread observed. Permanent images saved into the patient's record.   Post-operative Assessment:  Post-procedure Vital Signs:  Pulse/HCG Rate: 77 (nsr)  Temp: (!) 97.2 F (36.2 C) Resp: 16 BP: (!) 165/97 SpO2: 97 %  EBL: None  Complications: No immediate post-treatment complications observed by team, or reported by patient.  Minor right L5 dorsiflexion weakness, improving and post procedure eval area.  No headaches or vision changes.  Note: The patient tolerated the entire procedure well. A repeat set of vitals were taken after the procedure and the patient was kept under observation following institutional policy, for this type of procedure. Post-procedural neurological assessment was performed, showing return to baseline, prior to discharge. The patient was provided with post-procedure discharge instructions, including a section on how to identify potential problems. Should any problems arise concerning this procedure, the patient was given instructions to immediately contact us, at any time, without hesitation. In any case, we plan to contact the patient by telephone for a follow-up status report regarding this interventional procedure.    Comments:  No additional relevant information.  Plan of Care  Orders:  Orders Placed This Encounter  Procedures   DG PA[facility] C-ARM 1-60 MIN NO REPORT    Intraoperative interpretation by procedural physician at [facility].    Standing Status:   Standing    Number of Occurrences:   1    Order Specific Question:   Reason  for exam:    Answer:   Assistance in needle guidance and placement for procedures requiring needle placement in or near specific anatomical locations not easily accessible without such assistance.     Medications ordered for procedure: Meds ordered this encounter  Medications   iohexol (OMNIPAQUE) 180 MG/ML injection  10 mL    Must be Myelogram-compatible. If not available, you may substitute with a water-soluble, non-ionic, hypoallergenic, myelogram-compatible radiological contrast medium.   lidocaine (XYLOCAINE) 2 % (with pres) injection 400 mg   ropivacaine (PF) 2 mg/mL (0.2%) (NAROPIN) injection 2 mL   sodium chloride flush (NS) 0.9 % injection 2 mL   dexamethasone (DECADRON) injection 10 mg    Medications administered: We administered iohexol, lidocaine, ropivacaine (PF) 2 mg/mL (0.2%), sodium chloride flush, and dexamethasone.  See the medical record for exact dosing, route, and time of administration.  Follow-up plan:   Return in about 4 weeks (around 10/09/2021).     L4/5 ESI 09/11/21,   Recent Visits Date Type Provider Dept  08/22/21 Office Visit Gillis Santa, MD [facility]  Showing recent visits within past 90 days and meeting all other requirements Today's Visits Date Type Provider Dept  09/11/21 Procedure visit Gillis Santa, MD [facility]  Showing today's visits and meeting all other requirements Future Appointments No visits were found meeting these conditions. Showing future appointments within next 90 days and meeting all other requirements Disposition: Discharge home  Discharge (Date  Time): 09/11/2021;   hrs.   Primary Care Physician: Jerrol Banana., MD Location: [facility] Note by: Gillis Santa, MD Date: 09/11/2021; Time: 9:51 AM  Disclaimer:  Medicine is not an exact science. The only guarantee in medicine is that nothing is guaranteed. It is important to note that the decision to proceed with this  intervention was based on the information collected from the patient. The Data and conclusions were drawn from the patient's questionnaire, the interview, and the physical examination. Because the information was provided in large part by the patient, it cannot be guaranteed that it has not been purposely or unconsciously manipulated. Every effort has been made to obtain as much relevant data as possible for this evaluation. It is important to note that the conclusions that lead to this procedure are derived in large part from the available data. Always take into account that the treatment will also be dependent on availability of resources and existing treatment guidelines, considered by other Pain Management Practitioners as being common knowledge and practice, at the time of the intervention. For Medico-Legal purposes, it is also important to point out that variation in procedural techniques and pharmacological choices are the acceptable norm. The indications, contraindications, technique, and results of the above procedure should only be interpreted and judged by a Board-Certified Interventional Pain Specialist with extensive familiarity and expertise in the same exact procedure and technique.

## 2021-09-11 NOTE — Progress Notes (Signed)
0944 When getting patient out of bed Patient states legs bilateral are numb. Right is worse. Md notified and to room to assess. Will monitor patient .

## 2021-09-12 ENCOUNTER — Telehealth: Payer: Self-pay

## 2021-09-12 NOTE — Telephone Encounter (Signed)
Follow up call.  Patient states he is not sure whether he feels the urge to urinate or not.  States he does not feel like he is wet.  I instructed him to try to pay attention to feeling of urge to urinate and to let me know if he has any incontinence.  I will call and check up with him tomorrow.  Dr Holley Raring notified.

## 2021-09-12 NOTE — Telephone Encounter (Signed)
Post procedure phone call.  Patient states he is doing well.  States his legs are feeling normal but has had no feelings of having to urinate and has had some incontinence.

## 2021-09-13 ENCOUNTER — Telehealth: Payer: Self-pay

## 2021-09-13 NOTE — Telephone Encounter (Signed)
Spoke with patient and he states he is doing much better.  He states he feels like he has control of his bladder at this point.  States his legs feel good and strong.  Informed patient to call us with any questions or concerns.

## 2021-10-10 ENCOUNTER — Other Ambulatory Visit: Payer: Self-pay

## 2021-10-10 ENCOUNTER — Encounter: Payer: Self-pay | Admitting: Student in an Organized Health Care Education/Training Program

## 2021-10-10 ENCOUNTER — Ambulatory Visit
Payer: No Typology Code available for payment source | Attending: Student in an Organized Health Care Education/Training Program | Admitting: Student in an Organized Health Care Education/Training Program

## 2021-10-10 VITALS — BP 143/63 | HR 71 | Temp 97.1°F | Resp 16 | Ht 71.0 in | Wt 150.0 lb

## 2021-10-10 DIAGNOSIS — M48062 Spinal stenosis, lumbar region with neurogenic claudication: Secondary | ICD-10-CM | POA: Insufficient documentation

## 2021-10-10 DIAGNOSIS — M5136 Other intervertebral disc degeneration, lumbar region: Secondary | ICD-10-CM | POA: Diagnosis not present

## 2021-10-10 DIAGNOSIS — M5416 Radiculopathy, lumbar region: Secondary | ICD-10-CM | POA: Diagnosis not present

## 2021-10-10 DIAGNOSIS — G894 Chronic pain syndrome: Secondary | ICD-10-CM | POA: Insufficient documentation

## 2021-10-10 DIAGNOSIS — M47816 Spondylosis without myelopathy or radiculopathy, lumbar region: Secondary | ICD-10-CM | POA: Diagnosis not present

## 2021-10-10 NOTE — Progress Notes (Signed)
PROVIDER NOTE: Information contained herein reflects review and annotations entered in association with encounter. Interpretation of such information and data should be left to medically-trained personnel. Information provided to patient can be located elsewhere in the medical record under "Patient Instructions". Document created using STT-dictation technology, any transcriptional errors that may result from process are unintentional.    Patient: Anthony Craig  Service Category: E/M  Provider: Gillis Santa, MD  DOB: January 15, 1928  DOS: 10/10/2021  Specialty: Interventional Pain Management  MRN: [id number]  Setting: Ambulatory outpatient  PCP: Jerrol Banana., MD  Type: Established Patient    Referring Provider: Jerrol Banana.,*  Location: Office  Delivery: Face-to-face     HPI  [name], a 88 y.o. year old male, is here today because of his Lumbar degenerative disc disease [M51.36]. Mr. Sage primary complain today is Back Pain (Lower/) Last encounter: My last encounter with him was on 09/11/2021. Pertinent problems: Mr. Brannan has Lumbar radiculopathy; Spinal stenosis, lumbar region, with neurogenic claudication; Lumbar degenerative disc disease; and Lumbar spondylosis on their pertinent problem list. Pain Assessment: Severity of Chronic pain is reported as a 3 /10. Location: Back Right, Left, Lower/Denies. Onset: More than a month ago. Quality: Throbbing, Constant, Aching, Nagging, Shooting. Timing: Constant. Modifying factor(s): meds and sitting down. Vitals:  height is '5\' 11"'  (1.803 m) and weight is 150 lb (68 kg). His temperature is 97.1 F (36.2 C) (abnormal). His blood pressure is 143/63 (abnormal) and his pulse is 71. His respiration is 16 and oxygen saturation is 100%.   Reason for encounter: post-procedure evaluation and assessment.    Post-procedure evaluation     Procedure:          Anesthesia, Analgesia, Anxiolysis:  Type: Therapeutic  Inter-Laminar Epidural Steroid Injection  #1  Region: Lumbar Level: L4-5 Level. Laterality: Right-Sided         Anesthesia: Local (1-2% Lidocaine)  Anxiolysis: None  Sedation: None  Guidance: Fluoroscopy           Position: Prone with head of the table was raised to facilitate breathing.   Indications: 1. Chronic radicular lumbar pain   2. Spinal stenosis, lumbar region, with neurogenic claudication   3. Lumbar radiculopathy   4. Chronic pain syndrome    Pain Score: Pre-procedure: 3 /10 Post-procedure: 0-No pain/10     Effectiveness:  Initial hour after procedure: 100 %  Subsequent 4-6 hours post-procedure: 100 %  Analgesia past initial 6 hours: 0 % (only lasted for a day)  Ongoing improvement:  Analgesic:  0%      ROS  Constitutional: Denies any fever or chills Gastrointestinal: No reported hemesis, hematochezia, vomiting, or acute GI distress Musculoskeletal:  + LBP Neurological: No reported episodes of acute onset apraxia, aphasia, dysarthria, agnosia, amnesia, paralysis, loss of coordination, or loss of consciousness  Medication Review  B-D SINGLE USE SWABS REGULAR, CoQ10, Glucosamine-Chondroitin, HYDROcodone-acetaminophen, Krill Oil, Misc Natural Products, Multiple Vitamin, Multiple Vitamins-Minerals, aspirin EC, atorvastatin, b complex vitamins, calcium-vitamin D, cholecalciferol, diphenhydrAMINE, diphenoxylate-atropine, famotidine, gabapentin, hydrocortisone, hydroxypropyl methylcellulose / hypromellose, insulin aspart protamine - aspart, lipase/protease/amylase, omega-3 acid ethyl esters, pantoprazole, primidone, sertraline, ursodiol, and vitamin B-12  History Review  Allergy: Mr. Edgell is allergic to doxycycline and hydrocodone bit-homatrop mbr. Drug: Mr. Robey  reports no history of drug use. Alcohol:  reports current alcohol use. Tobacco:  reports that he quit smoking about 42 years ago. His smoking use included cigarettes. He has a 15.00 pack-year  smoking history. He has never  used smokeless tobacco. Social: Mr. Leabo  reports that he quit smoking about 42 years ago. His smoking use included cigarettes. He has a 15.00 pack-year smoking history. He has never used smokeless tobacco. He reports current alcohol use. He reports that he does not use drugs. Medical:  has a past medical history of Cancer [facility]), Coronary artery disease, Diabetes mellitus without complication ([place]), Hyperlipidemia, and Hypertension. Surgical: Mr. Lipsey  has a past surgical history that includes Hernia repair (Bilateral); Whipple procedure (2007); Coronary artery bypass graft; Appendectomy; and Biliary drainage. Family: family history includes COPD in his brother; Cancer (age of onset: 14) in his mother; Diabetes in an other family member; Heart disease in his father and mother; Stroke in his brother.  Laboratory Chemistry Profile   Renal Lab Results  Component Value Date   BUN 22 (A) 08/26/2021   CREATININE 1.0 08/26/2021   BCR 14 07/16/2020   GFRAA 80 07/16/2020   GFRNONAA 71 08/26/2021    Hepatic Lab Results  Component Value Date   AST 21 08/26/2021   ALT 29 08/26/2021   ALBUMIN 3.8 08/26/2021   ALKPHOS 123 08/26/2021   AMYLASE 50 09/10/2016   LIPASE 7 (L) 11/23/2019    Electrolytes Lab Results  Component Value Date   NA 135 07/28/2021   K 4.9 08/26/2021   CL 105 08/26/2021   CALCIUM 9.4 08/26/2021   MG 1.5 (L) 01/17/2014    Bone Lab Results  Component Value Date   VD25OH 37.9 08/26/2021    Inflammation (CRP: Acute Phase) (ESR: Chronic Phase) No results found for: CRP, ESRSEDRATE, LATICACIDVEN       Note: Above Lab results reviewed.   Physical Exam  General appearance: Well nourished, well developed, and well hydrated. In no apparent acute distress Mental status: Alert, oriented x 3 (person, place, & time)       Respiratory: No evidence of acute respiratory distress Eyes: PERLA Vitals: BP (!) 143/63    Pulse 71    Temp (!) 97.1  F (36.2 C)    Resp 16    Ht '5\' 11"'  (1.803 m)    Wt 150 lb (68 kg)    SpO2 100%    BMI 20.92 kg/m  BMI: Estimated body mass index is 20.92 kg/m as calculated from the following:   Height as of this encounter: '5\' 11"'  (1.803 m).   Weight as of this encounter: 150 lb (68 kg). Ideal: Ideal body weight: 75.3 kg (166 lb 0.1 oz)  Lumbar Spine Area Exam  Skin & Axial Inspection: No masses, redness, or swelling Alignment: Symmetrical Functional ROM: Pain restricted ROM       Stability: No instability detected Muscle Tone/Strength: Functionally intact. No obvious neuro-muscular anomalies detected. Sensory (Neurological): Musculoskeletal pain pattern Palpation: No palpable anomalies       Provocative Tests: Hyperextension/rotation test: (+) bilaterally for facet joint pain.  Gait & Posture Assessment  Ambulation: Unassisted Gait: Relatively normal for age and body habitus Posture: WNL  Lower Extremity Exam    Side: Right lower extremity  Side: Left lower extremity  Stability: No instability observed          Stability: No instability observed          Skin & Extremity Inspection: Skin color, temperature, and hair growth are WNL. No peripheral edema or cyanosis. No masses, redness, swelling, asymmetry, or associated skin lesions. No contractures.  Skin & Extremity Inspection: Skin color, temperature, and hair growth are WNL. No peripheral edema or cyanosis. No  masses, redness, swelling, asymmetry, or associated skin lesions. No contractures.  Functional ROM: Unrestricted ROM                  Functional ROM: Unrestricted ROM                  Muscle Tone/Strength: Functionally intact. No obvious neuro-muscular anomalies detected.  Muscle Tone/Strength: Functionally intact. No obvious neuro-muscular anomalies detected.  Sensory (Neurological): Neurogenic pain pattern        Sensory (Neurological): Neurogenic pain pattern        DTR: Patellar: deferred today Achilles: deferred today Plantar:  deferred today  DTR: Patellar: deferred today Achilles: deferred today Plantar: deferred today  Palpation: No palpable anomalies  Palpation: No palpable anomalies    Assessment   Status Diagnosis  Controlled Having a Flare-up Controlled 1. Lumbar degenerative disc disease   2. Lumbar spondylosis   3. Spinal stenosis, lumbar region, with neurogenic claudication   4. Lumbar radiculopathy   5. Chronic pain syndrome      Updated Problems: Problem  Lumbar Radiculopathy  Spinal Stenosis, Lumbar Region, With Neurogenic Claudication  Lumbar Degenerative Disc Disease  Lumbar Spondylosis    Plan of Care  Parish Dubose has a history of greater than 3 months of moderate to severe pain which is resulted in functional impairment.  The patient has tried various conservative therapeutic options such as NSAIDs, Tylenol, muscle relaxants, physical therapy which was inadequately effective.  Patient's pain is predominantly axial with physical exam findings suggestive of facet arthropathy, lumbar spinal stenosis and lumbar spondylosis. Lumbar facet medial branch nerve blocks were discussed with the patient.  Risks and benefits were reviewed.  Patient would like to proceed with bilateral L3, L4, L5 medial branch nerve block.     Orders:  Orders Placed This Encounter  Procedures   LUMBAR FACET(MEDIAL BRANCH NERVE BLOCK) MBNB    Standing Status:   Future    Standing Expiration Date:   11/10/2021    Scheduling Instructions:     Procedure: Lumbar facet block (AKA.: Lumbosacral medial branch nerve block)     Side: Bilateral     Level: L3-4, L4-5, Facets (L3, L4, L5, Medial Branch Nerves)     Sedation: without     Timeframe: ASAA    Order Specific Question:   Where will this procedure be performed?    Answer:   [facility]   Follow-up plan:   Return in about 4 days (around 10/14/2021) for B/L L3, L4, L5 Fct Block #1 , without sedation.     L4/5 ESI 09/11/21,    Recent Visits Date  Type Provider Dept  09/11/21 Procedure visit Gillis Santa, MD [facility]  08/22/21 Office Visit Gillis Santa, MD [facility]  Showing recent visits within past 90 days and meeting all other requirements Today's Visits Date Type Provider Dept  10/10/21 Office Visit Gillis Santa, MD [facility]  Showing today's visits and meeting all other requirements Future Appointments Date Type Provider Dept  10/14/21 Appointment Gillis Santa, MD [facility]  Showing future appointments within next 90 days and meeting all other requirements  I discussed the assessment and treatment plan with the patient. The patient was provided an opportunity to ask questions and all were answered. The patient agreed with the plan and demonstrated an understanding of the instructions.  Patient advised to call back or seek an in-person evaluation if the symptoms or condition worsens.  Duration of encounter: 74mnutes.  Note by: Gillis Santa, MD Date: 10/10/2021; Time: 1:00 PM

## 2021-10-10 NOTE — Progress Notes (Signed)
Safety precautions to be maintained throughout the outpatient stay will include: orient to surroundings, keep bed in low position, maintain call bell within reach at all times, provide assistance with transfer out of bed and ambulation.  

## 2021-10-14 ENCOUNTER — Encounter: Payer: Self-pay | Admitting: Student in an Organized Health Care Education/Training Program

## 2021-10-14 ENCOUNTER — Ambulatory Visit (HOSPITAL_BASED_OUTPATIENT_CLINIC_OR_DEPARTMENT_OTHER)
Payer: No Typology Code available for payment source | Admitting: Student in an Organized Health Care Education/Training Program

## 2021-10-14 ENCOUNTER — Ambulatory Visit
Admission: RE | Admit: 2021-10-14 | Discharge: 2021-10-14 | Disposition: A | Discharge status: Home / Self Care | Payer: No Typology Code available for payment source | Source: Ambulatory Visit | Attending: Student in an Organized Health Care Education/Training Program | Admitting: Student in an Organized Health Care Education/Training Program

## 2021-10-14 ENCOUNTER — Other Ambulatory Visit: Payer: Self-pay

## 2021-10-14 VITALS — BP 150/91 | HR 74 | Temp 97.7°F | Resp 16 | Ht 71.0 in | Wt 150.0 lb

## 2021-10-14 DIAGNOSIS — M5136 Other intervertebral disc degeneration, lumbar region: Secondary | ICD-10-CM | POA: Diagnosis not present

## 2021-10-14 DIAGNOSIS — M47816 Spondylosis without myelopathy or radiculopathy, lumbar region: Secondary | ICD-10-CM | POA: Diagnosis not present

## 2021-10-14 DIAGNOSIS — M48062 Spinal stenosis, lumbar region with neurogenic claudication: Secondary | ICD-10-CM

## 2021-10-14 MED ORDER — LIDOCAINE HCL 2 % IJ SOLN
20.0000 mL | Freq: Once | INTRAMUSCULAR | Status: AC
  Administered 2021-10-14: 400 mg

## 2021-10-14 MED ORDER — LIDOCAINE HCL 2 % IJ SOLN
INTRAMUSCULAR | Status: AC
  Filled 2021-10-14: qty 20

## 2021-10-14 MED ORDER — ROPIVACAINE HCL 2 MG/ML IJ SOLN
INTRAMUSCULAR | Status: AC
  Filled 2021-10-14: qty 20

## 2021-10-14 MED ORDER — ROPIVACAINE HCL 2 MG/ML IJ SOLN
9.0000 mL | Freq: Once | INTRAMUSCULAR | Status: AC
  Administered 2021-10-14: 9 mL via PERINEURAL

## 2021-10-14 MED ORDER — DEXAMETHASONE SODIUM PHOSPHATE 10 MG/ML IJ SOLN
INTRAMUSCULAR | Status: AC
  Filled 2021-10-14: qty 1

## 2021-10-14 MED ORDER — DEXAMETHASONE SODIUM PHOSPHATE 10 MG/ML IJ SOLN
10.0000 mg | Freq: Once | INTRAMUSCULAR | Status: AC
  Administered 2021-10-14: 10 mg

## 2021-10-14 NOTE — Progress Notes (Signed)
PROVIDER NOTE: Interpretation of information contained herein should be left to medically-trained personnel. Specific patient instructions are provided elsewhere under "Patient Instructions" section of medical record. This document was created in part using STT-dictation technology, any transcriptional errors that may result from this process are unintentional.  Patient: Anthony Craig Type: Established DOB: 29-Nov-1927 MRN: [id number] PCP: Jerrol Banana., MD  Service: Procedure DOS: 10/14/2021 Setting: Ambulatory Location: Ambulatory outpatient facility Delivery: Face-to-face Provider: Gillis Santa, MD Specialty: Interventional Pain Management Specialty designation: 09 Location: Outpatient facility Ref. Prov.: Jerrol Banana.,*    Primary Reason for Visit: Interventional Pain Management Treatment. CC: Back Pain (low)  Procedure:           Type: Lumbar Facet, Medial Branch Block(s) #1  Laterality: Bilateral Level: L3, L4, L5, Medial Branch Level(s). Injecting these levels blocks the L3-4, L4-5, lumbar facet joints. Imaging: Fluoroscopic guidance Anesthesia: Local anesthesia (1-2% Lidocaine) Anxiolysis: None         Sedation: None.  DOS: 10/14/2021 Performed by: Gillis Santa, MD  Primary Purpose: Diagnostic/Therapeutic Indications: Low back pain severe enough to impact quality of life or function. 1. Lumbar degenerative disc disease   2. Lumbar spondylosis   3. Spinal stenosis, lumbar region, with neurogenic claudication    NAS-11 Pain score:   Pre-procedure: 2 /10   Post-procedure: 0-No pain/10     Position / Prep / Materials:  Position: Prone  Prep solution: DuraPrep (Iodine Povacrylex [0.7% available iodine] and Isopropyl Alcohol, 74% w/w) Area Prepped: Posterolateral Lumbosacral Spine (Wide prep: From the lower border of the scapula down to the end of the tailbone and from flank to flank.)  Materials:  Tray: Block Needle(s):  Type: Spinal  Gauge  (G): 22  Length: 3.5-in Qty: 3  Pre-op H&P Assessment:  Anthony Craig is a 88 y.o. (year old), male patient, seen today for interventional treatment. He  has a past surgical history that includes Hernia repair (Bilateral); Whipple procedure (2007); Coronary artery bypass graft; Appendectomy; and Biliary drainage. Anthony Craig has a current medication list which includes the following prescription(s): b-d single use swabs regular, aspirin ec, atorvastatin, b complex vitamins, calcium-vitamin d, cholecalciferol, coq10, diphenhydramine, diphenoxylate-atropine, famotidine, gabapentin, glucosamine-chondroitin, hydrocortisone, hydroxypropyl methylcellulose / hypromellose, novolog mix 70/30 flexpen, krill oil, lipase/protease/amylase, misc natural products, multiple vitamin, multiple vitamins-minerals, omega-3 acid ethyl esters, pantoprazole, primidone, sertraline, ursodiol, vitamin b-12, and hydrocodone-acetaminophen. His primarily concern today is the Back Pain (low)  Initial Vital Signs:  Pulse/HCG Rate: 69  Temp: 97.7 F (36.5 C) Resp: 16 BP: 127/64 SpO2: 96 %  BMI: Estimated body mass index is 20.92 kg/m as calculated from the following:   Height as of this encounter: 5\' 11"  (1.803 m).   Weight as of this encounter: 150 lb (68 kg).  Risk Assessment: Allergies: Reviewed. He is allergic to doxycycline and hydrocodone bit-homatrop mbr.  Allergy Precautions: None required Coagulopathies: Reviewed. None identified.  Blood-thinner therapy: None at this time Active Infection(s): Reviewed. None identified. Anthony Craig is afebrile  Site Confirmation: Anthony Craig was asked to confirm the procedure and laterality before marking the site Procedure checklist: Completed Consent: Before the procedure and under the influence of no sedative(s), amnesic(s), or anxiolytics, the patient was informed of the treatment options, risks and possible complications. To fulfill our ethical and legal obligations, as  recommended by the American Medical Association's Code of Ethics, I have informed the patient of my clinical impression; the nature and purpose of the treatment or procedure; the risks, benefits, and possible  complications of the intervention; the alternatives, including doing nothing; the risk(s) and benefit(s) of the alternative treatment(s) or procedure(s); and the risk(s) and benefit(s) of doing nothing. The patient was provided information about the general risks and possible complications associated with the procedure. These may include, but are not limited to: failure to achieve desired goals, infection, bleeding, organ or nerve damage, allergic reactions, paralysis, and death. In addition, the patient was informed of those risks and complications associated to Spine-related procedures, such as failure to decrease pain; infection (i.e.: Meningitis, epidural or intraspinal abscess); bleeding (i.e.: epidural hematoma, subarachnoid hemorrhage, or any other type of intraspinal or peri-dural bleeding); organ or nerve damage (i.e.: Any type of peripheral nerve, nerve root, or spinal cord injury) with subsequent damage to sensory, motor, and/or autonomic systems, resulting in permanent pain, numbness, and/or weakness of one or several areas of the body; allergic reactions; (i.e.: anaphylactic reaction); and/or death. Furthermore, the patient was informed of those risks and complications associated with the medications. These include, but are not limited to: allergic reactions (i.e.: anaphylactic or anaphylactoid reaction(s)); adrenal axis suppression; blood sugar elevation that in diabetics may result in ketoacidosis or comma; water retention that in patients with history of congestive heart failure may result in shortness of breath, pulmonary edema, and decompensation with resultant heart failure; weight gain; swelling or edema; medication-induced neural toxicity; particulate matter embolism and blood vessel  occlusion with resultant organ, and/or nervous system infarction; and/or aseptic necrosis of one or more joints. Finally, the patient was informed that Medicine is not an exact science; therefore, there is also the possibility of unforeseen or unpredictable risks and/or possible complications that may result in a catastrophic outcome. The patient indicated having understood very clearly. We have given the patient no guarantees and we have made no promises. Enough time was given to the patient to ask questions, all of which were answered to the patient's satisfaction. Mr. Petillo has indicated that he wanted to continue with the procedure. Attestation: I, the ordering provider, attest that I have discussed with the patient the benefits, risks, side-effects, alternatives, likelihood of achieving goals, and potential problems during recovery for the procedure that I have provided informed consent. Date   Time: 10/14/2021  7:46 AM  Pre-Procedure Preparation:  Monitoring: As per clinic protocol. Respiration, ETCO2, SpO2, BP, heart rate and rhythm monitor placed and checked for adequate function Safety Precautions: Patient was assessed for positional comfort and pressure points before starting the procedure. Time-out: I initiated and conducted the "Time-out" before starting the procedure, as per protocol. The patient was asked to participate by confirming the accuracy of the "Time Out" information. Verification of the correct person, site, and procedure were performed and confirmed by me, the nursing staff, and the patient. "Time-out" conducted as per Joint Commission's Universal Protocol (UP.01.01.01). Time: 0821  Description of Procedure:          Laterality: Bilateral. The procedure was performed in identical fashion on both sides. Levels:  L3, L4, L5,  Medial Branch Level(s)  Safety Precautions: Aspiration looking for blood return was conducted prior to all injections. At no point did we inject any  substances, as a needle was being advanced. Before injecting, the patient was told to immediately notify me if he was experiencing any new onset of "ringing in the ears, or metallic taste in the mouth". No attempts were made at seeking any paresthesias. Safe injection practices and needle disposal techniques used. Medications properly checked for expiration dates. SDV (single dose vial)  medications used. After the completion of the procedure, all disposable equipment used was discarded in the proper designated medical waste containers. Local Anesthesia: Protocol guidelines were followed. The patient was positioned over the fluoroscopy table. The area was prepped in the usual manner. The time-out was completed. The target area was identified using fluoroscopy. A 12-in long, straight, sterile hemostat was used with fluoroscopic guidance to locate the targets for each level blocked. Once located, the skin was marked with an approved surgical skin marker. Once all sites were marked, the skin (epidermis, dermis, and hypodermis), as well as deeper tissues (fat, connective tissue and muscle) were infiltrated with a small amount of a short-acting local anesthetic, loaded on a 10cc syringe with a 25G, 1.5-in  Needle. An appropriate amount of time was allowed for local anesthetics to take effect before proceeding to the next step. Local Anesthetic: Lidocaine 2.0% The unused portion of the local anesthetic was discarded in the proper designated containers. Technical explanation of process:   L3 Medial Branch Nerve Block (MBB): The target area for the L3 medial branch is at the junction of the postero-lateral aspect of the superior articular process and the superior, posterior, and medial edge of the transverse process of L4. Under fluoroscopic guidance, a Quincke needle was inserted until contact was made with os over the superior postero-lateral aspect of the pedicular shadow (target area). After negative aspiration for  blood, 62mL of the nerve block solution was injected without difficulty or complication. The needle was removed intact. L4 Medial Branch Nerve Block (MBB): The target area for the L4 medial branch is at the junction of the postero-lateral aspect of the superior articular process and the superior, posterior, and medial edge of the transverse process of L5. Under fluoroscopic guidance, a Quincke needle was inserted until contact was made with os over the superior postero-lateral aspect of the pedicular shadow (target area). After negative aspiration for blood, 38mL of the nerve block solution was injected without difficulty or complication. The needle was removed intact. L5 Medial Branch Nerve Block (MBB): The target area for the L5 medial branch is at the junction of the postero-lateral aspect of the superior articular process and the superior, posterior, and medial edge of the sacral ala. Under fluoroscopic guidance, a Quincke needle was inserted until contact was made with os over the superior postero-lateral aspect of the pedicular shadow (target area). After negative aspiration for blood, 53mL of the nerve block solution was injected without difficulty or complication. The needle was removed intact.  12 cc solution made of 10 cc of 0.2% ropivacaine, 2 cc of Decadron 10 mg/cc.  2 cc injected at each level above bilaterally.   Once the entire procedure was completed, the treated area was cleaned, making sure to leave some of the prepping solution back to take advantage of its long term bactericidal properties.      Illustration of the posterior view of the lumbar spine and the posterior neural structures. Laminae of L2 through S1 are labeled. DPRL5, dorsal primary ramus of L5; DPRS1, dorsal primary ramus of S1; DPR3, dorsal primary ramus of L3; FJ, facet (zygapophyseal) joint L3-L4; I, inferior articular process of L4; LB1, lateral branch of dorsal primary ramus of L1; IAB, inferior articular branches  from L3 medial branch (supplies L4-L5 facet joint); IBP, intermediate branch plexus; MB3, medial branch of dorsal primary ramus of L3; NR3, third lumbar nerve root; S, superior articular process of L5; SAB, superior articular branches from L4 (supplies L4-5 facet joint  also); TP3, transverse process of L3.  Vitals:   10/14/21 0753 10/14/21 0815 10/14/21 0825 10/14/21 0832  BP: 127/64 (!) 145/80 (!) 141/87 (!) 150/91  Pulse: 69 76 75 74  Resp: 16 16 15 16   Temp: 97.7 F (36.5 C)     SpO2: 96% 100% 96% 97%  Weight: 150 lb (68 kg)     Height: 5\' 11"  (1.803 m)        Start Time: 0821 hrs. End Time: 0830 hrs.  Imaging Guidance (Spinal):          Type of Imaging Technique: Fluoroscopy Guidance (Spinal) Indication(s): Assistance in needle guidance and placement for procedures requiring needle placement in or near specific anatomical locations not easily accessible without such assistance. Exposure Time: Please see nurses notes. Contrast: None used. Fluoroscopic Guidance: I was personally present during the use of fluoroscopy. "Tunnel Vision Technique" used to obtain the best possible view of the target area. Parallax error corrected before commencing the procedure. "Direction-depth-direction" technique used to introduce the needle under continuous pulsed fluoroscopy. Once target was reached, antero-posterior, oblique, and lateral fluoroscopic projection used confirm needle placement in all planes. Images permanently stored in EMR. Interpretation: No contrast injected. I personally interpreted the imaging intraoperatively. Adequate needle placement confirmed in multiple planes. Permanent images saved into the patient's record.   Post-operative Assessment:  Post-procedure Vital Signs:  Pulse/HCG Rate: 74 (nsr)  Temp: 97.7 F (36.5 C) Resp: 16 BP:  (!) 150/91 SpO2: 97 %  EBL: None  Complications: No immediate post-treatment complications observed by team, or reported by patient.  Note:  The patient tolerated the entire procedure well. A repeat set of vitals were taken after the procedure and the patient was kept under observation following institutional policy, for this type of procedure. Post-procedural neurological assessment was performed, showing return to baseline, prior to discharge. The patient was provided with post-procedure discharge instructions, including a section on how to identify potential problems. Should any problems arise concerning this procedure, the patient was given instructions to immediately contact us, at any time, without hesitation. In any case, we plan to contact the patient by telephone for a follow-up status report regarding this interventional procedure.  Comments:  No additional relevant information.  Plan of Care  Orders:  Orders Placed This Encounter  Procedures   DG PAIN [facility] C-ARM 1-60 MIN NO REPORT    Intraoperative interpretation by procedural physician at [place].    Standing Status:   Standing    Number of Occurrences:   1    Order Specific Question:   Reason for exam:    Answer:   Assistance in needle guidance and placement for procedures requiring needle placement in or near specific anatomical locations not easily accessible without such assistance.   Medications ordered for procedure: Meds ordered this encounter  Medications   lidocaine (XYLOCAINE) 2 % (with pres) injection 400 mg   dexamethasone (DECADRON) injection 10 mg   dexamethasone (DECADRON) injection 10 mg   ropivacaine (PF) 2 mg/mL (0.2%) (NAROPIN) injection 9 mL   ropivacaine (PF) 2 mg/mL (0.2%) (NAROPIN) injection 9 mL   Medications administered: We administered lidocaine, dexamethasone, dexamethasone, ropivacaine (PF) 2 mg/mL (0.2%), and ropivacaine (PF) 2 mg/mL (0.2%).  See the medical record for exact dosing, route, and time of administration.  Follow-up plan:   Return in about 4 weeks (around 11/11/2021) for Post Procedure Evaluation, virtual.        L4/5 ESI 09/11/21, B/L L3,4,5 MBNB #1 19/23  Recent Visits Date Type Provider Dept  10/10/21 Office Visit Gillis Santa, MD [facility]  09/11/21 Procedure visit Gillis Santa, MD [facility]  08/22/21 Office Visit Gillis Santa, MD [facility]  Showing recent visits within past 90 days and meeting all other requirements Today's Visits Date Type Provider Dept  10/14/21 Procedure visit Gillis Santa, MD [facility]  Showing today's visits and meeting all other requirements Future Appointments Date Type Provider Dept  11/11/21 Appointment Gillis Santa, MD [facility]  Showing future appointments within next 90 days and meeting all other requirements  Disposition: Discharge home  Discharge (Date   Time): 10/14/2021; 0834 hrs.   Primary Care Physician: Jerrol Banana., MD Location: [facility] Note by: Gillis Santa, MD Date: 10/14/2021; Time: 8:55 AM  Disclaimer:  Medicine is not an exact science. The only guarantee in medicine is that nothing is guaranteed. It is important to note that the decision to proceed with this intervention was based on the information collected from the patient. The Data and conclusions were drawn from the patient's questionnaire, the interview, and the physical examination. Because the information was provided in large part by the patient, it cannot be guaranteed that it has not been purposely or unconsciously manipulated. Every effort has been made to obtain as much relevant data as possible for this evaluation. It is important to note that the conclusions that lead to this procedure are derived in large part from the available data. Always take into account that the treatment will also be dependent on availability of resources and existing treatment guidelines, considered by other Pain Management Practitioners as being common knowledge and practice, at the time of the  intervention. For Medico-Legal purposes, it is also important to point out that variation in procedural techniques and pharmacological choices are the acceptable norm. The indications, contraindications, technique, and results of the above procedure should only be interpreted and judged by a Board-Certified Interventional Pain Specialist with extensive familiarity and expertise in the same exact procedure and technique.

## 2021-10-14 NOTE — Progress Notes (Signed)
Safety precautions to be maintained throughout the outpatient stay will include: orient to surroundings, keep bed in low position, maintain call bell within reach at all times, provide assistance with transfer out of bed and ambulation.  

## 2021-10-15 ENCOUNTER — Telehealth: Payer: Self-pay | Admitting: *Deleted

## 2021-10-15 NOTE — Telephone Encounter (Signed)
No problems post procedure. 

## 2021-10-28 ENCOUNTER — Telehealth: Payer: Self-pay

## 2021-10-28 NOTE — Progress Notes (Signed)
Chronic Care Management APPOINTMENT REMINDER   Called Anthony Craig, No answer, left message of appointment on 10/29/2021 at 3:00 pm via office visit with [name] , Pharm D. Notified to have all medications, supplements, blood pressure and/or blood sugar logs available during appointment and to return call if need to reschedule.  [place] Pharmacist Assistant [phone]

## 2021-10-29 ENCOUNTER — Ambulatory Visit (INDEPENDENT_AMBULATORY_CARE_PROVIDER_SITE_OTHER): Payer: Medicare PPO

## 2021-10-29 DIAGNOSIS — Z794 Long term (current) use of insulin: Secondary | ICD-10-CM

## 2021-10-29 DIAGNOSIS — E1159 Type 2 diabetes mellitus with other circulatory complications: Secondary | ICD-10-CM

## 2021-10-29 DIAGNOSIS — E7800 Pure hypercholesterolemia, unspecified: Secondary | ICD-10-CM

## 2021-10-29 NOTE — Patient Instructions (Signed)
Visit Information It was great speaking with you today!  Please let me know if you have any questions about our visit.  Plan:  Avoid stacking doses of insulin. Do not give insulin more often than every 3 hours to prevent low blood sugar You can try meclizine 25 mg up to twice daily as needed for vertigo. If you are having symptoms more often than three times weekly, please let us know  Print copy of patient instructions, educational materials, and care plan provided in person.  Face to Face appointment with pharmacist scheduled for:  04/29/2022 at 3:00 PM  [name], PharmD, [place], [place] Pharmacist Practitioner  [facility] [phone]

## 2021-10-29 NOTE — Progress Notes (Signed)
Chronic Care Management Pharmacy Note  11/04/2021 Name:  Anthony Craig MRN:  [id number] DOB:  1927-12-19  Summary: Patient presents today for CCM follow-up. He has had a few instances of hypoglycemia 1-2 times weekly. Often occurs when blood sugars elevated and patient takes small dose of insulin to bring his numbers down.   -Query appropriate use of lovaza in this 88 year old patient given history of excellent triglyceride control.     Recommendations/Changes made from today's visit: Continue current medications  Plan: CPP follow-up in 6 months   Subjective: Anthony Craig is an 88 y.o. year old male who is a primary patient of Jerrol Banana., MD.  The CCM team was consulted for assistance with disease management and care coordination needs.    Engaged with patient by telephone for follow up visit in response to provider referral for pharmacy case management and/or care coordination services.   Consent to Services:  The patient was given information about Chronic Care Management services, agreed to services, and gave verbal consent prior to initiation of services.  Please see initial visit note for detailed documentation.   Patient Care Team: Jerrol Banana., MD as PCP - General (Family Medicine) Benito Mccreedy, MD as Referring Physician (Family Medicine) Dingeldein, Remo Lipps, MD (Ophthalmology) Gabriel Carina Betsey Holiday, MD as Physician Assistant (Endocrinology) Germaine Pomfret, [facility] as Pharmacist (Pharmacist)  Recent office visits: 12/10/20: Patient presnted to Dr. Rosanna Randy for follow-up. A1c 8.2%.   Recent consult visits: None in past 6 months  Hospital visits: None in previous 6 months   Objective:  Lab Results  Component Value Date   CREATININE 1.0 08/26/2021   BUN 22 (A) 08/26/2021   GFRNONAA 71 08/26/2021   GFRAA 80 07/16/2020   NA 135 07/28/2021   K 4.9 08/26/2021   CALCIUM 9.4 08/26/2021   CO2 29 (A) 08/26/2021   GLUCOSE 142 (H)  07/28/2021    Lab Results  Component Value Date/Time   HGBA1C 8.0 08/26/2021 12:00 AM   HGBA1C 8.2 (A) 12/10/2020 09:41 AM   HGBA1C 8.4 (H) 07/16/2020 09:15 AM   HGBA1C 7.6 (A) 03/13/2020 02:11 PM   HGBA1C 7.6 (H) 11/23/2019 03:52 PM   HGBA1C 7.4 03/09/2017 12:00 AM    Last diabetic Eye exam:  Lab Results  Component Value Date/Time   HMDIABEYEEXA No Retinopathy 04/24/2020 12:00 AM    Last diabetic Foot exam: No results found for: HMDIABFOOTEX   Lab Results  Component Value Date   CHOL 123 08/26/2021   HDL 44 08/26/2021   LDLCALC 59 08/26/2021   TRIG 137 08/26/2021   CHOLHDL 2.6 07/16/2020    Hepatic Function Latest Ref Rng & Units 08/26/2021 07/16/2020 11/23/2019  Total Protein 6.0 - 8.5 g/dL - 7.1 6.8  Albumin 3.5 - 5.0 3.8 4.5 4.4  AST 14 - 40 '21 13 18  ' ALT 10 - 40 '29 12 13  ' Alk Phosphatase 25 - 125 123 93 91  Total Bilirubin 0.0 - 1.2 mg/dL - 0.4 0.3  Bilirubin, Direct 0.00 - 0.40 mg/dL - - -    Lab Results  Component Value Date/Time   TSH 1.220 08/17/2019 08:15 AM   TSH 0.711 03/15/2015 04:20 PM   FREET4 1.20 03/15/2015 04:20 PM    CBC Latest Ref Rng & Units 08/26/2021 07/28/2021 07/16/2020  WBC - 8.2 6.9 4.9  Hemoglobin 13.5 - 17.5 14.8 14.7 14.4  Hematocrit 41 - 53 46 43.0 44.4  Platelets 150 - 399 212 174 217  Lab Results  Component Value Date/Time   VD25OH 37.9 08/26/2021 12:00 AM    Clinical ASCVD: Yes  The ASCVD Risk score (Arnett DK, et al., 2019) failed to calculate for the following reasons:   The 2019 ASCVD risk score is only valid for ages 10 to 86   The patient has a prior MI or stroke diagnosis    Depression screen [facility] 2/9 10/14/2021 08/22/2021 08/07/2021  Decreased Interest 0 0 0  Down, Depressed, Hopeless 0 1 2  PHQ - 2 Score 0 1 2  Altered sleeping - - 1  Tired, decreased energy - - 1  Change in appetite - - 0  Feeling bad or failure about yourself  - - 0  Trouble concentrating - - 0  Moving slowly or fidgety/restless - - 0   Suicidal thoughts - - 0  PHQ-9 Score - - 4  Difficult doing work/chores - - Not difficult at all   Social History   Tobacco Use  Smoking Status Former   Packs/day: 0.50   Years: 30.00   Pack years: 15.00   Types: Cigarettes   Quit date: 10/06/1979   Years since quitting: 42.1  Smokeless Tobacco Never   BP Readings from Last 3 Encounters:  10/14/21 (!) 150/91  10/10/21 (!) 143/63  09/11/21 (!) 165/97   Pulse Readings from Last 3 Encounters:  10/14/21 74  10/10/21 71  09/11/21 77   Wt Readings from Last 3 Encounters:  10/14/21 150 lb (68 kg)  10/10/21 150 lb (68 kg)  09/11/21 150 lb (68 kg)   BMI Readings from Last 3 Encounters:  10/14/21 20.92 kg/m  10/10/21 20.92 kg/m  09/11/21 20.92 kg/m    Assessment/Interventions: Review of patient past medical history, allergies, medications, health status, including review of consultants reports, laboratory and other test data, was performed as part of comprehensive evaluation and provision of chronic care management services.   SDOH:  (Social Determinants of Health) assessments and interventions performed: Yes  SDOH Screenings   Alcohol Screen: Low Risk    Last Alcohol Screening Score (AUDIT): 1  Depression (PHQ2-9): Low Risk    PHQ-2 Score: 0  Financial Resource Strain: Low Risk    Difficulty of Paying Living Expenses: Not hard at all  Food Insecurity: Not on file  Housing: Not on file  Physical Activity: Not on file  Social Connections: Not on file  Stress: Not on file  Tobacco Use: Medium Risk   Smoking Tobacco Use: Former   Smokeless Tobacco Use: Never   Passive Exposure: Not on file  Transportation Needs: Not on file    [place]  Allergies  Allergen Reactions   Doxycycline Other (See Comments)    Reaction: per [place] patient called on 11/30/14 stating he was having a reaction to a medication possibly including redness, swelling and itching, he was not sure at that time if it was this medication or  Hydrocodone-homatropin.    Hydrocodone Bit-Homatrop Mbr Other (See Comments)    Reaction:  Reaction: per [place] patient called on 11/30/14 stating he was having a reaction to a medication possibly including redness, swelling and itching, he was not sure at that time if it was this medication or Doxy that he was taking at the same time. Patient has been taking Hydrocodone with no problems.    Medications Reviewed Today     Reviewed by Dewayne Shorter, RN (Registered Nurse) on 10/14/21 at [phone]  Med List Status: <None>   Medication Order Taking? Sig  Documenting Provider Last Dose Status Informant  Alcohol Swabs (B-D SINGLE USE SWABS REGULAR) PADS [id number] Yes USE AS DIRECTED  WITH  EACH  FINGERSTICK Jerrol Banana., MD Taking Active   aspirin EC 81 MG tablet [id number] Yes Take 81 mg by mouth daily. [provider] Taking Active Self  atorvastatin (LIPITOR) 40 MG tablet [id number] Yes TAKE 1 TABLET BY MOUTH EVERYDAY AT BEDTIME  Patient taking differently: 20 mg.   Jerrol Banana., MD Taking Active            Med Note [facility], TED E   Wed Jun 20, 2020  8:18 PM)    b complex vitamins tablet [id number] Yes Take 1 tablet by mouth daily. [provider] Taking Active   calcium-vitamin D 250-100 MG-UNIT per tablet [id number] Yes Take 1 tablet by mouth daily. [provider] Taking Active   cholecalciferol (VITAMIN D) 1000 UNITS tablet [id number] Yes Take 1,000 Units by mouth daily. [provider] Taking Active   Coenzyme Q10 (COQ10) 200 MG CAPS [id number] Yes Take 100 mg by mouth daily.  [provider] Taking Active Self  diphenhydrAMINE (BENADRYL) 25 MG tablet [id number] Yes Take 25 mg by mouth every 6 (six) hours as needed. [provider] Taking Active            Med Note Emmaline Kluver Nov 10, 2016  3:04 PM) prn  diphenoxylate-atropine (LOMOTIL) 2.5-0.025 MG per tablet [id number] Yes Take 1 tablet by mouth 4 (four)  times daily as needed for diarrhea or loose stools. [provider] Taking Active Self  famotidine (PEPCID) 20 MG tablet [id number] Yes Take 1 tablet (20 mg total) by mouth 2 (two) times daily. Carrie Mew, MD Taking Active   gabapentin (NEURONTIN) 100 MG capsule [id number] Yes Take 1-2 capsules (100-200 mg total) by mouth at bedtime. Follow written titration schedule. Gillis Santa, MD Taking Active   Glucosamine-Chondroitin (OSTEO BI-FLEX REGULAR STRENGTH PO) [id number] Yes Take 1 tablet by mouth in the morning and at bedtime. [provider] Taking Active   HYDROcodone-acetaminophen (NORCO/VICODIN) 5-325 MG tablet [id number] No Take 1-2 tablets by mouth every 4 (four) hours as needed for moderate pain.  Patient not taking: Reported on 10/14/2021   Jerrol Banana., MD Not Taking Active   hydrocortisone 2.5 % lotion [id number] Yes Apply 1 application topically 2 (two) times daily. To affected areas of face only as needed for flares of itching/scaling  [provider] Taking Active   hydroxypropyl methylcellulose / hypromellose (ISOPTO TEARS / GONIOVISC) 2.5 % ophthalmic solution [id number] Yes Place 1 drop into both eyes 4 (four) times daily as needed for dry eyes. [provider] Taking Active   insulin aspart protamine - aspart (NOVOLOG MIX 70/30 FLEXPEN) (70-30) 100 UNIT/ML FlexPen [id number] Yes 12 units in the am and 6 units after dinner. [provider] Taking Active   Krill Oil 500 MG CAPS [id number] Yes Take 1 capsule by mouth daily. Joline Salt [provider] Taking Active Self  lipase/protease/amylase (CREON) 36000 UNITS CPEP capsule [id number] Yes Take 36,000 Units by mouth. [provider] Taking Active   Misc Natural Products (OSTEO BI-FLEX ADV JOINT SHIELD PO) [id number] Yes Take by mouth 2 (two) times daily. [provider] Taking Active   Multiple Vitamins-Minerals (MULTIVITAMIN PO) [id number] Yes Take 1 tablet by  mouth daily. [provider] Taking Active Self  Multiple Vitamins-Minerals (PRESERVISION AREDS PO) [id number] Yes Take by mouth.  [provider]  Taking Active   omega-3 acid ethyl esters (LOVAZA) 1 g capsule [id number] Yes Take by mouth 2 (two) times daily.  [provider] Taking Active   pantoprazole (PROTONIX) 40 MG tablet [id number] Yes TAKE 1 TABLET BY MOUTH EVERY DAY Jerrol Banana., MD Taking Active   primidone (MYSOLINE) 50 MG tablet [id number] Yes Take 1 tablet (50 mg total) by mouth 4 (four) times daily.  Patient taking differently: Take 50 mg by mouth at bedtime.   Jerrol Banana., MD Taking Active   sertraline (ZOLOFT) 25 MG tablet [id number] Yes Take 1 tablet (25 mg total) by mouth daily. Jerrol Banana., MD Taking Active   ursodiol (ACTIGALL) 300 MG capsule [id number] Yes Take 300 mg by mouth 2 (two) times daily. [provider] Taking Active   vitamin B-12 (CYANOCOBALAMIN) 1000 MCG tablet [id number] Yes Take 5,000 mcg by mouth daily.  [provider] Taking Active Self            Patient Active Problem List   Diagnosis Date Noted   Lumbar radiculopathy 08/22/2021   Spinal stenosis, lumbar region, with neurogenic claudication 08/22/2021   Lumbar degenerative disc disease 08/22/2021   Lumbar spondylosis 08/22/2021   Chronic hepatitis ([place]) 08/11/2021   Viral upper respiratory tract infection 11/28/2020   Chest pain 04/27/2015   Allergic drug reaction 02/08/2015   B12 deficiency 02/08/2015   Cervical nerve root disorder 02/08/2015   Chest discomfort 02/08/2015   Vertigo 02/08/2015   Benign essential tremor 02/08/2015   Fatigue 02/08/2015   Acid reflux 02/08/2015   HLD (hyperlipidemia) 02/08/2015   BP (high blood pressure) 02/08/2015   Hernia, inguinal, left 02/08/2015   Cannot sleep 02/08/2015   CA of prostate ([place]) 02/08/2015   Type 2 diabetes mellitus with other circulatory complication, with long-term  current use of insulin ([place])    Chronic pancreatitis, unspecified pancreatitis type ([place])     Immunization History  Administered Date(s) Administered   Fluad Quad(high Dose 65+) 07/11/2020   Influenza, High Dose Seasonal PF 06/14/2019   Influenza,inj,Quad PF,6-35 Mos 07/01/2017   Influenza-Unspecified 07/09/2015   PFIZER(Purple Top)SARS-COV-2 Vaccination 10/20/2019, 11/08/2019, 09/13/2020   Pneumococcal Conjugate-13 11/14/2013   Pneumococcal Polysaccharide-23 06/16/2007, 06/08/2010   Td 06/08/2006   Tdap 07/01/2017   Zoster Recombinat (Shingrix) 12/04/2020    Conditions to be addressed/monitored:  Hypertension, Hyperlipidemia, and Diabetes  Care Plan : General Pharmacy (Adult)  Updates made by Germaine Pomfret, [place] since 11/04/2021 12:00 AM     Problem: Hypertension, Hyperlipidemia, and Diabetes   Priority: High     Long-Range Goal: Patient-Specific Goal   Start Date: 05/03/2021  Expected End Date: 11/03/2021  This Visit's Progress: On track  Recent Progress: On track  Priority: High  Note:   Current Barriers:  No barriers noted  Pharmacist Clinical Goal(s):  Patient will maintain control of diabetes as evidenced by A1c less than 8%  maintain control of hyperlipidemia as evidenced by LDL less than 100 through collaboration with PharmD and provider.   Interventions: 1:1 collaboration with Jerrol Banana., MD regarding development and update of comprehensive plan of care as evidenced by provider attestation and co-signature Inter-disciplinary care team collaboration (see longitudinal plan of care) Comprehensive medication review performed; medication list updated in electronic medical record  Hypertension (BP goal <140/90) -Controlled -Current treatment: None -Medications previously tried: NA  -Denies hypotensive symptoms:  -Educated on Daily salt intake goal < 2300 mg; -Counseled on diet and exercise extensively  Hyperlipidemia: (LDL  goal <  100) -Controlled -Current treatment: Atorvastatin 40 mg nightly: Appropriate, Effective, Safe, Accessible Lovaza 1g twice daily: Query appropriate -Medications previously tried: NA  -Query appropriate use of lovaza in this 88 year old patient given history of excellent triglyceride control.  -Recommended to continue current medication  Diabetes (A1c goal <8%) -Controlled -Current medications: Novolog 70/30 12 units AM, 6 units PM, will give extra 4 units if sugars are elevated.  -Medications previously tried: NA  -Current home glucose readings: Uses Dexcom G6 -Reports hypoglycemic symptoms: 1-2 times weekly. Often occurs when blood sugars elevated and patient takes small dose of insulin to bring his numbers down.  -Current exercise: Exercises 3x weekly gym.  -Counseled patient to avoid dose stacking.  -Recommended to continue current medication  Osteoarthritis (Goal: Minimize arthritis pain) -Controlled -Current treatment  Osteo Bi-Flex twice daily  -Medications previously tried: NA -Patient reports supplementation has improved his arthritis pain.  -Recommend Acetaminophen CR 650 mg every 8 hours as needed. Counseled to avoid >3000 mg acetaminophen daily  -Recommended to continue current medication  Patient Goals/Self-Care Activities Patient will:  - check glucose at least four times daily, document, and provide at future appointments check blood pressure weekly, document, and provide at future appointments  Follow Up Plan: Face to Face appointment with care management team member scheduled for: 04/29/2022 at 3:45 PM      Medication Assistance: None required.  Patient affirms current coverage meets needs.  Compliance/Adherence/Medication fill history: Care Gaps: FOOT EXAM URINE MICROALBUMIN COVID-19 Vaccine Booster 4 Zoster Vaccines- Shingrix  (1st one given on 12/04/2020) OPHTHALMOLOGY EXAM (last completed 04/24/2020)  Star-Rating Drugs: Atorvastatin 40 mg last filled  on 10/11/2019 for a 90-Day supply with [facility]  Patient's preferred pharmacy is:  [facility] [address], [place] - [place] [address] Phone: [phone] Fax: [place], [place] [address] Phone: [phone] Fax: [phone]  Uses pill box? Yes Pt endorses 100% compliance  We discussed: Current pharmacy is preferred with insurance plan and patient is satisfied with pharmacy services Patient decided to: Continue current medication management strategy  Care Plan and Follow Up Patient Decision:  Patient agrees to Care Plan and Follow-up.  Plan: Face to Face appointment with care management team member scheduled for: 04/29/2022 at 3:45 PM  [name], PharmD, [facility] [place], [place] [phone]

## 2021-11-05 DIAGNOSIS — Z87891 Personal history of nicotine dependence: Secondary | ICD-10-CM | POA: Diagnosis not present

## 2021-11-05 DIAGNOSIS — Z794 Long term (current) use of insulin: Secondary | ICD-10-CM

## 2021-11-05 DIAGNOSIS — E7800 Pure hypercholesterolemia, unspecified: Secondary | ICD-10-CM

## 2021-11-05 DIAGNOSIS — E1159 Type 2 diabetes mellitus with other circulatory complications: Secondary | ICD-10-CM

## 2021-11-07 ENCOUNTER — Telehealth: Payer: Self-pay

## 2021-11-07 NOTE — Telephone Encounter (Signed)
LM for patient to call office for pre virtual appointment questions.  

## 2021-11-08 ENCOUNTER — Encounter: Payer: Self-pay | Admitting: Student in an Organized Health Care Education/Training Program

## 2021-11-11 ENCOUNTER — Ambulatory Visit
Payer: No Typology Code available for payment source | Attending: Student in an Organized Health Care Education/Training Program | Admitting: Student in an Organized Health Care Education/Training Program

## 2021-11-11 ENCOUNTER — Encounter: Payer: Self-pay | Admitting: Student in an Organized Health Care Education/Training Program

## 2021-11-11 ENCOUNTER — Other Ambulatory Visit: Payer: Self-pay

## 2021-11-11 DIAGNOSIS — G894 Chronic pain syndrome: Secondary | ICD-10-CM

## 2021-11-11 DIAGNOSIS — M48062 Spinal stenosis, lumbar region with neurogenic claudication: Secondary | ICD-10-CM

## 2021-11-11 DIAGNOSIS — M5416 Radiculopathy, lumbar region: Secondary | ICD-10-CM | POA: Diagnosis not present

## 2021-11-11 DIAGNOSIS — M5136 Other intervertebral disc degeneration, lumbar region: Secondary | ICD-10-CM | POA: Diagnosis not present

## 2021-11-11 DIAGNOSIS — M51369 Other intervertebral disc degeneration, lumbar region without mention of lumbar back pain or lower extremity pain: Secondary | ICD-10-CM

## 2021-11-11 DIAGNOSIS — M47816 Spondylosis without myelopathy or radiculopathy, lumbar region: Secondary | ICD-10-CM

## 2021-11-11 NOTE — Progress Notes (Signed)
Patient: Anthony Craig  Service Category: E/M  Provider: Gillis Santa, MD  DOB: 27-Apr-1928  DOS: 11/11/2021  Location: Office  MRN: [id number]  Setting: Ambulatory outpatient  Referring Provider: Jerrol Banana.,*  Type: Established Patient  Specialty: Interventional Pain Management  PCP: Anthony Banana., MD  Location: Home  Delivery: TeleHealth     Virtual Encounter - Pain Management PROVIDER NOTE: Information contained herein reflects review and annotations entered in association with encounter. Interpretation of such information and data should be left to medically-trained personnel. Information provided to patient can be located elsewhere in the medical record under "Patient Instructions". Document created using STT-dictation technology, any transcriptional errors that may result from process are unintentional.    Contact & Pharmacy Preferred: [phone] Home: [phone] (home) Mobile: [phone] (mobile) E-mail: No e-mail address on record  [place], [place] - [place] [address] Phone: [phone] Fax: [place], [place] [address] [place] [address] Phone: [phone] Fax: [phone]   Pre-screening  Anthony Craig offered "in-person" vs "virtual" encounter. He indicated preferring virtual for this encounter.   Reason COVID-19*   Social distancing based on CDC and AMA recommendations.   I contacted Anthony Craig on 11/11/2021 via telephone.      I clearly identified myself as Gillis Santa, MD. I verified that I was speaking with the correct person using two identifiers (Name: Anthony Craig, and date of birth: [birth year]).  Consent I sought verbal advanced consent from Anthony Craig for virtual visit interactions. I informed Anthony Craig of possible security and privacy concerns, risks, and limitations associated with providing  "not-in-person" medical evaluation and management services. I also informed Anthony Craig of the availability of "in-person" appointments. Finally, I informed him that there would be a charge for the virtual visit and that he could be  personally, fully or partially, financially responsible for it. Anthony Craig expressed understanding and agreed to proceed.   Historic Elements   Anthony Craig is a 88 y.o. year old, male patient evaluated today after our last contact on 10/14/2021. Anthony Craig  has a past medical history of Cancer [facility]), Coronary artery disease, Diabetes mellitus without complication ([place]), Hyperlipidemia, and Hypertension. He also  has a past surgical history that includes Hernia repair (Bilateral); Whipple procedure (2007); Coronary artery bypass graft; Appendectomy; and Biliary drainage. Anthony Craig has a current medication list which includes the following prescription(s): b-d single use swabs regular, aspirin ec, atorvastatin, b complex vitamins, calcium-vitamin d, cholecalciferol, coq10, diphenhydramine, diphenoxylate-atropine, famotidine, glucosamine-chondroitin, hydrocortisone, hydroxypropyl methylcellulose / hypromellose, novolog mix 70/30 flexpen, krill oil, lipase/protease/amylase, misc natural products, multiple vitamin, multiple vitamins-minerals, omega-3 acid ethyl esters, pantoprazole, primidone, sertraline, ursodiol, vitamin b-12, and gabapentin. He  reports that he quit smoking about 42 years ago. His smoking use included cigarettes. He has a 15.00 pack-year smoking history. He has never used smokeless tobacco. He reports current alcohol use. He reports that he does not use drugs. Anthony Craig is allergic to doxycycline and hydrocodone bit-homatrop mbr.   HPI  Today, he is being contacted for a post-procedure assessment.   Post-procedure evaluation    Type: Lumbar Facet, Medial Branch Block(s) #1  Laterality: Bilateral Level: L3, L4, L5, Medial Branch Level(s).  Injecting these levels blocks the L3-4, L4-5, lumbar facet joints. Imaging: Fluoroscopic guidance Anesthesia: Local anesthesia (1-2% Lidocaine) Anxiolysis: None  Sedation: None.  DOS: 10/14/2021 Performed by: Gillis Santa, MD  Primary Purpose: Diagnostic/Therapeutic Indications: Low back pain severe enough to impact quality of life or function. 1. Lumbar degenerative disc disease   2. Lumbar spondylosis   3. Spinal stenosis, lumbar region, with neurogenic claudication    NAS-11 Pain score:   Pre-procedure: 2 /10   Post-procedure: 0-No pain/10      Effectiveness:  Initial hour after procedure: 20 %  Subsequent 4-6 hours post-procedure: 30 %  Analgesia past initial 6 hours: 80% for the first  2 weeks then down to 65 %  Ongoing improvement:  Analgesic:  65% up until last week now pain is returning Function: Somewhat improved ROM: Somewhat improved  Repeat diagnostic lumbar medial branch nerve block #2 Plan for lumbar radiofrequency ablation thereafter  Laboratory Chemistry Profile   Renal Lab Results  Component Value Date   BUN 22 (A) 08/26/2021   CREATININE 1.0 08/26/2021   BCR 14 07/16/2020   GFRAA 80 07/16/2020   GFRNONAA 71 08/26/2021    Hepatic Lab Results  Component Value Date   AST 21 08/26/2021   ALT 29 08/26/2021   ALBUMIN 3.8 08/26/2021   ALKPHOS 123 08/26/2021   AMYLASE 50 09/10/2016   LIPASE 7 (L) 11/23/2019    Electrolytes Lab Results  Component Value Date   NA 135 07/28/2021   K 4.9 08/26/2021   CL 105 08/26/2021   CALCIUM 9.4 08/26/2021   MG 1.5 (L) 01/17/2014    Bone Lab Results  Component Value Date   VD25OH 37.9 08/26/2021    Inflammation (CRP: Acute Phase) (ESR: Chronic Phase) No results found for: CRP, ESRSEDRATE, LATICACIDVEN       Note: Above Lab results reviewed.  Assessment  The primary encounter diagnosis was Lumbar spondylosis. Diagnoses of Spinal stenosis, lumbar region, with neurogenic claudication, Lumbar  degenerative disc disease, Lumbar radiculopathy, and Chronic pain syndrome were also pertinent to this visit.  Plan of Care   Positive response to first set of diagnostic lumbar facet medial branch nerve blocks bilaterally L3, L4, L5.  See above. Recommend repeating bilateral L3, L4, L5 medial branch nerve block #2. Consider lumbar radiofrequency ablation afterwards.  Orders:  Orders Placed This Encounter  Procedures   LUMBAR FACET(MEDIAL BRANCH NERVE BLOCK) MBNB    Standing Status:   Future    Standing Expiration Date:   12/09/2021    Scheduling Instructions:     Procedure: Lumbar facet block (AKA.: Lumbosacral medial branch nerve block)     Side: Bilateral     Level: L3-4, L5-S1 Facets (L3, L4, L5,edial Branch Nerves)     Sedation: without     Timeframe: ASAA    Order Specific Question:   Where will this procedure be performed?    Answer:   [facility]   Follow-up plan:   Return in about 2 days (around 11/13/2021) for B/L L3, 4, 5 MBNB #2 , in clinic NS.      L4/5 ESI 09/11/21, B/L L3,4,5 MBNB #1 10/14/21     Recent Visits Date Type Provider Dept  10/14/21 Procedure visit Gillis Santa, MD [facility]  10/10/21 Office Visit Gillis Santa, MD [facility]  09/11/21 Procedure visit Gillis Santa, MD [facility]  08/22/21 Office Visit Gillis Santa, MD [facility]  Showing recent visits within past 90 days and meeting all other requirements Today's Visits Date Type Provider Dept  11/11/21 Office Visit Gillis Santa, MD [facility]  Showing today's  visits and meeting all other requirements Future Appointments No visits were found meeting these conditions. Showing future appointments within next 90 days and meeting all other requirements  I discussed the assessment and treatment plan with the patient. The patient was provided an opportunity to ask questions and all were answered. The patient agreed with the plan and  demonstrated an understanding of the instructions.  Patient advised to call back or seek an in-person evaluation if the symptoms or condition worsens.  Duration of encounter: 45mnutes.  Note by: BGillis Santa MD Date: 11/11/2021; Time: 12:12 PM

## 2021-11-13 ENCOUNTER — Encounter: Payer: Self-pay | Admitting: Student in an Organized Health Care Education/Training Program

## 2021-11-13 ENCOUNTER — Ambulatory Visit (HOSPITAL_BASED_OUTPATIENT_CLINIC_OR_DEPARTMENT_OTHER)
Payer: No Typology Code available for payment source | Admitting: Student in an Organized Health Care Education/Training Program

## 2021-11-13 ENCOUNTER — Other Ambulatory Visit: Payer: Self-pay

## 2021-11-13 ENCOUNTER — Ambulatory Visit
Admission: RE | Admit: 2021-11-13 | Discharge: 2021-11-13 | Disposition: A | Discharge status: Home / Self Care | Payer: No Typology Code available for payment source | Source: Ambulatory Visit | Attending: Student in an Organized Health Care Education/Training Program | Admitting: Student in an Organized Health Care Education/Training Program

## 2021-11-13 VITALS — BP 158/90 | HR 73 | Temp 97.3°F | Resp 13 | Ht 71.0 in | Wt 150.0 lb

## 2021-11-13 DIAGNOSIS — M47816 Spondylosis without myelopathy or radiculopathy, lumbar region: Secondary | ICD-10-CM

## 2021-11-13 DIAGNOSIS — G894 Chronic pain syndrome: Secondary | ICD-10-CM | POA: Diagnosis not present

## 2021-11-13 MED ORDER — DEXAMETHASONE SODIUM PHOSPHATE 10 MG/ML IJ SOLN
10.0000 mg | Freq: Once | INTRAMUSCULAR | Status: AC
  Administered 2021-11-13: 10 mg

## 2021-11-13 MED ORDER — LIDOCAINE HCL 2 % IJ SOLN
INTRAMUSCULAR | Status: AC
  Filled 2021-11-13: qty 20

## 2021-11-13 MED ORDER — ROPIVACAINE HCL 2 MG/ML IJ SOLN
INTRAMUSCULAR | Status: AC
  Filled 2021-11-13: qty 20

## 2021-11-13 MED ORDER — ROPIVACAINE HCL 2 MG/ML IJ SOLN
9.0000 mL | Freq: Once | INTRAMUSCULAR | Status: AC
  Administered 2021-11-13: 9 mL via PERINEURAL

## 2021-11-13 MED ORDER — DEXAMETHASONE SODIUM PHOSPHATE 10 MG/ML IJ SOLN
INTRAMUSCULAR | Status: AC
  Filled 2021-11-13: qty 2

## 2021-11-13 MED ORDER — LIDOCAINE HCL 2 % IJ SOLN
20.0000 mL | Freq: Once | INTRAMUSCULAR | Status: AC
  Administered 2021-11-13: 400 mg

## 2021-11-13 NOTE — Progress Notes (Signed)
Safety precautions to be maintained throughout the outpatient stay will include: orient to surroundings, keep bed in low position, maintain call bell within reach at all times, provide assistance with transfer out of bed and ambulation.  

## 2021-11-13 NOTE — Patient Instructions (Addendum)

## 2021-11-13 NOTE — Progress Notes (Signed)
PROVIDER NOTE: Interpretation of information contained herein should be left to medically-trained personnel. Specific patient instructions are provided elsewhere under "Patient Instructions" section of medical record. This document was created in part using STT-dictation technology, any transcriptional errors that may result from this process are unintentional.  Patient: Anthony Craig Type: Established DOB: 10/10/1927 MRN: [id number] PCP: Jerrol Banana., MD  Service: Procedure DOS: 11/13/2021 Setting: Ambulatory Location: Ambulatory outpatient facility Delivery: Face-to-face Provider: Gillis Santa, MD Specialty: Interventional Pain Management Specialty designation: 09 Location: Outpatient facility Ref. Prov.: Jerrol Banana.,*    Primary Reason for Visit: Interventional Pain Management Treatment. CC: Back Pain  Procedure:           Type: Lumbar Facet, Medial Branch Block(s) #2  Laterality: Bilateral Level: L3, L4, L5, Medial Branch Level(s). Injecting these levels blocks the L3-4, L4-5, lumbar facet joints. Imaging: Fluoroscopic guidance Anesthesia: Local anesthesia (1-2% Lidocaine) Anxiolysis: None         Sedation: None.  DOS: 11/13/2021 Performed by: Gillis Santa, MD  Primary Purpose: Diagnostic/Therapeutic Indications: Low back pain severe enough to impact quality of life or function. 1. Lumbar spondylosis   2. Chronic pain syndrome    NAS-11 Pain score:   Pre-procedure: 2 /10   Post-procedure: 0-No pain/10     Position / Prep / Materials:  Position: Prone  Prep solution: DuraPrep (Iodine Povacrylex [0.7% available iodine] and Isopropyl Alcohol, 74% w/w) Area Prepped: Posterolateral Lumbosacral Spine (Wide prep: From the lower border of the scapula down to the end of the tailbone and from flank to flank.)  Materials:  Tray: Block Needle(s):  Type: Spinal  Gauge (G): 22  Length: 3.5-in Qty: 3  Pre-op H&P Assessment:  Mr. Hammonds is a 88 y.o.  (year old), male patient, seen today for interventional treatment. He  has a past surgical history that includes Hernia repair (Bilateral); Whipple procedure (2007); Coronary artery bypass graft; Appendectomy; and Biliary drainage. Mr. Bomar has a current medication list which includes the following prescription(s): b-d single use swabs regular, aspirin ec, atorvastatin, b complex vitamins, calcium-vitamin d, cholecalciferol, coq10, diphenhydramine, diphenoxylate-atropine, famotidine, glucosamine-chondroitin, hydrocortisone, hydroxypropyl methylcellulose / hypromellose, novolog mix 70/30 flexpen, krill oil, lipase/protease/amylase, misc natural products, multiple vitamin, multiple vitamins-minerals, omega-3 acid ethyl esters, pantoprazole, primidone, sertraline, ursodiol, vitamin b-12, and gabapentin. His primarily concern today is the Back Pain  Initial Vital Signs:  Pulse/HCG Rate: 63  Temp: (!) 97.3 F (36.3 C) Resp: 18 BP: (!) 159/74 SpO2: 98 %  BMI: Estimated body mass index is 20.92 kg/m as calculated from the following:   Height as of this encounter: 5\' 11"  (1.803 m).   Weight as of this encounter: 150 lb (68 kg).  Risk Assessment: Allergies: Reviewed. He is allergic to doxycycline and hydrocodone bit-homatrop mbr.  Allergy Precautions: None required Coagulopathies: Reviewed. None identified.  Blood-thinner therapy: None at this time Active Infection(s): Reviewed. None identified. Mr. Sturgell is afebrile  Site Confirmation: Mr. Lopezperez was asked to confirm the procedure and laterality before marking the site Procedure checklist: Completed Consent: Before the procedure and under the influence of no sedative(s), amnesic(s), or anxiolytics, the patient was informed of the treatment options, risks and possible complications. To fulfill our ethical and legal obligations, as recommended by the American Medical Association's Code of Ethics, I have informed the patient of my clinical  impression; the nature and purpose of the treatment or procedure; the risks, benefits, and possible complications of the intervention; the alternatives, including doing nothing; the risk(s) and  benefit(s) of the alternative treatment(s) or procedure(s); and the risk(s) and benefit(s) of doing nothing. The patient was provided information about the general risks and possible complications associated with the procedure. These may include, but are not limited to: failure to achieve desired goals, infection, bleeding, organ or nerve damage, allergic reactions, paralysis, and death. In addition, the patient was informed of those risks and complications associated to Spine-related procedures, such as failure to decrease pain; infection (i.e.: Meningitis, epidural or intraspinal abscess); bleeding (i.e.: epidural hematoma, subarachnoid hemorrhage, or any other type of intraspinal or peri-dural bleeding); organ or nerve damage (i.e.: Any type of peripheral nerve, nerve root, or spinal cord injury) with subsequent damage to sensory, motor, and/or autonomic systems, resulting in permanent pain, numbness, and/or weakness of one or several areas of the body; allergic reactions; (i.e.: anaphylactic reaction); and/or death. Furthermore, the patient was informed of those risks and complications associated with the medications. These include, but are not limited to: allergic reactions (i.e.: anaphylactic or anaphylactoid reaction(s)); adrenal axis suppression; blood sugar elevation that in diabetics may result in ketoacidosis or comma; water retention that in patients with history of congestive heart failure may result in shortness of breath, pulmonary edema, and decompensation with resultant heart failure; weight gain; swelling or edema; medication-induced neural toxicity; particulate matter embolism and blood vessel occlusion with resultant organ, and/or nervous system infarction; and/or aseptic necrosis of one or more  joints. Finally, the patient was informed that Medicine is not an exact science; therefore, there is also the possibility of unforeseen or unpredictable risks and/or possible complications that may result in a catastrophic outcome. The patient indicated having understood very clearly. We have given the patient no guarantees and we have made no promises. Enough time was given to the patient to ask questions, all of which were answered to the patient's satisfaction. Mr. Kloepfer has indicated that he wanted to continue with the procedure. Attestation: I, the ordering provider, attest that I have discussed with the patient the benefits, risks, side-effects, alternatives, likelihood of achieving goals, and potential problems during recovery for the procedure that I have provided informed consent. Date   Time: 11/13/2021  7:52 AM  Pre-Procedure Preparation:  Monitoring: As per clinic protocol. Respiration, ETCO2, SpO2, BP, heart rate and rhythm monitor placed and checked for adequate function Safety Precautions: Patient was assessed for positional comfort and pressure points before starting the procedure. Time-out: I initiated and conducted the "Time-out" before starting the procedure, as per protocol. The patient was asked to participate by confirming the accuracy of the "Time Out" information. Verification of the correct person, site, and procedure were performed and confirmed by me, the nursing staff, and the patient. "Time-out" conducted as per Joint Commission's Universal Protocol (UP.01.01.01). Time: 0821  Description of Procedure:          Laterality: Bilateral. The procedure was performed in identical fashion on both sides. Levels:  L3, L4, L5,  Medial Branch Level(s)  Safety Precautions: Aspiration looking for blood return was conducted prior to all injections. At no point did we inject any substances, as a needle was being advanced. Before injecting, the patient was told to immediately notify me if  he was experiencing any new onset of "ringing in the ears, or metallic taste in the mouth". No attempts were made at seeking any paresthesias. Safe injection practices and needle disposal techniques used. Medications properly checked for expiration dates. SDV (single dose vial) medications used. After the completion of the procedure, all disposable equipment used  was discarded in the proper designated medical waste containers. Local Anesthesia: Protocol guidelines were followed. The patient was positioned over the fluoroscopy table. The area was prepped in the usual manner. The time-out was completed. The target area was identified using fluoroscopy. A 12-in long, straight, sterile hemostat was used with fluoroscopic guidance to locate the targets for each level blocked. Once located, the skin was marked with an approved surgical skin marker. Once all sites were marked, the skin (epidermis, dermis, and hypodermis), as well as deeper tissues (fat, connective tissue and muscle) were infiltrated with a small amount of a short-acting local anesthetic, loaded on a 10cc syringe with a 25G, 1.5-in  Needle. An appropriate amount of time was allowed for local anesthetics to take effect before proceeding to the next step. Local Anesthetic: Lidocaine 2.0% The unused portion of the local anesthetic was discarded in the proper designated containers. Technical explanation of process:   L3 Medial Branch Nerve Block (MBB): The target area for the L3 medial branch is at the junction of the postero-lateral aspect of the superior articular process and the superior, posterior, and medial edge of the transverse process of L4. Under fluoroscopic guidance, a Quincke needle was inserted until contact was made with os over the superior postero-lateral aspect of the pedicular shadow (target area). After negative aspiration for blood, 25mL of the nerve block solution was injected without difficulty or complication. The needle was removed  intact. L4 Medial Branch Nerve Block (MBB): The target area for the L4 medial branch is at the junction of the postero-lateral aspect of the superior articular process and the superior, posterior, and medial edge of the transverse process of L5. Under fluoroscopic guidance, a Quincke needle was inserted until contact was made with os over the superior postero-lateral aspect of the pedicular shadow (target area). After negative aspiration for blood, 66mL of the nerve block solution was injected without difficulty or complication. The needle was removed intact. L5 Medial Branch Nerve Block (MBB): The target area for the L5 medial branch is at the junction of the postero-lateral aspect of the superior articular process and the superior, posterior, and medial edge of the sacral ala. Under fluoroscopic guidance, a Quincke needle was inserted until contact was made with os over the superior postero-lateral aspect of the pedicular shadow (target area). After negative aspiration for blood, 66mL of the nerve block solution was injected without difficulty or complication. The needle was removed intact.  12 cc solution made of 10 cc of 0.2% ropivacaine, 2 cc of Decadron 10 mg/cc.  2 cc injected at each level above bilaterally.   Once the entire procedure was completed, the treated area was cleaned, making sure to leave some of the prepping solution back to take advantage of its long term bactericidal properties.      Illustration of the posterior view of the lumbar spine and the posterior neural structures. Laminae of L2 through S1 are labeled. DPRL5, dorsal primary ramus of L5; DPRS1, dorsal primary ramus of S1; DPR3, dorsal primary ramus of L3; FJ, facet (zygapophyseal) joint L3-L4; I, inferior articular process of L4; LB1, lateral branch of dorsal primary ramus of L1; IAB, inferior articular branches from L3 medial branch (supplies L4-L5 facet joint); IBP, intermediate branch plexus; MB3, medial branch of dorsal  primary ramus of L3; NR3, third lumbar nerve root; S, superior articular process of L5; SAB, superior articular branches from L4 (supplies L4-5 facet joint also); TP3, transverse process of L3.  Vitals:   11/13/21 0800  11/13/21 0815 11/13/21 0825 11/13/21 0835  BP: (!) 159/74 (!) 162/85 (!) 160/92 (!) 158/90  Pulse: 63 80 75 73  Resp: 18 19 12 13   Temp: (!) 97.3 F (36.3 C)     TempSrc: Temporal     SpO2: 98% 99% 98% 99%  Weight: 150 lb (68 kg)     Height: 5\' 11"  (1.803 m)        Start Time: 0821 hrs. End Time: 0833 hrs.  Imaging Guidance (Spinal):          Type of Imaging Technique: Fluoroscopy Guidance (Spinal) Indication(s): Assistance in needle guidance and placement for procedures requiring needle placement in or near specific anatomical locations not easily accessible without such assistance. Exposure Time: Please see nurses notes. Contrast: None used. Fluoroscopic Guidance: I was personally present during the use of fluoroscopy. "Tunnel Vision Technique" used to obtain the best possible view of the target area. Parallax error corrected before commencing the procedure. "Direction-depth-direction" technique used to introduce the needle under continuous pulsed fluoroscopy. Once target was reached, antero-posterior, oblique, and lateral fluoroscopic projection used confirm needle placement in all planes. Images permanently stored in EMR. Interpretation: No contrast injected. I personally interpreted the imaging intraoperatively. Adequate needle placement confirmed in multiple planes. Permanent images saved into the patient's record.   Post-operative Assessment:  Post-procedure Vital Signs:  Pulse/HCG Rate: 73 (SV)  Temp: (!) 97.3 F (36.3 C) Resp: 13 BP:  (!) 158/90 SpO2: 99 %  EBL: None  Complications: No immediate post-treatment complications observed by team, or reported by patient.  Note: The patient tolerated the entire procedure well. A repeat set of vitals were  taken after the procedure and the patient was kept under observation following institutional policy, for this type of procedure. Post-procedural neurological assessment was performed, showing return to baseline, prior to discharge. The patient was provided with post-procedure discharge instructions, including a section on how to identify potential problems. Should any problems arise concerning this procedure, the patient was given instructions to immediately contact us, at any time, without hesitation. In any case, we plan to contact the patient by telephone for a follow-up status report regarding this interventional procedure.  Comments:  No additional relevant information.  Plan of Care  Orders:  Orders Placed This Encounter  Procedures   DG PAIN [facility] C-ARM 1-60 MIN NO REPORT    Intraoperative interpretation by procedural physician at [place].    Standing Status:   Standing    Number of Occurrences:   1    Order Specific Question:   Reason for exam:    Answer:   Assistance in needle guidance and placement for procedures requiring needle placement in or near specific anatomical locations not easily accessible without such assistance.   Medications ordered for procedure: Meds ordered this encounter  Medications   lidocaine (XYLOCAINE) 2 % (with pres) injection 400 mg   dexamethasone (DECADRON) injection 10 mg   dexamethasone (DECADRON) injection 10 mg   ropivacaine (PF) 2 mg/mL (0.2%) (NAROPIN) injection 9 mL   ropivacaine (PF) 2 mg/mL (0.2%) (NAROPIN) injection 9 mL   Medications administered: We administered lidocaine, dexamethasone, dexamethasone, ropivacaine (PF) 2 mg/mL (0.2%), and ropivacaine (PF) 2 mg/mL (0.2%).  See the medical record for exact dosing, route, and time of administration.  Follow-up plan:   Return in about 2 weeks (around 11/27/2021) for Post Procedure Evaluation, virtual.       L4/5 ESI 09/11/21, B/L L3,4,5 MBNB #1 10/14/21, 11/13/21    Recent  Visits Date Type Provider Dept  11/11/21 Office Visit Gillis Santa, MD [facility]  10/14/21 Procedure visit Gillis Santa, MD [facility]  10/10/21 Office Visit Gillis Santa, MD [facility]  09/11/21 Procedure visit Gillis Santa, MD [facility]  08/22/21 Office Visit Gillis Santa, MD [facility]  Showing recent visits within past 90 days and meeting all other requirements Today's Visits Date Type Provider Dept  11/13/21 Procedure visit Gillis Santa, MD [facility]  Showing today's visits and meeting all other requirements Future Appointments Date Type Provider Dept  11/27/21 Appointment Gillis Santa, MD [facility]  Showing future appointments within next 90 days and meeting all other requirements  Disposition: Discharge home  Discharge (Date   Time): 11/13/2021; 0838 hrs.   Primary Care Physician: Jerrol Banana., MD Location: [facility] Note by: Gillis Santa, MD Date: 11/13/2021; Time: 9:49 AM  Disclaimer:  Medicine is not an exact science. The only guarantee in medicine is that nothing is guaranteed. It is important to note that the decision to proceed with this intervention was based on the information collected from the patient. The Data and conclusions were drawn from the patient's questionnaire, the interview, and the physical examination. Because the information was provided in large part by the patient, it cannot be guaranteed that it has not been purposely or unconsciously manipulated. Every effort has been made to obtain as much relevant data as possible for this evaluation. It is important to note that the conclusions that lead to this procedure are derived in large part from the available data. Always take into account that the treatment will also be dependent on availability of resources and existing treatment guidelines, considered by other Pain Management  Practitioners as being common knowledge and practice, at the time of the intervention. For Medico-Legal purposes, it is also important to point out that variation in procedural techniques and pharmacological choices are the acceptable norm. The indications, contraindications, technique, and results of the above procedure should only be interpreted and judged by a Board-Certified Interventional Pain Specialist with extensive familiarity and expertise in the same exact procedure and technique.

## 2021-11-14 ENCOUNTER — Telehealth: Payer: Self-pay

## 2021-11-14 NOTE — Telephone Encounter (Signed)
Post procedure phone call.  LM 

## 2021-11-21 ENCOUNTER — Telehealth: Payer: Self-pay

## 2021-11-21 NOTE — Progress Notes (Signed)
Chronic Care Management Pharmacy Assistant   Name: Anthony Craig  MRN: [id number] DOB: 12-31-1927  Reason for Encounter:Diabetes Disease State Call.   Recent office visits:  No Recent Office Visit  Recent consult visits:  11/13/2021 Gillis Santa MD (Pain Medicine) Lumbar Facet, Medial Branch Blocks Procedure completed  11/11/2021 Gillis Santa MD (Pain Medicine) No Medication Changes noted  Hospital visits:  None in previous 6 months  Medications: Outpatient Encounter Medications as of 11/21/2021  Medication Sig Note   Alcohol Swabs (B-D SINGLE USE SWABS REGULAR) PADS USE AS DIRECTED  WITH  EACH  FINGERSTICK    aspirin EC 81 MG tablet Take 81 mg by mouth daily.    atorvastatin (LIPITOR) 40 MG tablet TAKE 1 TABLET BY MOUTH EVERYDAY AT BEDTIME (Patient taking differently: 20 mg.)    b complex vitamins tablet Take 1 tablet by mouth daily.    calcium-vitamin D 250-100 MG-UNIT per tablet Take 1 tablet by mouth daily.    cholecalciferol (VITAMIN D) 1000 UNITS tablet Take 1,000 Units by mouth daily.    Coenzyme Q10 (COQ10) 200 MG CAPS Take 100 mg by mouth daily.     diphenhydrAMINE (BENADRYL) 25 MG tablet Take 25 mg by mouth every 6 (six) hours as needed. 11/10/2016: prn   diphenoxylate-atropine (LOMOTIL) 2.5-0.025 MG per tablet Take 1 tablet by mouth 4 (four) times daily as needed for diarrhea or loose stools.    famotidine (PEPCID) 20 MG tablet Take 1 tablet (20 mg total) by mouth 2 (two) times daily.    gabapentin (NEURONTIN) 100 MG capsule Take 1-2 capsules (100-200 mg total) by mouth at bedtime. Follow written titration schedule.    Glucosamine-Chondroitin (OSTEO BI-FLEX REGULAR STRENGTH PO) Take 1 tablet by mouth in the morning and at bedtime.    hydrocortisone 2.5 % lotion Apply 1 application topically 2 (two) times daily. To affected areas of face only as needed for flares of itching/scaling     hydroxypropyl methylcellulose / hypromellose (ISOPTO TEARS / GONIOVISC) 2.5 %  ophthalmic solution Place 1 drop into both eyes 4 (four) times daily as needed for dry eyes.    insulin aspart protamine - aspart (NOVOLOG MIX 70/30 FLEXPEN) (70-30) 100 UNIT/ML FlexPen 12 units in the am and 6 units after dinner.    Krill Oil 500 MG CAPS Take 1 capsule by mouth daily. [place]    lipase/protease/amylase (CREON) 36000 UNITS CPEP capsule Take 36,000 Units by mouth 3 (three) times daily with meals.    Misc Natural Products (OSTEO BI-FLEX ADV JOINT SHIELD PO) Take by mouth 2 (two) times daily.    Multiple Vitamins-Minerals (MULTIVITAMIN PO) Take 1 tablet by mouth daily.    Multiple Vitamins-Minerals (PRESERVISION AREDS PO) Take by mouth.     omega-3 acid ethyl esters (LOVAZA) 1 g capsule Take by mouth 2 (two) times daily.     pantoprazole (PROTONIX) 40 MG tablet TAKE 1 TABLET BY MOUTH EVERY DAY    primidone (MYSOLINE) 50 MG tablet Take 1 tablet (50 mg total) by mouth 4 (four) times daily. (Patient taking differently: Take 50 mg by mouth at bedtime.)    sertraline (ZOLOFT) 25 MG tablet Take 1 tablet (25 mg total) by mouth daily.    ursodiol (ACTIGALL) 300 MG capsule Take 300 mg by mouth 2 (two) times daily.    vitamin B-12 (CYANOCOBALAMIN) 1000 MCG tablet Take 5,000 mcg by mouth daily.     No facility-administered encounter medications on file as of 11/21/2021.    Care Gaps:  FOOT EXAM URINE MICROALBUMIN COVID-19 Vaccine Booster 4 OPHTHALMOLOGY EXAM (last completed 04/24/2020)    Star Rating Drugs: Atorvastatin 40 mg last filled on 10/11/2019 for a 90-Day supply with [facility] (patient also does go to the [place] so it's possible he is getting his prescriptions from there pharmacy)   Recent Relevant Labs: Lab Results  Component Value Date/Time   HGBA1C 8.0 08/26/2021 12:00 AM   HGBA1C 8.2 (A) 12/10/2020 09:41 AM   HGBA1C 8.4 (H) 07/16/2020 09:15 AM   HGBA1C 7.6 (A) 03/13/2020 02:11 PM   HGBA1C 7.6 (H) 11/23/2019 03:52 PM   HGBA1C 7.4 03/09/2017 12:00 AM    Kidney  Function Lab Results  Component Value Date/Time   CREATININE 1.0 08/26/2021 12:00 AM   CREATININE 0.83 07/28/2021 04:13 PM   CREATININE 0.96 07/16/2020 09:15 AM   CREATININE 1.10 01/17/2014 04:13 AM   CREATININE 1.04 01/16/2014 02:01 AM   GFRNONAA 71 08/26/2021 12:00 AM   GFRNONAA >60 07/28/2021 04:13 PM   GFRNONAA >60 01/17/2014 04:13 AM   GFRAA 80 07/16/2020 09:15 AM   GFRAA >60 01/17/2014 04:13 AM    Current antihyperglycemic regimen:  Novolog 70/30 12 units AM, 6 units PM, will give extra 4 units if sugars are elevated.   What recent interventions/DTPs have been made to improve glycemic control:  None ID  Have there been any recent hospitalizations or ED visits since last visit with CPP? No  Patient reports hypoglycemic symptoms, including Pale, Sweaty, Shaky, and fatigue and weakness. Patient states a couple days ago his blood sugar drop around 70 because he had a light lunch.Patient states once he drunk some apple juice and ate crackers his blood sugar and symptoms improved.   Patient denies hyperglycemic symptoms, including blurry vision, excessive thirst, fatigue, polyuria, and weakness  How often are you checking your blood sugar?  Use Dexcom  What are your blood sugars ranging?  Patient states his blood sugar ranges around mid's 100's - 220.  Patient states his blood sugar will be high ranging around 300's, but if he drinks water his blood sugar seems to be do better. Patient reports his Endocrinologist inform him the reason why he would have elevated blood sugar after he exercise.Patient states per endocrinologist he would burn all his carbohydrates during exercising , and the body would send a signal causing the pancreas needing more carbohydrates that would increase his blood sugar.  During the week, how often does your blood glucose drop below 70? Once a week Patient states a couple days ago his blood sugar drop around 70 because he had a light lunch.Patient states  once he had some apple juice and ate some crackers his blood sugar and symptoms improved.   Are you checking your feet daily/regularly?   Yes, patient denies numbness,pain, or tingling sensations in his feet.  Adherence Review: Is the patient currently on a STATIN medication? Yes Is the patient currently on ACE/ARB medication? No Does the patient have >5 day gap between last estimated fill dates? Yes   Office follow up appointment with Care management team member scheduled for : 04/29/2022 at 3:45 pm.  [place] Pharmacist Assistant [phone]

## 2021-11-26 NOTE — Telephone Encounter (Signed)
Dr Holley Raring, could you call this patient early tomorrow around 1pm.  His primary care booked a phone visit at 320.  He has a new primary number [phone].  I have sent the front desk a message to change this in the system.  Thanks.

## 2021-11-26 NOTE — Telephone Encounter (Signed)
The patient no longer has a home phone.  Please change the primary number to  his cell phone [phone].

## 2021-11-27 ENCOUNTER — Encounter: Payer: Self-pay | Admitting: Student in an Organized Health Care Education/Training Program

## 2021-11-27 ENCOUNTER — Other Ambulatory Visit: Payer: Self-pay

## 2021-11-27 ENCOUNTER — Ambulatory Visit
Payer: No Typology Code available for payment source | Attending: Student in an Organized Health Care Education/Training Program | Admitting: Student in an Organized Health Care Education/Training Program

## 2021-11-27 DIAGNOSIS — M5136 Other intervertebral disc degeneration, lumbar region: Secondary | ICD-10-CM

## 2021-11-27 DIAGNOSIS — M47816 Spondylosis without myelopathy or radiculopathy, lumbar region: Secondary | ICD-10-CM | POA: Diagnosis not present

## 2021-11-27 DIAGNOSIS — G8929 Other chronic pain: Secondary | ICD-10-CM

## 2021-11-27 DIAGNOSIS — G894 Chronic pain syndrome: Secondary | ICD-10-CM | POA: Diagnosis not present

## 2021-11-27 DIAGNOSIS — M5416 Radiculopathy, lumbar region: Secondary | ICD-10-CM | POA: Diagnosis not present

## 2021-11-27 DIAGNOSIS — M48062 Spinal stenosis, lumbar region with neurogenic claudication: Secondary | ICD-10-CM

## 2021-11-27 NOTE — Progress Notes (Signed)
Patient: Anthony Craig  Service Category: E/M  Provider: Gillis Santa, MD  DOB: 08/07/1928  DOS: 11/27/2021  Location: Office  MRN: [id number]  Setting: Ambulatory outpatient  Referring Provider: Jerrol Banana.,*  Type: Established Patient  Specialty: Interventional Pain Management  PCP: Jerrol Banana., MD  Location: Remote location  Delivery: TeleHealth     Virtual Encounter - Pain Management PROVIDER NOTE: Information contained herein reflects review and annotations entered in association with encounter. Interpretation of such information and data should be left to medically-trained personnel. Information provided to patient can be located elsewhere in the medical record under "Patient Instructions". Document created using STT-dictation technology, any transcriptional errors that may result from process are unintentional.    Contact & Pharmacy Preferred: [phone] Home: [phone] (home) Mobile: [phone] (mobile) E-mail: No e-mail address on record  [place], [place] [address] Phone: [phone] Fax: [place], [place] [address] [place] [address] Phone: [phone] Fax: [phone]   Pre-screening  Mr. Bralley offered "in-person" vs "virtual" encounter. He indicated preferring virtual for this encounter.   Reason COVID-19*   Social distancing based on CDC and AMA recommendations.   I contacted Anthony Craig on 11/27/2021 via telephone.      I clearly identified myself as Gillis Santa, MD. I verified that I was speaking with the correct person using two identifiers (Name: Pernell Lenoir, and date of birth: 1928/08/22).  Consent I sought verbal advanced consent from Anthony Craig for virtual visit interactions. I informed Mr. Castoro of possible security and privacy concerns, risks, and limitations associated with  providing "not-in-person" medical evaluation and management services. I also informed Mr. Bouchie of the availability of "in-person" appointments. Finally, I informed him that there would be a charge for the virtual visit and that he could be  personally, fully or partially, financially responsible for it. Mr. Buttery expressed understanding and agreed to proceed.   Historic Elements   Mr. Jorge Retz is a 88 y.o. year old, male patient evaluated today after our last contact on 11/13/2021. Mr. Spinello  has a past medical history of Cancer [facility]), Coronary artery disease, Diabetes mellitus without complication ([place]), Hyperlipidemia, and Hypertension. He also  has a past surgical history that includes Hernia repair (Bilateral); Whipple procedure (2007); Coronary artery bypass graft; Appendectomy; and Biliary drainage. Mr. Gittleman has a current medication list which includes the following prescription(s): b-d single use swabs regular, aspirin ec, atorvastatin, b complex vitamins, calcium-vitamin d, cholecalciferol, coq10, diphenhydramine, diphenoxylate-atropine, famotidine, gabapentin, glucosamine-chondroitin, hydrocortisone, hydroxypropyl methylcellulose / hypromellose, novolog mix 70/30 flexpen, krill oil, lipase/protease/amylase, misc natural products, multiple vitamin, multiple vitamins-minerals, omega-3 acid ethyl esters, pantoprazole, primidone, sertraline, ursodiol, and vitamin b-12. He  reports that he quit smoking about 42 years ago. His smoking use included cigarettes. He has a 15.00 pack-year smoking history. He has never used smokeless tobacco. He reports current alcohol use. He reports that he does not use drugs. Mr. Scharnhorst is allergic to doxycycline and hydrocodone bit-homatrop mbr.   HPI  Today, he is being contacted for a post-procedure assessment.   Post-procedure evaluation    Type: Lumbar Facet, Medial Branch Block(s) #2  Laterality: Bilateral Level: L3, L4, L5, Medial Branch  Level(s). Injecting these levels blocks the L3-4, L4-5, lumbar facet joints. Imaging: Fluoroscopic guidance Anesthesia: Local anesthesia (1-2% Lidocaine) Anxiolysis: None  Sedation: None.  DOS: 11/13/2021 Performed by: Gillis Santa, MD  Primary Purpose: Diagnostic/Therapeutic Indications: Low back pain severe enough to impact quality of life or function. 1. Lumbar spondylosis   2. Chronic pain syndrome    NAS-11 Pain score:   Pre-procedure: 2 /10   Post-procedure: 0-No pain/10      Effectiveness:  Initial hour after procedure: 100 %  Subsequent 4-6 hours post-procedure: 100 %  Analgesia past initial 6 hours: 100% for  1 week then gradually reduced to 35 % (ongoing)  Ongoing improvement:  Analgesic:  35% Function: Mr. Doering reports improvement in function ROM: Mr. Pizzi reports improvement in ROM  Status post 2 positive diagnostic lumbar facet medial branch nerve blocks.  Recommend lumbar radiofrequency ablation to the medial branch nerves for the purpose of obtaining longer-term pain relief.  Risk and benefits reviewed and patient would like to proceed with ablation.  He has had friends and family members who have had lumbar radiofrequency ablation with positive results he is looking forward to that.  Laboratory Chemistry Profile   Renal Lab Results  Component Value Date   BUN 22 (A) 08/26/2021   CREATININE 1.0 08/26/2021   BCR 14 07/16/2020   GFRAA 80 07/16/2020   GFRNONAA 71 08/26/2021    Hepatic Lab Results  Component Value Date   AST 21 08/26/2021   ALT 29 08/26/2021   ALBUMIN 3.8 08/26/2021   ALKPHOS 123 08/26/2021   AMYLASE 50 09/10/2016   LIPASE 7 (L) 11/23/2019    Electrolytes Lab Results  Component Value Date   NA 135 07/28/2021   K 4.9 08/26/2021   CL 105 08/26/2021   CALCIUM 9.4 08/26/2021   MG 1.5 (L) 01/17/2014    Bone Lab Results  Component Value Date   VD25OH 37.9 08/26/2021    Inflammation (CRP: Acute Phase) (ESR: Chronic  Phase) No results found for: CRP, ESRSEDRATE, LATICACIDVEN       Note: Above Lab results reviewed.  Assessment  The primary encounter diagnosis was Lumbar spondylosis. Diagnoses of Lumbar degenerative disc disease, Lumbar radiculopathy, Chronic radicular lumbar pain, Spinal stenosis, lumbar region, with neurogenic claudication, and Chronic pain syndrome were also pertinent to this visit.  Plan of Care   Mr. Tajon Moring has a current medication list which includes the following long-term medication(s): atorvastatin, calcium-vitamin d, famotidine, gabapentin, novolog mix 70/30 flexpen, omega-3 acid ethyl esters, pantoprazole, primidone, and sertraline.   Orders:  Orders Placed This Encounter  Procedures   Radiofrequency,Lumbar    Standing Status:   Future    Standing Expiration Date:   05/27/2022    Scheduling Instructions:     Side(s): right     Level(s): L3, L4, L5, Medial Branch Nerve(s)     Sedation: Without     Scheduling Timeframe: As soon as pre-approved    Order Specific Question:   Where will this procedure be performed?    Answer:   [facility]   Radiofrequency,Lumbar    Standing Status:   Future    Standing Expiration Date:   05/27/2022    Scheduling Instructions:     Left L3,4,5, RFA without sedation     2 weeks after right    Order Specific Question:   Where will this procedure be performed?    Answer:   [facility]   Follow-up plan:   Return in about 2 weeks (around 12/11/2021) for R L3,4,5 RFA withou sedation.     L4/5 ESI 09/11/21, B/L L3,4,5 MBNB #1  10/14/21 (80% pain relief for the first 2 weeks), 11/13/21 (100% pain relief for the first week)   Recent Visits Date Type Provider Dept  11/13/21 Procedure visit Gillis Santa, MD [facility]  11/11/21 Office Visit Gillis Santa, MD [facility]  10/14/21 Procedure visit Gillis Santa, MD [facility]  10/10/21 Office Visit Gillis Santa, MD [facility]   09/11/21 Procedure visit Gillis Santa, MD [facility]  Showing recent visits within past 90 days and meeting all other requirements Today's Visits Date Type Provider Dept  11/27/21 Office Visit Gillis Santa, MD [facility]  Showing today's visits and meeting all other requirements Future Appointments No visits were found meeting these conditions. Showing future appointments within next 90 days and meeting all other requirements  I discussed the assessment and treatment plan with the patient. The patient was provided an opportunity to ask questions and all were answered. The patient agreed with the plan and demonstrated an understanding of the instructions.  Patient advised to call back or seek an in-person evaluation if the symptoms or condition worsens.  Duration of encounter: 30 minutes.  Note by: Gillis Santa, MD Date: 11/27/2021; Time: 1:56 PM

## 2021-11-27 NOTE — Patient Instructions (Signed)

## 2021-12-04 ENCOUNTER — Ambulatory Visit (HOSPITAL_BASED_OUTPATIENT_CLINIC_OR_DEPARTMENT_OTHER)
Payer: No Typology Code available for payment source | Admitting: Student in an Organized Health Care Education/Training Program

## 2021-12-04 ENCOUNTER — Ambulatory Visit
Admission: RE | Admit: 2021-12-04 | Discharge: 2021-12-04 | Disposition: A | Discharge status: Home / Self Care | Payer: No Typology Code available for payment source | Source: Ambulatory Visit | Attending: Student in an Organized Health Care Education/Training Program | Admitting: Student in an Organized Health Care Education/Training Program

## 2021-12-04 ENCOUNTER — Encounter: Payer: Self-pay | Admitting: Student in an Organized Health Care Education/Training Program

## 2021-12-04 ENCOUNTER — Other Ambulatory Visit: Payer: Self-pay

## 2021-12-04 DIAGNOSIS — M51369 Other intervertebral disc degeneration, lumbar region without mention of lumbar back pain or lower extremity pain: Secondary | ICD-10-CM

## 2021-12-04 DIAGNOSIS — M47816 Spondylosis without myelopathy or radiculopathy, lumbar region: Secondary | ICD-10-CM

## 2021-12-04 DIAGNOSIS — G894 Chronic pain syndrome: Secondary | ICD-10-CM

## 2021-12-04 DIAGNOSIS — M5136 Other intervertebral disc degeneration, lumbar region: Secondary | ICD-10-CM | POA: Diagnosis not present

## 2021-12-04 DIAGNOSIS — M48062 Spinal stenosis, lumbar region with neurogenic claudication: Secondary | ICD-10-CM | POA: Insufficient documentation

## 2021-12-04 MED ORDER — ROPIVACAINE HCL 2 MG/ML IJ SOLN
9.0000 mL | Freq: Once | INTRAMUSCULAR | Status: AC
Start: 2021-12-04 — End: 2021-12-04
  Administered 2021-12-04: 9 mL via PERINEURAL

## 2021-12-04 MED ORDER — DEXAMETHASONE SODIUM PHOSPHATE 10 MG/ML IJ SOLN
INTRAMUSCULAR | Status: AC
  Filled 2021-12-04: qty 1

## 2021-12-04 MED ORDER — LIDOCAINE HCL 2 % IJ SOLN
20.0000 mL | Freq: Once | INTRAMUSCULAR | Status: AC
  Administered 2021-12-04: 400 mg

## 2021-12-04 MED ORDER — ROPIVACAINE HCL 2 MG/ML IJ SOLN
INTRAMUSCULAR | Status: AC
Start: 2021-12-04 — End: ?
  Filled 2021-12-04: qty 20

## 2021-12-04 MED ORDER — LIDOCAINE HCL 2 % IJ SOLN
INTRAMUSCULAR | Status: AC
Start: 2021-12-04 — End: ?
  Filled 2021-12-04: qty 20

## 2021-12-04 MED ORDER — DEXAMETHASONE SODIUM PHOSPHATE 10 MG/ML IJ SOLN
10.0000 mg | Freq: Once | INTRAMUSCULAR | Status: AC
  Administered 2021-12-04: 10 mg

## 2021-12-04 NOTE — Patient Instructions (Signed)

## 2021-12-04 NOTE — Progress Notes (Signed)
PROVIDER NOTE: Interpretation of information contained herein should be left to medically-trained personnel. Specific patient instructions are provided elsewhere under "Patient Instructions" section of medical record. This document was created in part using STT-dictation technology, any transcriptional errors that may result from this process are unintentional.  Patient: Anthony Craig Type: Established DOB: [birth year] MRN: [id number] PCP: Jerrol Banana., MD  Service: Procedure DOS: 12/04/2021 Setting: Ambulatory Location: Ambulatory outpatient facility Delivery: Face-to-face Provider: Gillis Santa, MD Specialty: Interventional Pain Management Specialty designation: 09 Location: Outpatient facility Ref. Prov.: Gillis Santa, MD    Primary Reason for Visit: Interventional Pain Management Treatment. CC: Back Pain (Right, lower)  Procedure:           Type: Lumbar Facet, Medial Branch Radiofrequency Ablation (RFA) #1  Laterality: Right  Level: L3, L4, L5, Medial Branch Level(s). These levels will denervate the L3-4 and L5-S1 lumbar facet joints.  Imaging: Fluoroscopic guidance Anesthesia: Local anesthesia (1-2% Lidocaine) Anxiolysis: None                 Sedation: None. DOS: 12/04/2021  Performed by: Gillis Santa, MD  Purpose: Therapeutic/Palliative Indications: Low back pain severe enough to impact quality of life or function. Indications: 1. Lumbar spondylosis   2. Lumbar degenerative disc disease   3. Spinal stenosis, lumbar region, with neurogenic claudication   4. Chronic pain syndrome    Anthony Craig has been dealing with the above chronic pain for longer than three months and has either failed to respond, was unable to tolerate, or simply did not get enough benefit from other more conservative therapies including, but not limited to: 1. Over-the-counter medications 2. Anti-inflammatory medications 3. Muscle relaxants 4. Membrane stabilizers 5. Opioids 6.  Physical therapy and/or chiropractic manipulation 7. Modalities (Heat, ice, etc.) 8. Invasive techniques such as nerve blocks. [name] has attained more than 50% relief of the pain from a series of diagnostic injections conducted in separate occasions.  Pain Score: Pre-procedure: 2 /10 Post-procedure: 0-No pain (standing,walking)/10     Position / Prep / Materials:  Position: Prone  Prep solution: DuraPrep (Iodine Povacrylex [0.7% available iodine] and Isopropyl Alcohol, 74% w/w) Prep Area: Entire Lumbosacral Region (Lower back from mid-thoracic region to end of tailbone and from flank to flank.) Materials:  Tray: RFA (Radiofrequency) tray Needle(s):  Type: RFA (Teflon-coated radiofrequency ablation needles) Gauge (G): 22  Length: Regular (10cm) Qty: 3  Pre-op H&P Assessment:  Anthony Craig is a 88 y.o. (year old), male patient, seen today for interventional treatment. He  has a past surgical history that includes Hernia repair (Bilateral); Whipple procedure (2007); Coronary artery bypass graft; Appendectomy; and Biliary drainage. Anthony Craig has a current medication list which includes the following prescription(s): b-d single use swabs regular, aspirin ec, atorvastatin, b complex vitamins, calcium-vitamin d, cholecalciferol, coq10, diphenhydramine, diphenoxylate-atropine, famotidine, glucosamine-chondroitin, hydrocortisone, hydroxypropyl methylcellulose / hypromellose, novolog mix 70/30 flexpen, krill oil, lipase/protease/amylase, misc natural products, multiple vitamin, multiple vitamins-minerals, omega-3 acid ethyl esters, pantoprazole, primidone, sertraline, ursodiol, vitamin b-12, and gabapentin. His primarily concern today is the Back Pain (Right, lower)  Initial Vital Signs:  Pulse/HCG Rate: 82  Temp:  (!) 97.2 F (36.2 C) Resp: 16 BP: 125/65 SpO2: 96 %  BMI: Estimated body mass index is 20.92 kg/m as calculated from the following:   Height as of this encounter: 5'  11" (1.803 m).   Weight as of this encounter: 150 lb (68 kg).  Risk Assessment: Allergies: Reviewed. He is allergic to doxycycline and hydrocodone bit-homatrop mbr.  Allergy Precautions: None required Coagulopathies: Reviewed. None identified.  Blood-thinner therapy: None at this time Active Infection(s): Reviewed. None identified. Anthony Craig is afebrile  Site Confirmation: Anthony Craig was asked to confirm the procedure and laterality before marking the site Procedure checklist: Completed Consent: Before the procedure and under the influence of no sedative(s), amnesic(s), or anxiolytics, the patient was informed of the treatment options, risks and possible complications. To fulfill our ethical and legal obligations, as recommended by the American Medical Association's Code of Ethics, I have informed the patient of my clinical impression; the nature and purpose of the treatment or procedure; the risks, benefits, and possible complications of the intervention; the alternatives, including doing nothing; the risk(s) and benefit(s) of the alternative treatment(s) or procedure(s); and the risk(s) and benefit(s) of doing nothing. The patient was provided information about the general risks and possible complications associated with the procedure. These may include, but are not limited to: failure to achieve desired goals, infection, bleeding, organ or nerve damage, allergic reactions, paralysis, and death. In addition, the patient was informed of those risks and complications associated to Spine-related procedures, such as failure to decrease pain; infection (i.e.: Meningitis, epidural or intraspinal abscess); bleeding (i.e.: epidural hematoma, subarachnoid hemorrhage, or any other type of intraspinal or peri-dural bleeding); organ or nerve damage (i.e.: Any type of peripheral nerve, nerve root, or spinal cord injury) with subsequent damage to sensory, motor, and/or autonomic systems, resulting in  permanent pain, numbness, and/or weakness of one or several areas of the body; allergic reactions; (i.e.: anaphylactic reaction); and/or death. Furthermore, the patient was informed of those risks and complications associated with the medications. These include, but are not limited to: allergic reactions (i.e.: anaphylactic or anaphylactoid reaction(s)); adrenal axis suppression; blood sugar elevation that in diabetics may result in ketoacidosis or comma; water retention that in patients with history of congestive heart failure may result in shortness of breath, pulmonary edema, and decompensation with resultant heart failure; weight gain; swelling or edema; medication-induced neural toxicity; particulate matter embolism and blood vessel occlusion with resultant organ, and/or nervous system infarction; and/or aseptic necrosis of one or more joints. Finally, the patient was informed that Medicine is not an exact science; therefore, there is also the possibility of unforeseen or unpredictable risks and/or possible complications that may result in a catastrophic outcome. The patient indicated having understood very clearly. We have given the patient no guarantees and we have made no promises. Enough time was given to the patient to ask questions, all of which were answered to the patient's satisfaction. Mr. Forst has indicated that he wanted to continue with the procedure. Attestation: I, the ordering provider, attest that I have discussed with the patient the benefits, risks, side-effects, alternatives, likelihood of achieving goals, and potential problems during recovery for the procedure that I have provided informed consent. Date   Time: 12/04/2021  7:45 AM  Pre-Procedure Preparation:  Monitoring: As per clinic protocol. Respiration, ETCO2, SpO2, BP, heart rate and rhythm monitor placed and checked for adequate function Safety Precautions: Patient was assessed for positional comfort and pressure points  before starting the procedure. Time-out: I initiated and conducted the "Time-out" before starting the procedure, as per protocol. The patient was asked to participate by confirming the accuracy of the "Time Out" information. Verification of the correct person, site, and procedure were performed and confirmed by me, the nursing staff, and the patient. "Time-out" conducted as per Joint Commission's Universal Protocol (UP.01.01.01). Time: 0826  Description of Procedure:  Laterality: Right Levels:  L3, L4, L5, Medial Branch Level(s), at the L3-4 and L5-S1 lumbar facet joints. Safety Precautions: Aspiration looking for blood return was conducted prior to all injections. At no point did we inject any substances, as a needle was being advanced. Before injecting, the patient was told to immediately notify me if he was experiencing any new onset of "ringing in the ears, or metallic taste in the mouth". No attempts were made at seeking any paresthesias. Safe injection practices and needle disposal techniques used. Medications properly checked for expiration dates. SDV (single dose vial) medications used. After the completion of the procedure, all disposable equipment used was discarded in the proper designated medical waste containers. Local Anesthesia: Protocol guidelines were followed. The patient was positioned over the fluoroscopy table. The area was prepped in the usual manner. The time-out was completed. The target area was identified using fluoroscopy. A 12-in long, straight, sterile hemostat was used with fluoroscopic guidance to locate the targets for each level blocked. Once located, the skin was marked with an approved surgical skin marker. Once all sites were marked, the skin (epidermis, dermis, and hypodermis), as well as deeper tissues (fat, connective tissue and muscle) were infiltrated with a small amount of a short-acting local anesthetic, loaded on a 10cc syringe with a 25G, 1.5-in  Needle.  An appropriate amount of time was allowed for local anesthetics to take effect before proceeding to the next step. Technical description of process:   Radiofrequency Ablation (RFA) L3 Medial Branch Nerve RFA: The target area for the L3 medial branch is at the junction of the postero-lateral aspect of the superior articular process and the superior, posterior, and medial edge of the transverse process of L4. Under fluoroscopic guidance, a Radiofrequency needle was inserted until contact was made with os over the superior postero-lateral aspect of the pedicular shadow (target area). Sensory and motor testing was conducted to properly adjust the position of the needle. Once satisfactory placement of the needle was achieved, the numbing solution was slowly injected after negative aspiration for blood. 2.0 mL of the nerve block solution was injected without difficulty or complication. After waiting for at least 3 minutes, the ablation was performed. Once completed, the needle was removed intact. L4 Medial Branch Nerve RFA: The target area for the L4 medial branch is at the junction of the postero-lateral aspect of the superior articular process and the superior, posterior, and medial edge of the transverse process of L5. Under fluoroscopic guidance, a Radiofrequency needle was inserted until contact was made with os over the superior postero-lateral aspect of the pedicular shadow (target area). Sensory and motor testing was conducted to properly adjust the position of the needle. Once satisfactory placement of the needle was achieved, the numbing solution was slowly injected after negative aspiration for blood. 2.0 mL of the nerve block solution was injected without difficulty or complication. After waiting for at least 3 minutes, the ablation was performed. Once completed, the needle was removed intact. L5 Medial Branch Nerve RFA: The target area for the L5 medial branch is at the junction of the postero-lateral  aspect of the superior articular process of S1 and the superior, posterior, and medial edge of the sacral ala. Under fluoroscopic guidance, a Radiofrequency needle was inserted until contact was made with os over the superior postero-lateral aspect of the pedicular shadow (target area). Sensory and motor testing was conducted to properly adjust the position of the needle. Once satisfactory placement of the needle was achieved, the  numbing solution was slowly injected after negative aspiration for blood. 2.0 mL of the nerve block solution was injected without difficulty or complication. After waiting for at least 3 minutes, the ablation was performed. Once completed, the needle was removed intact. Radiofrequency Generator: Medtronic AccurianTM AG 1000 RF Generator Sensory Stimulation Parameters: 50 Hz was used to locate & identify the nerve, making sure that the needle was positioned such that there was no sensory stimulation below 0.3 V or above 0.7 V. Motor Stimulation Parameters: 2 Hz was used to evaluate the motor component. Care was taken not to lesion any nerves that demonstrated motor stimulation of the lower extremities at an output of less than 2.5 times that of the sensory threshold, or a maximum of 2.0 V. Lesioning Technique Parameters: Standard Radiofrequency settings. (Not bipolar or pulsed.) Temperature Settings: 80 degrees C Lesioning time: 60 seconds Intra-operative Compliance: Compliant  6 cc solution made of 5 cc of 0.2% ropivacaine, 1 cc of Decadron 10 mg/cc.  2 cc injected at each level above after sensorimotor testing, prior to lesioning.  Once the entire procedure was completed, the treated area was cleaned, making sure to leave some of the prepping solution back to take advantage of its long term bactericidal properties.    Illustration of the posterior view of the lumbar spine and the posterior neural structures. Laminae of L2 through S1 are labeled. DPRL5, dorsal primary ramus of  L5; DPRS1, dorsal primary ramus of S1; DPR3, dorsal primary ramus of L3; FJ, facet (zygapophyseal) joint L3-L4; I, inferior articular process of L4; LB1, lateral branch of dorsal primary ramus of L1; IAB, inferior articular branches from L3 medial branch (supplies L4-L5 facet joint); IBP, intermediate branch plexus; MB3, medial branch of dorsal primary ramus of L3; NR3, third lumbar nerve root; S, superior articular process of L5; SAB, superior articular branches from L4 (supplies L4-5 facet joint also); TP3, transverse process of L3.  Vitals:   12/04/21 0838 12/04/21 0843 12/04/21 0848 12/04/21 0858  BP: (!) 147/99 (!) 147/89 (!) 151/90 (!) 150/91  Pulse: 84 82 86 85  Resp: 16 16 18 16   Temp:      TempSrc:      SpO2: 98% 97% 98% 98%  Weight:      Height:       Start Time: 0826 hrs. End Time: 0848 hrs.  Imaging Guidance (Spinal):          Type of Imaging Technique: Fluoroscopy Guidance (Spinal) Indication(s): Assistance in needle guidance and placement for procedures requiring needle placement in or near specific anatomical locations not easily accessible without such assistance. Exposure Time: Please see nurses notes. Contrast: None used. Fluoroscopic Guidance: I was personally present during the use of fluoroscopy. "Tunnel Vision Technique" used to obtain the best possible view of the target area. Parallax error corrected before commencing the procedure. "Direction-depth-direction" technique used to introduce the needle under continuous pulsed fluoroscopy. Once target was reached, antero-posterior, oblique, and lateral fluoroscopic projection used confirm needle placement in all planes. Images permanently stored in EMR. Interpretation: No contrast injected. I personally interpreted the imaging intraoperatively. Adequate needle placement confirmed in multiple planes. Permanent images saved into the patient's record.  Post-operative Assessment:  Post-procedure Vital Signs:  Pulse/HCG Rate:  85  Temp:  (!) 97.2 F (36.2 C) Resp: 16 BP: (!) 150/91 SpO2: 98 %  EBL: None  Complications: No immediate post-treatment complications observed by team, or reported by patient.  Note: The patient tolerated the entire procedure well. A repeat  set of vitals were taken after the procedure and the patient was kept under observation following institutional policy, for this type of procedure. Post-procedural neurological assessment was performed, showing return to baseline, prior to discharge. The patient was provided with post-procedure discharge instructions, including a section on how to identify potential problems. Should any problems arise concerning this procedure, the patient was given instructions to immediately contact us, at any time, without hesitation. In any case, we plan to contact the patient by telephone for a follow-up status report regarding this interventional procedure.  Comments:  No additional relevant information.  Plan of Care  Orders:  Orders Placed This Encounter  Procedures   DG PAIN CLIN[facility] C-ARM 1-60 MIN NO REPORT    Intraoperative interpretation by procedural physician at [facility].    Standing Status:   Standing    Number of Occurrences:   1    Order Specific Question:   Reason for exam:    Answer:   Assistance in needle guidance and placement for procedures requiring needle placement in or near specific anatomical locations not easily accessible without such assistance.     Medications ordered for procedure: Meds ordered this encounter  Medications   lidocaine (XYLOCAINE) 2 % (with pres) injection 400 mg   dexamethasone (DECADRON) injection 10 mg   ropivacaine (PF) 2 mg/mL (0.2%) (NAROPIN) injection 9 mL   Medications administered: We administered lidocaine, dexamethasone, and ropivacaine (PF) 2 mg/mL (0.2%).  See the medical record for exact dosing, route, and time of administration.  Follow-up plan:   Return in about 2 weeks (around  12/18/2021) for Left L3, 4, 5 RFA, in clinic NS.       L4/5 ESI 09/11/21, B/L L3,4,5 MBNB #1 10/14/21 (80% pain relief for the first 2 weeks), 11/13/21 (100% pain relief for the first week), R L3,4,5 RFA 12/04/21    Recent Visits Date Type Provider Dept  11/27/21 Office Visit Gillis Santa, MD [facility]  11/13/21 Procedure visit Gillis Santa, MD [facility]  11/11/21 Office Visit Gillis Santa, MD [facility]  10/14/21 Procedure visit Gillis Santa, MD [facility]  10/10/21 Office Visit Gillis Santa, MD [facility]  09/11/21 Procedure visit Gillis Santa, MD [facility]  Showing recent visits within past 90 days and meeting all other requirements Today's Visits Date Type Provider Dept  12/04/21 Procedure visit Gillis Santa, MD [facility]  Showing today's visits and meeting all other requirements Future Appointments Date Type Provider Dept  12/18/21 Appointment Gillis Santa, MD [facility]  Showing future appointments within next 90 days and meeting all other requirements  Disposition: Discharge home  Discharge (Date   Time): 12/04/2021; 0856 hrs.   Primary Care Physician: Jerrol Banana., MD Location: [facility] Note by: Gillis Santa, MD Date: 12/04/2021; Time: 9:20 AM  Disclaimer:  Medicine is not an exact science. The only guarantee in medicine is that nothing is guaranteed. It is important to note that the decision to proceed with this intervention was based on the information collected from the patient. The Data and conclusions were drawn from the patient's questionnaire, the interview, and the physical examination. Because the information was provided in large part by the patient, it cannot be guaranteed that it has not been purposely or unconsciously manipulated. Every effort has been made to obtain as much relevant data as possible for this evaluation. It is important  to note that the conclusions that lead  to this procedure are derived in large part from the available data. Always take into account that the treatment will also be dependent on availability of resources and existing treatment guidelines, considered by other Pain Management Practitioners as being common knowledge and practice, at the time of the intervention. For Medico-Legal purposes, it is also important to point out that variation in procedural techniques and pharmacological choices are the acceptable norm. The indications, contraindications, technique, and results of the above procedure should only be interpreted and judged by a Board-Certified Interventional Pain Specialist with extensive familiarity and expertise in the same exact procedure and technique.

## 2021-12-05 ENCOUNTER — Telehealth: Payer: Self-pay | Admitting: *Deleted

## 2021-12-05 NOTE — Telephone Encounter (Signed)
No problems post procedure. 

## 2021-12-11 ENCOUNTER — Ambulatory Visit (INDEPENDENT_AMBULATORY_CARE_PROVIDER_SITE_OTHER): Payer: Medicare PPO

## 2021-12-11 DIAGNOSIS — Z0000 Encounter for general adult medical examination without abnormal findings: Secondary | ICD-10-CM

## 2021-12-11 NOTE — Progress Notes (Signed)
Virtual Visit via Telephone Note  I connected with  Anthony Craig on 12/11/21 at  3:00 PM EST by telephone and verified that I am speaking with the correct person using two identifiers.  Location: Patient: home  Provider: [facility] Persons participating in the virtual visit: [place]   I discussed the limitations, risks, security and privacy concerns of performing an evaluation and management service by telephone and the availability of in person appointments. The patient expressed understanding and agreed to proceed.  Interactive audio and video telecommunications were attempted between this nurse and patient, however failed, due to patient having technical difficulties OR patient did not have access to video capability.  We continued and completed visit with audio only.  Some vital signs may be absent or patient reported.   Dionisio David, LPN  Subjective:   Anthony Craig is a 88 y.o. male who presents for Medicare Annual/Subsequent preventive examination.  Review of Systems           Objective:    Today's Vitals   12/11/21 1456  PainSc: 0-No pain   There is no height or weight on file to calculate BMI.  Advanced Directives 12/04/2021 11/13/2021 10/14/2021 08/22/2021 07/28/2021 06/13/2020 10/17/2019  Does Patient Have a Medical Advance Directive? Yes Yes Yes Yes No Yes No  Type of Advance Directive - [place];Living will [place];Living will [place];Living will - [place];Living will -  Does patient want to make changes to medical advance directive? - - - - - - -  Copy of [place] in Chart? - No - copy requested - No - copy requested - No - copy requested -  Would patient like information on creating a medical advance directive? - - - - - - -    Current Medications (verified) Outpatient Encounter Medications as of 12/11/2021  Medication Sig   Alcohol  Swabs (B-D SINGLE USE SWABS REGULAR) PADS USE AS DIRECTED  WITH  EACH  FINGERSTICK   aspirin EC 81 MG tablet Take 81 mg by mouth daily.   atorvastatin (LIPITOR) 40 MG tablet TAKE 1 TABLET BY MOUTH EVERYDAY AT BEDTIME (Patient taking differently: 20 mg.)   b complex vitamins tablet Take 1 tablet by mouth daily.   calcium-vitamin D 250-100 MG-UNIT per tablet Take 1 tablet by mouth daily.   cholecalciferol (VITAMIN D) 1000 UNITS tablet Take 1,000 Units by mouth daily.   Coenzyme Q10 (COQ10) 200 MG CAPS Take 100 mg by mouth daily.    diphenhydrAMINE (BENADRYL) 25 MG tablet Take 25 mg by mouth every 6 (six) hours as needed.   diphenoxylate-atropine (LOMOTIL) 2.5-0.025 MG per tablet Take 1 tablet by mouth 4 (four) times daily as needed for diarrhea or loose stools.   famotidine (PEPCID) 20 MG tablet Take 1 tablet (20 mg total) by mouth 2 (two) times daily.   gabapentin (NEURONTIN) 100 MG capsule Take 1-2 capsules (100-200 mg total) by mouth at bedtime. Follow written titration schedule.   Glucosamine-Chondroitin (OSTEO BI-FLEX REGULAR STRENGTH PO) Take 1 tablet by mouth in the morning and at bedtime.   hydrocortisone 2.5 % lotion Apply 1 application topically 2 (two) times daily. To affected areas of face only as needed for flares of itching/scaling    hydroxypropyl methylcellulose / hypromellose (ISOPTO TEARS / GONIOVISC) 2.5 % ophthalmic solution Place 1 drop into both eyes 4 (four) times daily as needed for dry eyes.   insulin aspart protamine -  aspart (NOVOLOG MIX 70/30 FLEXPEN) (70-30) 100 UNIT/ML FlexPen 12 units in the am and 6 units after dinner.   Krill Oil 500 MG CAPS Take 1 capsule by mouth daily. [place]   lipase/protease/amylase (CREON) 36000 UNITS CPEP capsule Take 36,000 Units by mouth 3 (three) times daily with meals.   Misc Natural Products (OSTEO BI-FLEX ADV JOINT SHIELD PO) Take by mouth 2 (two) times daily.   Multiple Vitamins-Minerals (MULTIVITAMIN PO) Take 1 tablet by mouth daily.    Multiple Vitamins-Minerals (PRESERVISION AREDS PO) Take by mouth.    omega-3 acid ethyl esters (LOVAZA) 1 g capsule Take by mouth 2 (two) times daily.    pantoprazole (PROTONIX) 40 MG tablet TAKE 1 TABLET BY MOUTH EVERY DAY   primidone (MYSOLINE) 50 MG tablet Take 1 tablet (50 mg total) by mouth 4 (four) times daily. (Patient taking differently: Take 50 mg by mouth at bedtime.)   sertraline (ZOLOFT) 25 MG tablet Take 1 tablet (25 mg total) by mouth daily.   ursodiol (ACTIGALL) 300 MG capsule Take 300 mg by mouth 2 (two) times daily.   vitamin B-12 (CYANOCOBALAMIN) 1000 MCG tablet Take 5,000 mcg by mouth daily.    No facility-administered encounter medications on file as of 12/11/2021.    Allergies (verified) Doxycycline and Hydrocodone bit-homatrop mbr   History: Past Medical History:  Diagnosis Date   Cancer ([place])    pancreatic   Coronary artery disease    Diabetes mellitus without complication ([place])    type 2   Hyperlipidemia    Hypertension    Past Surgical History:  Procedure Laterality Date   APPENDECTOMY     1977   BILIARY DRAINAGE     CORONARY ARTERY BYPASS GRAFT     2001-3 vessel   HERNIA REPAIR Bilateral    2016-left, 2009-right   WHIPPLE PROCEDURE  2007   Family History  Problem Relation Age of Onset   Heart disease Mother    Cancer Mother 2       breast   Heart disease Father    COPD Brother    Stroke Brother    Diabetes Other        1 and 2   Social History   Socioeconomic History   Marital status: Married    Spouse name: Not on file   Number of children: 0   Years of education: Not on file   Highest education level: Some college, no degree  Occupational History   Occupation: retired   Occupation: part time job @ [employer]  Tobacco Use   Smoking status: Former    Packs/day: 0.50    Years: 30.00    Pack years: 15.00    Types: Cigarettes    Quit date: 10/06/1979    Years since quitting: 42.2   Smokeless tobacco: Never  Vaping Use    Vaping Use: Never used  Substance and Sexual Activity   Alcohol use: Yes    Comment: Maybe a beer or 2 a month   Drug use: No   Sexual activity: Not on file  Other Topics Concern   Not on file  Social History Narrative   Not on file   Social Determinants of Health   Financial Resource Strain: Low Risk    Difficulty of Paying Living Expenses: Not hard at all  Food Insecurity: Not on file  Transportation Needs: Not on file  Physical Activity: Not on file  Stress: Not on file  Social Connections: Not on file  Tobacco Counseling Counseling given: Not Answered   Clinical Intake:  Pre-visit preparation completed: Yes  Pain : No/denies pain Pain Score: 0-No pain     Nutritional Risks: None Diabetes: Yes CBG done?: No Did pt. bring in CBG monitor from home?: No  How often do you need to have someone help you when you read instructions, pamphlets, or other written materials from your doctor or pharmacy?: 1 - Never  Diabetic?yes Nutrition Risk Assessment:  Has the patient had any N/V/D within the last 2 months?  Yes  Does the patient have any non-healing wounds?  No  Has the patient had any unintentional weight loss or weight gain?  No   Diabetes:  Is the patient diabetic?  Yes  If diabetic, was a CBG obtained today?  No  Did the patient bring in their glucometer from home?  No  How often do you monitor your CBG's? Wears a meter.   Financial Strains and Diabetes Management:  Are you having any financial strains with the device, your supplies or your medication? No .  Does the patient want to be seen by Chronic Care Management for management of their diabetes?  No  Would the patient like to be referred to a Nutritionist or for Diabetic Management?  No   Diabetic Exams:  Diabetic Eye Exam: Completed 04/24/20. Overdue for diabetic eye exam. Pt has been advised about the importance in completing this exam.   Diabetic Foot Exam: Completed 02/03/17. Pt has been  advised about the importance in completing this exam.   Interpreter Needed?: No  Information entered by :: Kirke Shaggy, LPN   Activities of Daily Living In your present state of health, do you have any difficulty performing the following activities: 08/07/2021  Hearing? N  Vision? N  Difficulty concentrating or making decisions? N  Walking or climbing stairs? N  Dressing or bathing? N  Doing errands, shopping? N  Some recent data might be hidden    Patient Care Team: Jerrol Banana., MD as PCP - General (Family Medicine) Benito Mccreedy, MD as Referring Physician (Family Medicine) Dingeldein, Remo Lipps, MD (Ophthalmology) Gabriel Carina Betsey Holiday, MD as Physician Assistant (Endocrinology) Germaine Pomfret, [facility] as Pharmacist (Pharmacist)  Indicate any recent Medical Services you may have received from other than [facility] providers in the past year (date may be approximate).     Assessment:   This is a routine wellness examination for Anthony Craig.  Hearing/Vision screen No results found.  Dietary issues and exercise activities discussed:     Goals Addressed   None    Depression Screen PHQ 2/9 Scores 12/04/2021 11/13/2021 10/14/2021 08/22/2021 08/07/2021 06/13/2020 06/07/2019  PHQ - 2 Score 0 0 0 1 2 0 0  PHQ- 9 Score - - - - 4 - -    Fall Risk Fall Risk  12/04/2021 11/13/2021 10/14/2021 10/10/2021 09/11/2021  Falls in the past year? 0 0 0 0 0  Number falls in past yr: - 0 - - -  Injury with Fall? - 0 - - -  Risk for fall due to : - - - - -  Follow up - - - - -    [place]:  Any stairs in or around the home? No  If so, are there any without handrails? No  Home free of loose throw rugs in walkways, pet beds, electrical cords, etc? Yes  Adequate lighting in your home to reduce risk of falls? Yes   ASSISTIVE DEVICES  UTILIZED TO PREVENT FALLS:  Life alert? No  Use of a cane, walker or w/c? No  Grab bars in the bathroom? Yes  Shower chair or bench in  shower? Yes  Elevated toilet seat or a handicapped toilet? Yes    Cognitive Function:    6CIT Screen 06/07/2019 05/28/2017  What Year? 0 points 0 points  What month? 0 points 0 points  What time? 0 points 0 points  Count back from 20 0 points 0 points  Months in reverse 0 points 0 points  Repeat phrase 2 points 0 points  Total Score 2 0    Immunizations Immunization History  Administered Date(s) Administered   Fluad Quad(high Dose 65+) 07/11/2020, 07/22/2021   Influenza, High Dose Seasonal PF 07/21/2016, 06/14/2019   Influenza, Seasonal, Injecte, Preservative Fre 07/14/2013, 07/05/2014, 07/09/2015   Influenza,inj,Quad PF,6+ Mos 07/01/2017   Influenza,inj,Quad PF,6-35 Mos 07/01/2017   Influenza-Unspecified 07/06/2006, 07/17/2007, 07/06/2008, 08/02/2009, 08/06/2009, 07/12/2012, 07/09/2015, 06/28/2018, 06/07/2019   PFIZER(Purple Top)SARS-COV-2 Vaccination 10/20/2019, 11/08/2019, 09/13/2020   Pneumococcal Conjugate-13 11/08/2013, 11/14/2013   Pneumococcal Polysaccharide-23 06/16/2007, 06/06/2008, 06/08/2010   Pneumococcal-Unspecified 06/06/2008   Td 06/08/2006   Tdap 04/10/2004, 07/01/2017   Zoster Recombinat (Shingrix) 12/04/2020, 06/06/2021    TDAP status: Up to date  Flu Vaccine status: Up to date  Pneumococcal vaccine status: Up to date  Covid-19 vaccine status: Completed vaccines  Qualifies for Shingles Vaccine? Yes   Zostavax completed No   Shingrix Completed?: Yes  Screening Tests Health Maintenance  Topic Date Due   FOOT EXAM  02/03/2018   URINE MICROALBUMIN  09/07/2019   COVID-19 Vaccine (4 - Booster for Pfizer series) 11/08/2020   OPHTHALMOLOGY EXAM  04/24/2021   HEMOGLOBIN A1C  02/23/2022   TETANUS/TDAP  07/02/2027   Pneumonia Vaccine 34+ Years old  Completed   INFLUENZA VACCINE  Completed   Zoster Vaccines- Shingrix  Completed   HPV VACCINES  Aged Out    Health Maintenance  Health Maintenance Due  Topic Date Due   FOOT EXAM  02/03/2018   URINE  MICROALBUMIN  09/07/2019   COVID-19 Vaccine (4 - Booster for Pfizer series) 11/08/2020   OPHTHALMOLOGY EXAM  04/24/2021    Colorectal cancer screening: No longer required.   Lung Cancer Screening: (Low Dose CT Chest recommended if Age 65-80 years, 30 pack-year currently smoking OR have quit w/in 15years.) does not qualify.    Additional Screening:  Hepatitis C Screening: does not qualify; Completed no  Vision Screening: Recommended annual ophthalmology exams for early detection of glaucoma and other disorders of the eye. Is the patient up to date with their annual eye exam?  Yes  Who is the provider or what is the name of the office in which the patient attends annual eye exams? [facility] If pt is not established with a provider, would they like to be referred to a provider to establish care? No .   Dental Screening: Recommended annual dental exams for proper oral hygiene  Community Resource Referral / Chronic Care Management: CRR required this visit?  No   CCM required this visit?  No      Plan:     I have personally reviewed and noted the following in the patient's chart:   Medical and social history Use of alcohol, tobacco or illicit drugs  Current medications and supplements including opioid prescriptions. Patient is not currently taking opioid prescriptions. Functional ability and status Nutritional status Physical activity Advanced directives List of other physicians Hospitalizations, surgeries, and ER visits  in previous 12 months Vitals Screenings to include cognitive, depression, and falls Referrals and appointments  In addition, I have reviewed and discussed with patient certain preventive protocols, quality metrics, and best practice recommendations. A written personalized care plan for preventive services as well as general preventive health recommendations were provided to patient.     Dionisio David, LPN   01/06/3535   Nurse Notes:  none

## 2021-12-13 ENCOUNTER — Encounter: Payer: Self-pay | Admitting: *Deleted

## 2021-12-13 NOTE — Progress Notes (Unsigned)
Established patient visit  I,[name],acting as a scribe for Anthony Durie, MD.,have documented all relevant documentation on the behalf of Anthony Durie, MD,as directed by  Anthony Durie, MD while in the presence of Anthony Durie, MD.   Patient: Anthony Craig   DOB: 1927/12/22   88 y.o. Male  MRN: [id number] Visit Date: 12/16/2021  Today's healthcare provider: Wilhemena Durie, MD   Chief Complaint  Patient presents with   Follow-up   Diabetes   Hypertension   Anxiety   Subjective    HPI  Diabetes Mellitus Type II, follow-up  Lab Results  Component Value Date   HGBA1C 7.7 (A) 12/16/2021   HGBA1C 8.0 08/26/2021   HGBA1C 8.2 (A) 12/10/2020   Last seen for diabetes 4 months ago.  Management since then includes continuing the same treatment. Checks at [place]? He reports good compliance with treatment. He is not having side effects. none  Home blood sugar records: fasting range: 200  Episodes of hypoglycemia? No none   Current insulin regiment: n/a Most Recent Eye Exam: 04/24/2020  --------------------------------------------------------------------------------------------------- Hypertension, follow-up  BP Readings from Last 3 Encounters:  12/16/21 134/79  12/04/21 (!) 150/91  11/13/21 (!) 158/90   Wt Readings from Last 3 Encounters:  12/16/21 150 lb (68 kg)  12/04/21 150 lb (68 kg)  11/13/21 150 lb (68 kg)     He was last seen for hypertension 4 months ago.  BP at that visit was 130/63. Management since that visit includes; not currently taking a medication. He reports good compliance with treatment. He is not having side effects. none He is exercising. He is adherent to low salt diet.   Outside blood pressures are checks occasionally.  He does not smoke.  Use of agents associated with hypertension: none.   --------------------------------------------------------------------------------------------------- Anxiety,  Follow-up  He was last seen for anxiety 4 months ago. Changes made at last visit include; Try low-dose sertraline.  Follow-up in 3 to 4 months.   He reports good compliance with treatment. He reports good tolerance of treatment. He is not having side effects. none  He feels his anxiety is mild and Improved since last visit.  GAD-7 Results GAD-7 Generalized Anxiety Disorder Screening Tool 08/07/2021  1. Feeling Nervous, Anxious, or on Edge 1  2. Not Being Able to Stop or Control Worrying 1  3. Worrying Too Much About Different Things 1  4. Trouble Relaxing 0  5. Being So Restless it's Hard To Sit Still 0  6. Becoming Easily Annoyed or Irritable 1  7. Feeling Afraid As If Something Awful Might Happen 0  Total GAD-7 Score 4  Difficulty At Work, Home, or Getting  Along With Others? Somewhat difficult    PHQ-9 Scores PHQ9 SCORE ONLY 12/11/2021 12/04/2021 11/13/2021  PHQ-9 Total Score 0 0 0   Patient is not taking sertraline. ---------------------------------------------------------------------------------------------------   Medications: Outpatient Medications Prior to Visit  Medication Sig   Alcohol Swabs (B-D SINGLE USE SWABS REGULAR) PADS USE AS DIRECTED  WITH  EACH  FINGERSTICK   aspirin EC 81 MG tablet Take 81 mg by mouth daily.   atorvastatin (LIPITOR) 40 MG tablet TAKE 1 TABLET BY MOUTH EVERYDAY AT BEDTIME (Patient taking differently: 20 mg.)   b complex vitamins tablet Take 1 tablet by mouth daily.   calcium-vitamin D 250-100 MG-UNIT per tablet Take 1 tablet by mouth daily.   cholecalciferol (VITAMIN D) 1000 UNITS tablet Take 1,000 Units by mouth daily.  Coenzyme Q10 (COQ10) 200 MG CAPS Take 100 mg by mouth daily.    diphenoxylate-atropine (LOMOTIL) 2.5-0.025 MG per tablet Take 1 tablet by mouth 4 (four) times daily as needed for diarrhea or loose stools.   Glucosamine-Chondroitin (OSTEO BI-FLEX REGULAR STRENGTH PO) Take 1 tablet by mouth in the morning and at bedtime.    hydrocortisone 2.5 % lotion Apply 1 application topically 2 (two) times daily. To affected areas of face only as needed for flares of itching/scaling    hydroxypropyl methylcellulose / hypromellose (ISOPTO TEARS / GONIOVISC) 2.5 % ophthalmic solution Place 1 drop into both eyes 4 (four) times daily as needed for dry eyes.   insulin aspart protamine - aspart (NOVOLOG MIX 70/30 FLEXPEN) (70-30) 100 UNIT/ML FlexPen 12 units in the am and 6 units after dinner.   Krill Oil 500 MG CAPS Take 1 capsule by mouth daily. [place]   lipase/protease/amylase (CREON) 36000 UNITS CPEP capsule Take 36,000 Units by mouth 3 (three) times daily with meals.   Multiple Vitamins-Minerals (MULTIVITAMIN PO) Take 1 tablet by mouth daily.   Multiple Vitamins-Minerals (PRESERVISION AREDS PO) Take by mouth.    omega-3 acid ethyl esters (LOVAZA) 1 g capsule Take by mouth 2 (two) times daily.    omeprazole (PRILOSEC) 20 MG capsule Take 20 mg by mouth 2 (two) times daily.   pantoprazole (PROTONIX) 40 MG tablet TAKE 1 TABLET BY MOUTH EVERY DAY   primidone (MYSOLINE) 50 MG tablet Take 1 tablet (50 mg total) by mouth 4 (four) times daily. (Patient taking differently: Take 50 mg by mouth at bedtime.)   ursodiol (ACTIGALL) 300 MG capsule Take 300 mg by mouth 2 (two) times daily.   vitamin B-12 (CYANOCOBALAMIN) 1000 MCG tablet Take 5,000 mcg by mouth daily.    sertraline (ZOLOFT) 25 MG tablet Take 1 tablet (25 mg total) by mouth daily. (Patient not taking: Reported on 12/16/2021)   [DISCONTINUED] diphenhydrAMINE (BENADRYL) 25 MG tablet Take 25 mg by mouth every 6 (six) hours as needed. (Patient not taking: Reported on 12/16/2021)   [DISCONTINUED] famotidine (PEPCID) 20 MG tablet Take 1 tablet (20 mg total) by mouth 2 (two) times daily. (Patient not taking: Reported on 12/16/2021)   [DISCONTINUED] gabapentin (NEURONTIN) 100 MG capsule Take 1-2 capsules (100-200 mg total) by mouth at bedtime. Follow written titration schedule.    [DISCONTINUED] Misc Natural Products (OSTEO BI-FLEX ADV JOINT SHIELD PO) Take by mouth 2 (two) times daily. (Patient not taking: Reported on 12/11/2021)   No facility-administered medications prior to visit.    Review of Systems  Constitutional:  Negative for appetite change, chills and fever.  Respiratory:  Negative for chest tightness, shortness of breath and wheezing.   Cardiovascular:  Negative for chest pain and palpitations.  Gastrointestinal:  Negative for abdominal pain, nausea and vomiting.   {Labs   Heme   Chem   Endocrine   Serology   Results Review (optional):23779}   Objective    BP 134/79 (BP Location: Right Arm, Patient Position: Sitting, Cuff Size: Normal)    Pulse 67    Temp 98.1 F (36.7 C) (Temporal)    Resp 16    Ht 5' 10.5" (1.791 m)    Wt 150 lb (68 kg)    SpO2 95%    BMI 21.22 kg/m  {Show previous vital signs (optional):23777}  Physical Exam  ***  Results for orders placed or performed in visit on 12/16/21  POCT glycosylated hemoglobin (Hb A1C)  Result Value Ref Range   Hemoglobin A1C  7.7 (A) 4.0 - 5.6 %   Est. average glucose Bld gHb Est-mCnc 174     Assessment & Plan     1. Type 2 diabetes mellitus with other circulatory complication, with long-term current use of insulin (HCC)  - POCT glycosylated hemoglobin (Hb A1C)-7.7 today.  2. Essential hypertension   3. GAD (generalized anxiety disorder)    Return in about 6 months (around 06/18/2022).      {provider attestation***:1}   Anthony Durie, MD  [facility] [phone] (phone) [phone] (fax)  [place]

## 2021-12-16 ENCOUNTER — Ambulatory Visit (INDEPENDENT_AMBULATORY_CARE_PROVIDER_SITE_OTHER): Payer: Medicare PPO | Admitting: Family Medicine

## 2021-12-16 ENCOUNTER — Encounter: Payer: Self-pay | Admitting: Family Medicine

## 2021-12-16 ENCOUNTER — Other Ambulatory Visit: Payer: Self-pay

## 2021-12-16 VITALS — BP 134/79 | HR 67 | Temp 98.1°F | Resp 16 | Ht 70.5 in | Wt 150.0 lb

## 2021-12-16 DIAGNOSIS — M5136 Other intervertebral disc degeneration, lumbar region: Secondary | ICD-10-CM | POA: Diagnosis not present

## 2021-12-16 DIAGNOSIS — Z794 Long term (current) use of insulin: Secondary | ICD-10-CM

## 2021-12-16 DIAGNOSIS — I10 Essential (primary) hypertension: Secondary | ICD-10-CM | POA: Diagnosis not present

## 2021-12-16 DIAGNOSIS — Z8507 Personal history of malignant neoplasm of pancreas: Secondary | ICD-10-CM

## 2021-12-16 DIAGNOSIS — M51369 Other intervertebral disc degeneration, lumbar region without mention of lumbar back pain or lower extremity pain: Secondary | ICD-10-CM

## 2021-12-16 DIAGNOSIS — Z951 Presence of aortocoronary bypass graft: Secondary | ICD-10-CM | POA: Diagnosis not present

## 2021-12-16 DIAGNOSIS — F411 Generalized anxiety disorder: Secondary | ICD-10-CM

## 2021-12-16 DIAGNOSIS — E1159 Type 2 diabetes mellitus with other circulatory complications: Secondary | ICD-10-CM | POA: Diagnosis not present

## 2021-12-16 DIAGNOSIS — J301 Allergic rhinitis due to pollen: Secondary | ICD-10-CM | POA: Diagnosis not present

## 2021-12-16 DIAGNOSIS — Z8546 Personal history of malignant neoplasm of prostate: Secondary | ICD-10-CM

## 2021-12-16 LAB — POCT GLYCOSYLATED HEMOGLOBIN (HGB A1C)
Est. average glucose Bld gHb Est-mCnc: 174
Hemoglobin A1C: 7.7 % — AB (ref 4.0–5.6)

## 2021-12-16 MED ORDER — FLUTICASONE PROPIONATE 50 MCG/ACT NA SUSP: 2.0000 | Freq: Every day | NASAL | 6 refills | Status: DC

## 2021-12-17 ENCOUNTER — Encounter: Payer: Self-pay | Admitting: Family Medicine

## 2021-12-18 ENCOUNTER — Other Ambulatory Visit: Payer: Self-pay

## 2021-12-18 ENCOUNTER — Ambulatory Visit
Admission: RE | Admit: 2021-12-18 | Discharge: 2021-12-18 | Disposition: A | Discharge status: Home / Self Care | Payer: No Typology Code available for payment source | Source: Ambulatory Visit | Attending: Student in an Organized Health Care Education/Training Program | Admitting: Student in an Organized Health Care Education/Training Program

## 2021-12-18 ENCOUNTER — Ambulatory Visit (HOSPITAL_BASED_OUTPATIENT_CLINIC_OR_DEPARTMENT_OTHER)
Payer: No Typology Code available for payment source | Admitting: Student in an Organized Health Care Education/Training Program

## 2021-12-18 ENCOUNTER — Encounter: Payer: Self-pay | Admitting: Student in an Organized Health Care Education/Training Program

## 2021-12-18 DIAGNOSIS — G894 Chronic pain syndrome: Secondary | ICD-10-CM | POA: Insufficient documentation

## 2021-12-18 DIAGNOSIS — M51369 Other intervertebral disc degeneration, lumbar region without mention of lumbar back pain or lower extremity pain: Secondary | ICD-10-CM

## 2021-12-18 DIAGNOSIS — M5136 Other intervertebral disc degeneration, lumbar region: Secondary | ICD-10-CM | POA: Diagnosis not present

## 2021-12-18 DIAGNOSIS — M48062 Spinal stenosis, lumbar region with neurogenic claudication: Secondary | ICD-10-CM | POA: Diagnosis present

## 2021-12-18 DIAGNOSIS — M47816 Spondylosis without myelopathy or radiculopathy, lumbar region: Secondary | ICD-10-CM

## 2021-12-18 MED ORDER — LIDOCAINE HCL 2 % IJ SOLN
INTRAMUSCULAR | Status: AC
  Filled 2021-12-18: qty 20

## 2021-12-18 MED ORDER — LIDOCAINE HCL 2 % IJ SOLN
20.0000 mL | Freq: Once | INTRAMUSCULAR | Status: AC
  Administered 2021-12-18: 400 mg

## 2021-12-18 MED ORDER — ROPIVACAINE HCL 2 MG/ML IJ SOLN
9.0000 mL | Freq: Once | INTRAMUSCULAR | Status: AC
  Administered 2021-12-18: 9 mL via PERINEURAL

## 2021-12-18 MED ORDER — DEXAMETHASONE SODIUM PHOSPHATE 10 MG/ML IJ SOLN
INTRAMUSCULAR | Status: AC
  Filled 2021-12-18: qty 1

## 2021-12-18 MED ORDER — DEXAMETHASONE SODIUM PHOSPHATE 10 MG/ML IJ SOLN
10.0000 mg | Freq: Once | INTRAMUSCULAR | Status: AC
  Administered 2021-12-18: 10 mg

## 2021-12-18 MED ORDER — ROPIVACAINE HCL 2 MG/ML IJ SOLN
INTRAMUSCULAR | Status: AC
  Filled 2021-12-18: qty 20

## 2021-12-18 NOTE — Patient Instructions (Signed)

## 2021-12-18 NOTE — Progress Notes (Signed)
Safety precautions to be maintained throughout the outpatient stay will include: orient to surroundings, keep bed in low position, maintain call bell within reach at all times, provide assistance with transfer out of bed and ambulation.  

## 2021-12-18 NOTE — Progress Notes (Signed)
PROVIDER NOTE: Interpretation of information contained herein should be left to medically-trained personnel. Specific patient instructions are provided elsewhere under "Patient Instructions" section of medical record. This document was created in part using STT-dictation technology, any transcriptional errors that may result from this process are unintentional.  Patient: Anthony Craig Type: Established DOB: 07-Nov-1927 MRN: [id number] PCP: Maple Hudson., MD  Service: Procedure DOS: 12/18/2021 Setting: Ambulatory Location: Ambulatory outpatient facility Delivery: Face-to-face Provider: Edward Jolly, MD Specialty: Interventional Pain Management Specialty designation: 09 Location: Outpatient facility Ref. Prov.: Edward Jolly, MD    Primary Reason for Visit: Interventional Pain Management Treatment. CC: Back Pain  Procedure:           Type: Lumbar Facet, Medial Branch Radiofrequency Ablation (RFA) #1  Laterality: Left  Level: L3, L4, L5, Medial Branch Level(s). These levels will denervate the L3-4 and L5-S1 lumbar facet joints.  Imaging: Fluoroscopic guidance Anesthesia: Local anesthesia (1-2% Lidocaine) Anxiolysis: None                 Sedation: None. DOS: 12/18/2021  Performed by: Edward Jolly, MD  Purpose: Therapeutic/Palliative Indications: Low back pain severe enough to impact quality of life or function. Indications: 1. Lumbar spondylosis   2. Lumbar degenerative disc disease   3. Spinal stenosis, lumbar region, with neurogenic claudication   4. Chronic pain syndrome    Mr. Ahn has been dealing with the above chronic pain for longer than three months and has either failed to respond, was unable to tolerate, or simply did not get enough benefit from other more conservative therapies including, but not limited to: 1. Over-the-counter medications 2. Anti-inflammatory medications 3. Muscle relaxants 4. Membrane stabilizers 5. Opioids 6. Physical therapy  and/or chiropractic manipulation 7. Modalities (Heat, ice, etc.) 8. Invasive techniques such as nerve blocks. Mr. Andrada has attained more than 50% relief of the pain from a series of diagnostic injections conducted in separate occasions.  Pain Score: Pre-procedure: 3 /10 Post-procedure: 3 /10     Position / Prep / Materials:  Position: Prone  Prep solution: DuraPrep (Iodine Povacrylex [0.7% available iodine] and Isopropyl Alcohol, 74% w/w) Prep Area: Entire Lumbosacral Region (Lower back from mid-thoracic region to end of tailbone and from flank to flank.) Materials:  Tray: RFA (Radiofrequency) tray Needle(s):  Type: RFA (Teflon-coated radiofrequency ablation needles) Gauge (G): 22  Length: Regular (10cm) Qty: 3  Pre-op H&P Assessment:  Mr. Drechsler is a 88 y.o. (year old), male patient, seen today for interventional treatment. He  has a past surgical history that includes Hernia repair (Bilateral); Whipple procedure (2007); Coronary artery bypass graft; Appendectomy; and Biliary drainage. Mr. Mancusi has a current medication list which includes the following prescription(s): b-d single use swabs regular, aspirin ec, atorvastatin, b complex vitamins, calcium-vitamin d, cholecalciferol, coq10, diphenoxylate-atropine, fluticasone, glucosamine-chondroitin, hydrocortisone, hydroxypropyl methylcellulose / hypromellose, novolog mix 70/30 flexpen, krill oil, lipase/protease/amylase, multiple vitamin, multiple vitamins-minerals, omega-3 acid ethyl esters, omeprazole, pantoprazole, primidone, sertraline, ursodiol, and vitamin b-12. His primarily concern today is the Back Pain  Initial Vital Signs:  Pulse/HCG Rate: 67ECG Heart Rate: 71 Temp: 98 F (36.7 C) Resp: 18 BP: 134/68 SpO2: 96 %  BMI: Estimated body mass index is 21.52 kg/m as calculated from the following:   Height as of this encounter: 5\' 10"  (1.778 m).   Weight as of this encounter: 150 lb (68 kg).  Risk  Assessment: Allergies: Reviewed. He is allergic to doxycycline and hydrocodone bit-homatrop mbr.  Allergy Precautions: None required Coagulopathies: Reviewed. None identified.  Blood-thinner therapy: None at this time Active Infection(s): Reviewed. None identified. Mr. Beisler is afebrile  Site Confirmation: Mr. Burnet was asked to confirm the procedure and laterality before marking the site Procedure checklist: Completed Consent: Before the procedure and under the influence of no sedative(s), amnesic(s), or anxiolytics, the patient was informed of the treatment options, risks and possible complications. To fulfill our ethical and legal obligations, as recommended by the American Medical Association's Code of Ethics, I have informed the patient of my clinical impression; the nature and purpose of the treatment or procedure; the risks, benefits, and possible complications of the intervention; the alternatives, including doing nothing; the risk(s) and benefit(s) of the alternative treatment(s) or procedure(s); and the risk(s) and benefit(s) of doing nothing. The patient was provided information about the general risks and possible complications associated with the procedure. These may include, but are not limited to: failure to achieve desired goals, infection, bleeding, organ or nerve damage, allergic reactions, paralysis, and death. In addition, the patient was informed of those risks and complications associated to Spine-related procedures, such as failure to decrease pain; infection (i.e.: Meningitis, epidural or intraspinal abscess); bleeding (i.e.: epidural hematoma, subarachnoid hemorrhage, or any other type of intraspinal or peri-dural bleeding); organ or nerve damage (i.e.: Any type of peripheral nerve, nerve root, or spinal cord injury) with subsequent damage to sensory, motor, and/or autonomic systems, resulting in permanent pain, numbness, and/or weakness of one or several areas of the body;  allergic reactions; (i.e.: anaphylactic reaction); and/or death. Furthermore, the patient was informed of those risks and complications associated with the medications. These include, but are not limited to: allergic reactions (i.e.: anaphylactic or anaphylactoid reaction(s)); adrenal axis suppression; blood sugar elevation that in diabetics may result in ketoacidosis or comma; water retention that in patients with history of congestive heart failure may result in shortness of breath, pulmonary edema, and decompensation with resultant heart failure; weight gain; swelling or edema; medication-induced neural toxicity; particulate matter embolism and blood vessel occlusion with resultant organ, and/or nervous system infarction; and/or aseptic necrosis of one or more joints. Finally, the patient was informed that Medicine is not an exact science; therefore, there is also the possibility of unforeseen or unpredictable risks and/or possible complications that may result in a catastrophic outcome. The patient indicated having understood very clearly. We have given the patient no guarantees and we have made no promises. Enough time was given to the patient to ask questions, all of which were answered to the patient's satisfaction. Mr. Cropley has indicated that he wanted to continue with the procedure. Attestation: I, the ordering provider, attest that I have discussed with the patient the benefits, risks, side-effects, alternatives, likelihood of achieving goals, and potential problems during recovery for the procedure that I have provided informed consent. Date  Time: 12/18/2021  7:43 AM  Pre-Procedure Preparation:  Monitoring: As per clinic protocol. Respiration, ETCO2, SpO2, BP, heart rate and rhythm monitor placed and checked for adequate function Safety Precautions: Patient was assessed for positional comfort and pressure points before starting the procedure. Time-out: I initiated and conducted the  "Time-out" before starting the procedure, as per protocol. The patient was asked to participate by confirming the accuracy of the "Time Out" information. Verification of the correct person, site, and procedure were performed and confirmed by me, the nursing staff, and the patient. "Time-out" conducted as per Joint Commission's Universal Protocol (UP.01.01.01). Time: 0821  Description of Procedure:          Laterality: Left Levels:  L3, L4, L5, Medial Branch Level(s), at the L3-4 and L5-S1 lumbar facet joints. Safety Precautions: Aspiration looking for blood return was conducted prior to all injections. At no point did we inject any substances, as a needle was being advanced. Before injecting, the patient was told to immediately notify me if he was experiencing any new onset of "ringing in the ears, or metallic taste in the mouth". No attempts were made at seeking any paresthesias. Safe injection practices and needle disposal techniques used. Medications properly checked for expiration dates. SDV (single dose vial) medications used. After the completion of the procedure, all disposable equipment used was discarded in the proper designated medical waste containers. Local Anesthesia: Protocol guidelines were followed. The patient was positioned over the fluoroscopy table. The area was prepped in the usual manner. The time-out was completed. The target area was identified using fluoroscopy. A 12-in long, straight, sterile hemostat was used with fluoroscopic guidance to locate the targets for each level blocked. Once located, the skin was marked with an approved surgical skin marker. Once all sites were marked, the skin (epidermis, dermis, and hypodermis), as well as deeper tissues (fat, connective tissue and muscle) were infiltrated with a small amount of a short-acting local anesthetic, loaded on a 10cc syringe with a 25G, 1.5-in  Needle. An appropriate amount of time was allowed for local anesthetics to take  effect before proceeding to the next step. Technical description of process:  Radiofrequency Ablation (RFA) L3 Medial Branch Nerve RFA: The target area for the L3 medial branch is at the junction of the postero-lateral aspect of the superior articular process and the superior, posterior, and medial edge of the transverse process of L4. Under fluoroscopic guidance, a Radiofrequency needle was inserted until contact was made with os over the superior postero-lateral aspect of the pedicular shadow (target area). Sensory and motor testing was conducted to properly adjust the position of the needle. Once satisfactory placement of the needle was achieved, the numbing solution was slowly injected after negative aspiration for blood. 2.0 mL of the nerve block solution was injected without difficulty or complication. After waiting for at least 3 minutes, the ablation was performed. Once completed, the needle was removed intact. L4 Medial Branch Nerve RFA: The target area for the L4 medial branch is at the junction of the postero-lateral aspect of the superior articular process and the superior, posterior, and medial edge of the transverse process of L5. Under fluoroscopic guidance, a Radiofrequency needle was inserted until contact was made with os over the superior postero-lateral aspect of the pedicular shadow (target area). Sensory and motor testing was conducted to properly adjust the position of the needle. Once satisfactory placement of the needle was achieved, the numbing solution was slowly injected after negative aspiration for blood. 2.0 mL of the nerve block solution was injected without difficulty or complication. After waiting for at least 3 minutes, the ablation was performed. Once completed, the needle was removed intact. L5 Medial Branch Nerve RFA: The target area for the L5 medial branch is at the junction of the postero-lateral aspect of the superior articular process of S1 and the superior, posterior,  and medial edge of the sacral ala. Under fluoroscopic guidance, a Radiofrequency needle was inserted until contact was made with os over the superior postero-lateral aspect of the pedicular shadow (target area). Sensory and motor testing was conducted to properly adjust the position of the needle. Once satisfactory placement of the needle was achieved, the numbing solution was slowly injected  after negative aspiration for blood. 2.0 mL of the nerve block solution was injected without difficulty or complication. After waiting for at least 3 minutes, the ablation was performed. Once completed, the needle was removed intact. Radiofrequency lesioning (ablation):  Radiofrequency Generator: Medtronic AccurianTM AG 1000 RF Generator Sensory Stimulation Parameters: 50 Hz was used to locate & identify the nerve, making sure that the needle was positioned such that there was no sensory stimulation below 0.3 V or above 0.7 V. Motor Stimulation Parameters: 2 Hz was used to evaluate the motor component. Care was taken not to lesion any nerves that demonstrated motor stimulation of the lower extremities at an output of less than 2.5 times that of the sensory threshold, or a maximum of 2.0 V. Lesioning Technique Parameters: Standard Radiofrequency settings. (Not bipolar or pulsed.) Temperature Settings: 80 degrees C Lesioning time: 60 seconds Intra-operative Compliance: Compliant  6 cc solution made of 5 cc of 0.2% ropivacaine, 1 cc of Decadron 10 mg/cc.  2 cc injected at each level above for the left medial branch nerves after sensorimotor testing, prior to lesioning.  Once the entire procedure was completed, the treated area was cleaned, making sure to leave some of the prepping solution back to take advantage of its long term bactericidal properties.    Illustration of the posterior view of the lumbar spine and the posterior neural structures. Laminae of L2 through S1 are labeled. DPRL5, dorsal primary ramus of  L5; DPRS1, dorsal primary ramus of S1; DPR3, dorsal primary ramus of L3; FJ, facet (zygapophyseal) joint L3-L4; I, inferior articular process of L4; LB1, lateral branch of dorsal primary ramus of L1; IAB, inferior articular branches from L3 medial branch (supplies L4-L5 facet joint); IBP, intermediate branch plexus; MB3, medial branch of dorsal primary ramus of L3; NR3, third lumbar nerve root; S, superior articular process of L5; SAB, superior articular branches from L4 (supplies L4-5 facet joint also); TP3, transverse process of L3.  Vitals:   12/18/21 0826 12/18/21 0831 12/18/21 0836 12/18/21 0840  BP: (!) 152/80 (!) 162/83 (!) 157/81 (P) 139/90  Pulse:      Resp: 15 17 12  (P) 14  Temp:      SpO2: 100% 97% 98% (P) 99%  Weight:      Height:       Start Time: 0821 hrs. End Time: 0839 hrs.  Imaging Guidance (Spinal):          Type of Imaging Technique: Fluoroscopy Guidance (Spinal) Indication(s): Assistance in needle guidance and placement for procedures requiring needle placement in or near specific anatomical locations not easily accessible without such assistance. Exposure Time: Please see nurses notes. Contrast: None used. Fluoroscopic Guidance: I was personally present during the use of fluoroscopy. "Tunnel Vision Technique" used to obtain the best possible view of the target area. Parallax error corrected before commencing the procedure. "Direction-depth-direction" technique used to introduce the needle under continuous pulsed fluoroscopy. Once target was reached, antero-posterior, oblique, and lateral fluoroscopic projection used confirm needle placement in all planes. Images permanently stored in EMR. Interpretation: No contrast injected. I personally interpreted the imaging intraoperatively. Adequate needle placement confirmed in multiple planes. Permanent images saved into the patient's record.  Post-operative Assessment:  Post-procedure Vital Signs:  Pulse/HCG Rate: 67(P)  76 Temp: 98 F (36.7 C) Resp: (P) 14 BP: (P) 139/90 SpO2: (P) 99 %  EBL: None  Complications: No immediate post-treatment complications observed by team, or reported by patient.  Note: The patient tolerated the entire procedure well. A  repeat set of vitals were taken after the procedure and the patient was kept under observation following institutional policy, for this type of procedure. Post-procedural neurological assessment was performed, showing return to baseline, prior to discharge. The patient was provided with post-procedure discharge instructions, including a section on how to identify potential problems. Should any problems arise concerning this procedure, the patient was given instructions to immediately contact us, at any time, without hesitation. In any case, we plan to contact the patient by telephone for a follow-up status report regarding this interventional procedure.  Comments:  No additional relevant information.  Plan of Care  Orders:  Orders Placed This Encounter  Procedures   DG PAIN CLIN[facility] C-ARM 1-60 MIN NO REPORT    Intraoperative interpretation by procedural physician at [facility].    Standing Status:   Standing    Number of Occurrences:   1    Order Specific Question:   Reason for exam:    Answer:   Assistance in needle guidance and placement for procedures requiring needle placement in or near specific anatomical locations not easily accessible without such assistance.    Medications ordered for procedure: Meds ordered this encounter  Medications   lidocaine (XYLOCAINE) 2 % (with pres) injection 400 mg   dexamethasone (DECADRON) injection 10 mg   ropivacaine (PF) 2 mg/mL (0.2%) (NAROPIN) injection 9 mL   Medications administered: We administered lidocaine, dexamethasone, and ropivacaine (PF) 2 mg/mL (0.2%).  See the medical record for exact dosing, route, and time of administration.  Follow-up plan:   Return in about 4 weeks (around  01/15/2022) for Post Procedure Evaluation, virtual.       L4/5 ESI 09/11/21, B/L L3,4,5 MBNB #1 10/14/21 (80% pain relief for the first 2 weeks), 11/13/21 (100% pain relief for the first week), R L3,4,5 RFA 12/04/21, L L3,4,5 RFA 12/18/21     Recent Visits Date Type Provider Dept  12/04/21 Procedure visit Edward Jolly, MD [facility]  11/27/21 Office Visit Edward Jolly, MD [facility]  11/13/21 Procedure visit Edward Jolly, MD [facility]  11/11/21 Office Visit Edward Jolly, MD [facility]  10/14/21 Procedure visit Edward Jolly, MD [facility]  10/10/21 Office Visit Edward Jolly, MD [facility]  Showing recent visits within past 90 days and meeting all other requirements Today's Visits Date Type Provider Dept  12/18/21 Procedure visit Edward Jolly, MD [facility]  Showing today's visits and meeting all other requirements Future Appointments No visits were found meeting these conditions. Showing future appointments within next 90 days and meeting all other requirements  Disposition: Discharge home  Discharge (Date  Time): 12/18/2021;   hrs.   Primary Care Physician: Maple Hudson., MD Location: [facility] Note by: Edward Jolly, MD Date: 12/18/2021; Time: 9:05 AM  Disclaimer:  Medicine is not an exact science. The only guarantee in medicine is that nothing is guaranteed. It is important to note that the decision to proceed with this intervention was based on the information collected from the patient. The Data and conclusions were drawn from the patient's questionnaire, the interview, and the physical examination. Because the information was provided in large part by the patient, it cannot be guaranteed that it has not been purposely or unconsciously manipulated. Every effort has been made to obtain as much relevant data as possible for this evaluation. It is important to note that the  conclusions that lead to this procedure are derived in  large part from the available data. Always take into account that the treatment will also be dependent on availability of resources and existing treatment guidelines, considered by other Pain Management Practitioners as being common knowledge and practice, at the time of the intervention. For Medico-Legal purposes, it is also important to point out that variation in procedural techniques and pharmacological choices are the acceptable norm. The indications, contraindications, technique, and results of the above procedure should only be interpreted and judged by a Board-Certified Interventional Pain Specialist with extensive familiarity and expertise in the same exact procedure and technique.

## 2021-12-19 ENCOUNTER — Telehealth: Payer: Self-pay

## 2021-12-19 NOTE — Telephone Encounter (Signed)
Post procedure phone call follow up.  Patient wife states he is doing well but resting.  Instructed to call for any questions or concerns.    ?

## 2021-12-25 ENCOUNTER — Ambulatory Visit
Payer: No Typology Code available for payment source | Admitting: Student in an Organized Health Care Education/Training Program

## 2022-01-01 ENCOUNTER — Ambulatory Visit
Payer: No Typology Code available for payment source | Attending: Student in an Organized Health Care Education/Training Program | Admitting: Student in an Organized Health Care Education/Training Program

## 2022-01-01 ENCOUNTER — Ambulatory Visit
Payer: No Typology Code available for payment source | Admitting: Student in an Organized Health Care Education/Training Program

## 2022-01-01 ENCOUNTER — Other Ambulatory Visit: Payer: Self-pay

## 2022-01-01 DIAGNOSIS — M47816 Spondylosis without myelopathy or radiculopathy, lumbar region: Secondary | ICD-10-CM

## 2022-01-01 DIAGNOSIS — G894 Chronic pain syndrome: Secondary | ICD-10-CM

## 2022-01-01 DIAGNOSIS — M48062 Spinal stenosis, lumbar region with neurogenic claudication: Secondary | ICD-10-CM

## 2022-01-01 DIAGNOSIS — M5136 Other intervertebral disc degeneration, lumbar region: Secondary | ICD-10-CM

## 2022-01-01 NOTE — Progress Notes (Signed)
I attempted to call the patient however no response. Voicemail left instructing patient to call front desk office at [phone] to reschedule appointment. ?-Dr Holley Raring ? ? ?Post-procedure evaluation  ?  Type: Lumbar Facet, Medial Branch Radiofrequency Ablation (RFA) #1  ?Laterality: Left  ?Level: L3, L4, L5, Medial Branch Level(s). These levels will denervate the L3-4 and L5-S1 lumbar facet joints.  ?Imaging: Fluoroscopic guidance ?Anesthesia: Local anesthesia (1-2% Lidocaine) ?Anxiolysis: None                 ?Sedation: None. ?DOS: 12/18/2021  ?Performed by: Gillis Santa, MD ? ?Purpose: Therapeutic/Palliative ?Indications: Low back pain severe enough to impact quality of life or function. ?Indications: ?1. Lumbar spondylosis   ?2. Lumbar degenerative disc disease   ?3. Spinal stenosis, lumbar region, with neurogenic claudication   ?4. Chronic pain syndrome   ? ?Anthony Craig has been dealing with the above chronic pain for longer than three months and has either failed to respond, was unable to tolerate, or simply did not get enough benefit from other more conservative therapies including, but not limited to: ?1. Over-the-counter medications ?2. Anti-inflammatory medications ?3. Muscle relaxants ?4. Membrane stabilizers ?5. Opioids ?6. Physical therapy and/or chiropractic manipulation ?7. Modalities (Heat, ice, etc.) ?8. Invasive techniques such as nerve blocks. ?Anthony Craig has attained more than 50% relief of the pain from a series of diagnostic injections conducted in separate occasions. ? ?Pain Score: ?Pre-procedure: 3 /10 ?Post-procedure: 3 /10 ? ?   ?Effectiveness:  ?Initial hour after procedure: 100 %  ?Subsequent 4-6 hours post-procedure: 100 %  ?Analgesia past initial 6 hours: 90 % (no intense pain at this time.  describes a dull pain in his lower back.  left side is better than the right currently.)  ?Ongoing improvement:  ? ?

## 2022-01-06 ENCOUNTER — Ambulatory Visit
Payer: No Typology Code available for payment source | Attending: Student in an Organized Health Care Education/Training Program | Admitting: Student in an Organized Health Care Education/Training Program

## 2022-01-06 DIAGNOSIS — M48062 Spinal stenosis, lumbar region with neurogenic claudication: Secondary | ICD-10-CM

## 2022-01-06 DIAGNOSIS — M51369 Other intervertebral disc degeneration, lumbar region without mention of lumbar back pain or lower extremity pain: Secondary | ICD-10-CM

## 2022-01-06 DIAGNOSIS — M5136 Other intervertebral disc degeneration, lumbar region: Secondary | ICD-10-CM

## 2022-01-06 DIAGNOSIS — G894 Chronic pain syndrome: Secondary | ICD-10-CM | POA: Diagnosis not present

## 2022-01-06 DIAGNOSIS — M47816 Spondylosis without myelopathy or radiculopathy, lumbar region: Secondary | ICD-10-CM | POA: Diagnosis not present

## 2022-01-06 NOTE — Progress Notes (Signed)
Patient: Anthony Craig  Service Category: E/M  Provider: Gillis Santa, MD  ?DOB: [birth year]  DOS: 01/06/2022  Location: Office  ?MRN: [id number]  Setting: Ambulatory outpatient  Referring Provider: Jerrol Banana.,*  ?Type: Established Patient  Specialty: Interventional Pain Management  PCP: Jerrol Banana., MD  ?Location: Remote location  Delivery: TeleHealth    ? ?Virtual Encounter - Pain Management ?PROVIDER NOTE: Information contained herein reflects review and annotations entered in association with encounter. Interpretation of such information and data should be left to medically-trained personnel. Information provided to patient can be located elsewhere in the medical record under "Patient Instructions". Document created using STT-dictation technology, any transcriptional errors that may result from process are unintentional.  ?  ?Contact & Pharmacy ?Preferred: [phone] ?Home: [phone] (home) ?Mobile: [phone] (mobile) ?E-mail: No e-mail address on record  ?[place] [place] ?[place] [address] ?Phone: [phone] Fax: [phone] ? ?[place], [place] ?[address] ?[place] [address] ?Phone: [phone] Fax: [phone] ? ?[id number] [place]?Phone: [phone]: [phone]?  ?Pre-screening  ?Anthony Craig offered "in-person" vs "virtual" encounter. He indicated preferring virtual for this encounter.  ? ?Reason ?COVID-19*  Social distancing based on CDC and AMA recommendations.  ? ?I contacted Anthony Humbleson 01/06/2022 via telephone.      I clearly identified myself as BGillis Santa MD. I verified that I was speaking with the correct person using two identifiers (Name: RJsiah Craig and date of birth: [birth year]. ? ?Consent ?I sought verbal advanced consent from Anthony Craig  virtual visit interactions. I informed Anthony Craig of possible security and privacy concerns, risks, and limitations associated with providing "not-in-person" medical evaluation and management services. I also informed Anthony Craig of the availability of "in-person" appointments. Finally, I informed him that there would be a charge for the virtual visit and that he could be  personally, fully or partially, financially responsible for it. Anthony Craig understanding and agreed to proceed.  ? ?Historic Elements   ?Anthony Craig a 88y.o. year old, male patient evaluated today after our last contact on 01/01/2022. Anthony Craig has a past medical history of Cancer ([facility], Coronary artery disease, [place] without complication ([place], Hyperlipidemia, and Hypertension. He also  has a past surgical history that includes Hernia repair (Bilateral); Whipple procedure (2007); Coronary artery bypass graft; Appendectomy; and Biliary drainage. Mr. FDanielsonhas a current medication list which includes the following prescription(s): b-d single use swabs regular, aspirin ec, atorvastatin, b complex vitamins, calcium-vitamin d, cholecalciferol, coq10, diphenoxylate-atropine, fluticasone, glucosamine-chondroitin, hydrocortisone, hydroxypropyl methylcellulose / hypromellose, novolog mix 70/30 flexpen, krill oil, lipase/protease/amylase, multiple vitamin, multiple vitamins-minerals, omega-3 acid ethyl esters, omeprazole, pantoprazole, primidone, sertraline, ursodiol, and vitamin b-12. He  reports that he quit smoking about 42 years ago. His smoking use included cigarettes. He has a 15.00 pack-year smoking history. He has never used smokeless tobacco. He reports current alcohol use. He reports that he does not use drugs. Anthony Craig allergic to doxycycline and hydrocodone bit-homatrop mbr.  ? ?HPI  ?Today, he is being contacted for a post-procedure assessment. ? ? ?Post-procedure evaluation  ? Type:  Lumbar Facet, Medial Branch Radiofrequency Ablation (RFA) #1  ?Laterality: Left  ?Level: L3, L4, L5, Medial Branch Level(s). These levels will denervate the L3-4  and L5-S1 lumbar facet joints.  ?Imaging: Fluoroscopic guidance ?Anesthesia: Local anesthesia (1-2% Lidocaine) ?Anxiolysis: None                 ?Sedation: None. ?DOS: 12/18/2021  ?Performed by: Gillis Santa, MD ? ? Type: Lumbar Facet, Medial Branch Radiofrequency Ablation (RFA) #1  ?Laterality: Right  ?Level: L3, L4, L5, Medial Branch Level(s). These levels will denervate the L3-4 and L5-S1 lumbar facet joints.  ?Imaging: Fluoroscopic guidance ?Anesthesia: Local anesthesia (1-2% Lidocaine) ?Anxiolysis: None                 ?Sedation: None. ?DOS: 12/04/2021  ?Performed by: Gillis Santa, MD ? ? ?Purpose: Therapeutic/Palliative ?Indications: Low back pain severe enough to impact quality of life or function. ?Indications: ?1. Lumbar spondylosis   ?2. Lumbar degenerative disc disease   ?3. Spinal stenosis, lumbar region, with neurogenic claudication   ?4. Chronic pain syndrome   ? ?Anthony Craig has been dealing with the above chronic pain for longer than three months and has either failed to respond, was unable to tolerate, or simply did not get enough benefit from other more conservative therapies including, but not limited to: ?1. Over-the-counter medications ?2. Anti-inflammatory medications ?3. Muscle relaxants ?4. Membrane stabilizers ?5. Opioids ?6. Physical therapy and/or chiropractic manipulation ?7. Modalities (Heat, ice, etc.) ?8. Invasive techniques such as nerve blocks. ?Anthony Craig has attained more than 50% relief of the pain from a series of diagnostic injections conducted in separate occasions. ? ?Pain Score: ?Pre-procedure: 3 /10 ?Post-procedure: 3 /10 ? ?    ?Effectiveness:  ?Initial hour after procedure: 100 %  ?Subsequent 4-6 hours post-procedure: 100 %  ?Analgesia past initial 6 hours: 90 % left, 60% on the right ?Ongoing improvement:   ?Analgesic:  85%-90% on left, 60-65% on the right ?Function: Anthony Craig reports improvement in function ?ROM: Anthony Craig reports improvement in ROM ? ? ?Laboratory Chemistry Profile  ? ?Renal ?Lab Results  ?Component Value Date  ? BUN 22 (A) 08/26/2021  ? CREATININE 1.0 08/26/2021  ? BCR 14 07/16/2020  ? GFRAA 80 07/16/2020  ? GFRNONAA 71 08/26/2021  ?  Hepatic ?Lab Results  ?Component Value Date  ? AST 21 08/26/2021  ? ALT 29 08/26/2021  ? ALBUMIN 3.8 08/26/2021  ? ALKPHOS 123 08/26/2021  ? AMYLASE 50 09/10/2016  ? LIPASE 7 (L) 11/23/2019  ?  ?Electrolytes ?Lab Results  ?Component Value Date  ? NA 135 07/28/2021  ? K 4.9 08/26/2021  ? CL 105 08/26/2021  ? CALCIUM 9.4 08/26/2021  ? MG 1.5 (L) 01/17/2014  ?  Bone ?Lab Results  ?Component Value Date  ? VD25OH 37.9 08/26/2021  ?  ?Inflammation (CRP: Acute Phase) (ESR: Chronic Phase) ?No results found for: CRP, ESRSEDRATE, LATICACIDVEN    ?  ? ?Note: Above Lab results reviewed. ? ?Assessment  ?The primary encounter diagnosis was Lumbar spondylosis. Diagnoses of Lumbar degenerative disc disease, Spinal stenosis, lumbar region, with neurogenic claudication, and Chronic pain syndrome were also pertinent to this visit. ? ?Plan of Care  ? ?Overall patient is doing well from his lumbar facet RFA.  85 to 90% pain relief on the left, 60 to 65% pain relief on the right.  He continues to exercise.  He would like to go back to playing golf. ?He is wondering if it is okay to continue taking Osteo Bi-Flex and acetaminophen which I told him it was so long as he does not exceed a daily acetaminophen dose of 3 g. ? ? ? ?  Follow-up plan:   ?Return in about 4 months (around 05/08/2022) for VV follow up.   ?  ?L4/5 ESI 09/11/21, B/L L3,4,5 MBNB #1 10/14/21 (80% pain relief for the first 2 weeks), 11/13/21 (100% pain relief for the first week), R L3,4,5 RFA 12/04/21, L L3,4,5 RFA 12/18/21 ?   ?  ?Recent Visits ?Date Type Provider Dept  ?12/18/21 Procedure visit Gillis Santa, MD [facility]  ?12/04/21 Procedure visit Gillis Santa, MD [facility]  ?11/27/21 Office Visit Gillis Santa, MD [facility]  ?11/13/21 Procedure visit Gillis Santa, MD [facility]  ?02/0

## 2022-02-10 ENCOUNTER — Ambulatory Visit: Payer: Self-pay

## 2022-02-10 NOTE — Telephone Encounter (Signed)
Reason for Disposition ? [1] Caller has URGENT medicine question about med that PCP or specialist prescribed AND [2] triager unable to answer question ? ?Answer Assessment - Initial Assessment Questions ?1. NAME of MEDICATION: "What medicine are you calling about?" ?    Primidone ?2. QUESTION: "What is your question?" (e.g., double dose of medicine, side effect) ?    Pt noted  ?3. PRESCRIBING HCP: "Who prescribed it?" Reason: if prescribed by specialist, call should be referred to that group. ?    PCP ?4. SYMPTOMS: "Do you have any symptoms?" ?    Pt concerned that Primidone is making him unsteady in the mornings. Would like to talk to [facility] ? ?Protocols used: Medication Question Call-A-AH ? ?

## 2022-02-13 ENCOUNTER — Ambulatory Visit
Payer: No Typology Code available for payment source | Attending: Student in an Organized Health Care Education/Training Program | Admitting: Student in an Organized Health Care Education/Training Program

## 2022-02-13 ENCOUNTER — Encounter: Payer: Self-pay | Admitting: Student in an Organized Health Care Education/Training Program

## 2022-02-13 VITALS — BP 140/82 | HR 65 | Temp 97.8°F | Resp 16 | Ht 71.0 in | Wt 150.0 lb

## 2022-02-13 DIAGNOSIS — G894 Chronic pain syndrome: Secondary | ICD-10-CM

## 2022-02-13 DIAGNOSIS — M5416 Radiculopathy, lumbar region: Secondary | ICD-10-CM

## 2022-02-13 DIAGNOSIS — G8929 Other chronic pain: Secondary | ICD-10-CM | POA: Diagnosis present

## 2022-02-13 DIAGNOSIS — M48062 Spinal stenosis, lumbar region with neurogenic claudication: Secondary | ICD-10-CM | POA: Diagnosis present

## 2022-02-13 NOTE — Progress Notes (Signed)
PROVIDER NOTE: Information contained herein reflects review and annotations entered in association with encounter. Interpretation of such information and data should be left to medically-trained personnel. Information provided to patient can be located elsewhere in the medical record under "Patient Instructions". Document created using STT-dictation technology, any transcriptional errors that may result from process are unintentional.  ?  ?Patient: Anthony Craig  Service Category: E/M  Provider: Gillis Santa, MD  ?DOB: 06/20/1928  DOS: 02/13/2022  Specialty: Interventional Pain Management  ?MRN: [id number]  Setting: Ambulatory outpatient  PCP: Anthony Craig., MD  ?Type: Established Patient    Referring Provider: Jerrol Craig.,*  ?Location: Office  Delivery: Face-to-face    ? ?HPI  ?[name], a 88 y.o. year old male, is here today because of his Lumbar radiculopathy [M54.16]. [name] primary complain today is Back Pain (Lower, right) ?Last encounter: My last encounter with him was on 01/06/2022. ?Pertinent problems: [name] has Lumbar radiculopathy; Spinal stenosis, lumbar region, with neurogenic claudication; Lumbar degenerative disc disease; and Lumbar spondylosis on their pertinent problem list. ?Pain Assessment: Severity of Chronic pain is reported as a 0-No pain/10. Location: Back Right, Lower/NEW raadiating "sensation" down right leg to knee. Onset:  . Quality:  . Timing: Intermittent. Modifying factor(s): stretching, rest. ?Vitals:  height is '5\' 11"'  (1.803 m) and weight is 150 lb (68 kg). His temporal temperature is 97.8 ?F (36.6 ?C). His blood pressure is 140/82 and his pulse is 65. His respiration is 16 and oxygen saturation is 96%.  ? ?Reason for encounter: evaluation of worsening, or previously known (established) problem.  ? ?[name] presents today for low back pain with radiation into his right thigh in a posterior lateral distribution.  This is related to  lumbar foraminal stenosis, lumbar spinal stenosis and lumbar radiculopathy.  He is status post lumbar epidural steroid injection last December on the right at L4-L5 which was helpful for his radiating leg pain.  He is having a similar radicular pain flare and we discussed repeating lumbar ESI since this was helpful previously. ? ?In regards to his axial low back pain, he is doing well after his lumbar radiofrequency ablation therapies.  We will continue to monitor and he is pleased with the results thus far. ? ?He continues to work out and go to [employer] regularly. ? ?We discussed strategies to avoid falls and I encouraged him to utilize a support device or cane if he needs to. ? ?ROS  ?Constitutional: Denies any fever or chills ?Gastrointestinal: No reported hemesis, hematochezia, vomiting, or acute GI distress ?Musculoskeletal:  Low back pain with radiation into right leg in a dermatomal fashion ?Neurological: No reported episodes of acute onset apraxia, aphasia, dysarthria, agnosia, amnesia, paralysis, loss of coordination, or loss of consciousness ? ?Medication Review  ?B-D SINGLE USE SWABS REGULAR, CoQ10, Glucosamine-Chondroitin, Krill Oil, Multiple Vitamin, Multiple Vitamins-Minerals, aspirin EC, atorvastatin, b complex vitamins, calcium-vitamin D, cholecalciferol, diphenoxylate-atropine, fluticasone, hydrocortisone, hydroxypropyl methylcellulose / hypromellose, insulin aspart protamine - aspart, lipase/protease/amylase, omega-3 acid ethyl esters, omeprazole, pantoprazole, primidone, sertraline, ursodiol, and vitamin B-12 ? ?History Review  ?Allergy: [name] is allergic to doxycycline and hydrocodone bit-homatrop mbr. ?Drug: [name]  reports no history of drug use. ?Alcohol:  reports current alcohol use. ?Tobacco:  reports that he quit smoking about 42 years ago. His smoking use included cigarettes. He has a 15.00 pack-year smoking history. He has never used smokeless tobacco. ?Social: [name]   reports that he quit smoking about 21  years ago. His smoking use included cigarettes. He has a 15.00 pack-year smoking history. He has never used smokeless tobacco. He reports current alcohol use. He reports that he does not use drugs. ?Medical:  has a past medical history of Cancer ([facility]), Coronary artery disease, Diabetes mellitus without complication ([place]), Hyperlipidemia, and Hypertension. ?Surgical: [name]  has a past surgical history that includes Hernia repair (Bilateral); Whipple procedure (2007); Coronary artery bypass graft; Appendectomy; and Biliary drainage. ?Family: family history includes COPD in his brother; Cancer (age of onset: 64) in his mother; Diabetes in an other family member; Heart disease in his father and mother; Stroke in his brother. ? ?Laboratory Chemistry Profile  ? ?Renal ?Lab Results  ?Component Value Date  ? BUN 22 (A) 08/26/2021  ? CREATININE 1.0 08/26/2021  ? BCR 14 07/16/2020  ? GFRAA 80 07/16/2020  ? GFRNONAA 71 08/26/2021  ?  Hepatic ?Lab Results  ?Component Value Date  ? AST 21 08/26/2021  ? ALT 29 08/26/2021  ? ALBUMIN 3.8 08/26/2021  ? ALKPHOS 123 08/26/2021  ? AMYLASE 50 09/10/2016  ? LIPASE 7 (L) 11/23/2019  ?  ?Electrolytes ?Lab Results  ?Component Value Date  ? NA 135 07/28/2021  ? K 4.9 08/26/2021  ? CL 105 08/26/2021  ? CALCIUM 9.4 08/26/2021  ? MG 1.5 (L) 01/17/2014  ?  Bone ?Lab Results  ?Component Value Date  ? VD25OH 37.9 08/26/2021  ?  ?Inflammation (CRP: Acute Phase) (ESR: Chronic Phase) ?No results found for: CRP, ESRSEDRATE, LATICACIDVEN    ?  ? ?Note: Above Lab results reviewed. ? ?Recent Imaging Review  ?[facility]-ARM 1-60 MIN NO REPORT ?Fluoro was used, but no Radiologist interpretation will be provided.  ?Please refer to "NOTES" tab for provider progress note. ?Note: Reviewed       ? ?Physical Exam  ?General appearance: Well nourished, well developed, and well hydrated. In no apparent acute distress ?Mental status: Alert, oriented x 3 (person,  place, & time)       ?Respiratory: No evidence of acute respiratory distress ?Eyes: PERLA ?Vitals: BP 140/82 (BP Location: Right Arm, Patient Position: Sitting, Cuff Size: Normal)   Pulse 65   Temp 97.8 ?F (36.6 ?C) (Temporal)   Resp 16   Ht '5\' 11"'  (1.803 m)   Wt 150 lb (68 kg)   SpO2 96%   BMI 20.92 kg/m?  ?BMI: Estimated body mass index is 20.92 kg/m? as calculated from the following: ?  Height as of this encounter: '5\' 11"'  (1.803 m). ?  Weight as of this encounter: 150 lb (68 kg). ?Ideal: Ideal body weight: 75.3 kg (166 lb 0.1 oz) ? ?Lumbar Spine Area Exam  ?Skin & Axial Inspection: No masses, redness, or swelling ?Alignment: Symmetrical ?Functional ROM: Unrestricted ROM       ?Stability: No instability detected ?Muscle Tone/Strength: Functionally intact. No obvious neuro-muscular anomalies detected. ?Sensory (Neurological): Neurogenic pain pattern, dermatomal right L4-L5 ?  ?Gait & Posture Assessment  ?Ambulation: Unassisted ?Gait: Antalgic ?Posture: Difficulty standing up straight, due to pain  ?Lower Extremity Exam      ?Side: Right lower extremity   Side: Left lower extremity  ?Stability: No instability observed           Stability: No instability observed          ?Skin & Extremity Inspection: Skin color, temperature, and hair growth are WNL. No peripheral edema or cyanosis. No masses, redness, swelling, asymmetry, or associated skin lesions. No contractures.   Skin &  Extremity Inspection: Skin color, temperature, and hair growth are WNL. No peripheral edema or cyanosis. No masses, redness, swelling, asymmetry, or associated skin lesions. No contractures.  ?Functional ROM: Pain restricted ROM    , right hip     ?          Functional ROM: Pain restricted ROM         ?         ?Muscle Tone/Strength: Functionally intact. No obvious neuro-muscular anomalies detected.   Muscle Tone/Strength: Functionally intact. No obvious neuro-muscular anomalies detected.  ?Sensory (Neurological): Neurogenic pain pattern,  dermatomal, positive straight leg raise test        Sensory (Neurological): Neurogenic pain pattern        ?DTR: ?Patellar: deferred today ?Achilles: deferred today ?Plantar: deferred today   DTR: ?Patellar: defe

## 2022-02-13 NOTE — Progress Notes (Signed)
Safety precautions to be maintained throughout the outpatient stay will include: orient to surroundings, keep bed in low position, maintain call bell within reach at all times, provide assistance with transfer out of bed and ambulation.  

## 2022-02-13 NOTE — Patient Instructions (Signed)
GENERAL RISKS AND COMPLICATIONS ? ?What are the risk, side effects and possible complications? ?Generally speaking, most procedures are safe.  However, with any procedure there are risks, side effects, and the possibility of complications.  The risks and complications are dependent upon the sites that are lesioned, or the type of nerve block to be performed.  The closer the procedure is to the spine, the more serious the risks are.  Great care is taken when placing the radio frequency needles, block needles or lesioning probes, but sometimes complications can occur. ?Infection: Any time there is an injection through the skin, there is a risk of infection.  This is why sterile conditions are used for these blocks.  There are four possible types of infection. ?Localized skin infection. ?Central Nervous System Infection-This can be in the form of Meningitis, which can be deadly. ?Epidural Infections-This can be in the form of an epidural abscess, which can cause pressure inside of the spine, causing compression of the spinal cord with subsequent paralysis. This would require an emergency surgery to decompress, and there are no guarantees that the patient would recover from the paralysis. ?Discitis-This is an infection of the intervertebral discs.  It occurs in about 1% of discography procedures.  It is difficult to treat and it may lead to surgery. ? ?      2. Pain: the needles have to go through skin and soft tissues, will cause soreness. ?      3. Damage to internal structures:  The nerves to be lesioned may be near blood vessels or   ? other nerves which can be potentially damaged. ?      4. Bleeding: Bleeding is more common if the patient is taking blood thinners such as  aspirin, Coumadin, Ticiid, Plavix, etc., or if he/she have some genetic predisposition  such as hemophilia. Bleeding into the spinal canal can cause compression of the spinal  cord with subsequent paralysis.  This would require an emergency  surgery to  decompress and there are no guarantees that the patient would recover from the  paralysis. ?      5. Pneumothorax:  Puncturing of a lung is a possibility, every time a needle is introduced in  the area of the chest or upper back.  Pneumothorax refers to free air around the  collapsed lung(s), inside of the thoracic cavity (chest cavity).  Another two possible  complications related to a similar event would include: Hemothorax and Chylothorax.   These are variations of the Pneumothorax, where instead of air around the collapsed  lung(s), you may have blood or chyle, respectively. ?      6. Spinal headaches: They may occur with any procedures in the area of the spine. ?      7. Persistent CSF (Cerebro-Spinal Fluid) leakage: This is a rare problem, but may occur  with prolonged intrathecal or epidural catheters either due to the formation of a fistulous  track or a dural tear. ?      8. Nerve damage: By working so close to the spinal cord, there is always a possibility of  nerve damage, which could be as serious as a permanent spinal cord injury with  paralysis. ?      9. Death:  Although rare, severe deadly allergic reactions known as "Anaphylactic  reaction" can occur to any of the medications used. ?     10. Worsening of the symptoms:  We can always make thing worse. ? ?What are the chances  of something like this happening? ?Chances of any of this occuring are extremely low.  By statistics, you have more of a chance of getting killed in a motor vehicle accident: while driving to the hospital than any of the above occurring .  Nevertheless, you should be aware that they are possibilities.  In general, it is similar to taking a shower.  Everybody knows that you can slip, hit your head and get killed.  Does that mean that you should not shower again?  Nevertheless always keep in mind that statistics do not mean anything if you happen to be on the wrong side of them.  Even if a procedure has a 1 (one) in a  1,000,000 (million) chance of going wrong, it you happen to be that one..Also, keep in mind that by statistics, you have more of a chance of having something go wrong when taking medications. ? ?Who should not have this procedure? ?If you are on a blood thinning medication (e.g. Coumadin, Plavix, see list of "Blood Thinners"), or if you have an active infection going on, you should not have the procedure.  If you are taking any blood thinners, please inform your physician. ? ?How should I prepare for this procedure? ?Do not eat or drink anything at least six hours prior to the procedure. ?Bring a driver with you .  It cannot be a taxi. ?Come accompanied by an adult that can drive you back, and that is strong enough to help you if your legs get weak or numb from the local anesthetic. ?Take all of your medicines the morning of the procedure with just enough water to swallow them. ?If you have diabetes, make sure that you are scheduled to have your procedure done first thing in the morning, whenever possible. ?If you have diabetes, take only half of your insulin dose and notify our nurse that you have done so as soon as you arrive at the clinic. ?If you are diabetic, but only take blood sugar pills (oral hypoglycemic), then do not take them on the morning of your procedure.  You may take them after you have had the procedure. ?Do not take aspirin or any aspirin-containing medications, at least eleven (11) days prior to the procedure.  They may prolong bleeding. ?Wear loose fitting clothing that may be easy to take off and that you would not mind if it got stained with Betadine or blood. ?Do not wear any jewelry or perfume ?Remove any nail coloring.  It will interfere with some of our monitoring equipment. ? ?NOTE: Remember that this is not meant to be interpreted as a complete list of all possible complications.  Unforeseen problems may occur. ? ?BLOOD THINNERS ?The following drugs contain aspirin or other products,  which can cause increased bleeding during surgery and should not be taken for 2 weeks prior to and 1 week after surgery.  If you should need take something for relief of minor pain, you may take acetaminophen which is found in Tylenol,m Datril, Anacin-3 and Panadol. It is not blood thinner. The products listed below are.  Do not take any of the products listed below in addition to any listed on your instruction sheet. ? ?A.P.C or A.P.C with Codeine Codeine Phosphate Capsules #3 Ibuprofen Ridaura  ?ABC compound Congesprin Imuran rimadil  ?Advil Cope Indocin Robaxisal  ?Alka-Seltzer Effervescent Pain Reliever and Antacid Coricidin or Coricidin-D ? Indomethacin Rufen  ?Alka-Seltzer plus Cold Medicine Cosprin Ketoprofen S-A-C Tablets  ?Anacin Analgesic Tablets or Capsules Coumadin  Korlgesic Salflex  ?Anacin Extra Strength Analgesic tablets or capsules CP-2 Tablets Lanoril Salicylate  ?Anaprox Cuprimine Capsules Levenox Salocol  ?Anexsia-D Dalteparin Magan Salsalate  ?Anodynos Darvon compound Magnesium Salicylate Sine-off  ?Ansaid Dasin Capsules Magsal Sodium Salicylate  ?Anturane Depen Capsules Marnal Soma  ?APF Arthritis pain formula Dewitt's Pills Measurin Stanback  ?Argesic Dia-Gesic Meclofenamic Sulfinpyrazone  ?Arthritis Bayer Timed Release Aspirin Diclofenac Meclomen Sulindac  ?Arthritis pain formula Anacin Dicumarol Medipren Supac  ?Analgesic (Safety coated) Arthralgen Diffunasal Mefanamic Suprofen  ?Arthritis Strength Bufferin Dihydrocodeine Mepro Compound Suprol  ?Arthropan liquid Dopirydamole Methcarbomol with Aspirin Synalgos  ?ASA tablets/Enseals Disalcid Micrainin Tagament  ?Ascriptin Doan's Midol Talwin  ?Ascriptin A/D Dolene Mobidin Tanderil  ?Ascriptin Extra Strength Dolobid Moblgesic Ticlid  ?Ascriptin with Codeine Doloprin or Doloprin with Codeine Momentum Tolectin  ?Asperbuf Duoprin Mono-gesic Trendar  ?Aspergum Duradyne Motrin or Motrin IB Triminicin  ?Aspirin plain, buffered or enteric coated  Durasal Myochrisine Trigesic  ?Aspirin Suppositories Easprin Nalfon Trillsate  ?Aspirin with Codeine Ecotrin Regular or Extra Strength Naprosyn Uracel  ?Atromid-S Efficin Naproxen Ursinus  ?Auranofin Capsules Elmiro

## 2022-02-19 ENCOUNTER — Ambulatory Visit (HOSPITAL_BASED_OUTPATIENT_CLINIC_OR_DEPARTMENT_OTHER): Payer: Medicare PPO | Admitting: Student in an Organized Health Care Education/Training Program

## 2022-02-19 ENCOUNTER — Encounter: Payer: Self-pay | Admitting: Student in an Organized Health Care Education/Training Program

## 2022-02-19 ENCOUNTER — Ambulatory Visit
Admission: RE | Admit: 2022-02-19 | Discharge: 2022-02-19 | Disposition: A | Discharge status: Home / Self Care | Payer: Medicare PPO | Source: Ambulatory Visit | Attending: Student in an Organized Health Care Education/Training Program | Admitting: Student in an Organized Health Care Education/Training Program

## 2022-02-19 VITALS — BP 148/89 | HR 68 | Temp 97.2°F | Resp 16 | Ht 70.5 in | Wt 150.0 lb

## 2022-02-19 DIAGNOSIS — G894 Chronic pain syndrome: Secondary | ICD-10-CM | POA: Diagnosis not present

## 2022-02-19 DIAGNOSIS — G8929 Other chronic pain: Secondary | ICD-10-CM

## 2022-02-19 DIAGNOSIS — M5416 Radiculopathy, lumbar region: Secondary | ICD-10-CM

## 2022-02-19 DIAGNOSIS — M48062 Spinal stenosis, lumbar region with neurogenic claudication: Secondary | ICD-10-CM | POA: Diagnosis not present

## 2022-02-19 MED ORDER — ROPIVACAINE HCL 2 MG/ML IJ SOLN
INTRAMUSCULAR | Status: AC
  Filled 2022-02-19: qty 20

## 2022-02-19 MED ORDER — DEXAMETHASONE SODIUM PHOSPHATE 10 MG/ML IJ SOLN
INTRAMUSCULAR | Status: AC
  Filled 2022-02-19: qty 1

## 2022-02-19 MED ORDER — SODIUM CHLORIDE 0.9% FLUSH
2.0000 mL | Freq: Once | INTRAVENOUS | Status: AC
  Administered 2022-02-19: 2 mL

## 2022-02-19 MED ORDER — LIDOCAINE HCL 2 % IJ SOLN
INTRAMUSCULAR | Status: AC
Start: 2022-02-19 — End: ?
  Filled 2022-02-19: qty 20

## 2022-02-19 MED ORDER — SODIUM CHLORIDE (PF) 0.9 % IJ SOLN
INTRAMUSCULAR | Status: AC
  Filled 2022-02-19: qty 10

## 2022-02-19 MED ORDER — IOHEXOL 180 MG/ML  SOLN
10.0000 mL | Freq: Once | INTRAMUSCULAR | Status: AC
Start: 2022-02-19 — End: 2022-02-19
  Administered 2022-02-19: 10 mL via EPIDURAL

## 2022-02-19 MED ORDER — LIDOCAINE HCL 2 % IJ SOLN
20.0000 mL | Freq: Once | INTRAMUSCULAR | Status: AC
  Administered 2022-02-19: 100 mg

## 2022-02-19 MED ORDER — DEXAMETHASONE SODIUM PHOSPHATE 10 MG/ML IJ SOLN
10.0000 mg | Freq: Once | INTRAMUSCULAR | Status: AC
  Administered 2022-02-19: 10 mg

## 2022-02-19 MED ORDER — IOHEXOL 180 MG/ML  SOLN
INTRAMUSCULAR | Status: AC
  Filled 2022-02-19: qty 20

## 2022-02-19 MED ORDER — ROPIVACAINE HCL 2 MG/ML IJ SOLN
2.0000 mL | Freq: Once | INTRAMUSCULAR | Status: AC
  Administered 2022-02-19: 2 mL via EPIDURAL

## 2022-02-19 NOTE — Progress Notes (Signed)
Safety precautions to be maintained throughout the outpatient stay will include: orient to surroundings, keep bed in low position, maintain call bell within reach at all times, provide assistance with transfer out of bed and ambulation.  

## 2022-02-19 NOTE — Patient Instructions (Signed)
____________________________________________________________________________________________ ? ?Post-Procedure Discharge Instructions ? ?Instructions: ?Apply ice:  ?Purpose: This will minimize any swelling and discomfort after procedure.  ?When: Day of procedure, as soon as you get home. ?How: Fill a plastic sandwich bag with crushed ice. Cover it with a small towel and apply to injection site. ?How long: (15 min on, 15 min off) Apply for 15 minutes then remove x 15 minutes.  Repeat sequence on day of procedure, until you go to bed. ?Apply heat:  ?Purpose: To treat any soreness and discomfort from the procedure. ?When: Starting the next day after the procedure. ?How: Apply heat to procedure site starting the day following the procedure. ?How long: May continue to repeat daily, until discomfort goes away. ?Food intake: Start with clear liquids (like water) and advance to regular food, as tolerated.  ?Physical activities: Keep activities to a minimum for the first 8 hours after the procedure. After that, then as tolerated. ?Driving: If you have received any sedation, be responsible and do not drive. You are not allowed to drive for 24 hours after having sedation. ?Blood thinner: (Applies only to those taking blood thinners) You may restart your blood thinner 6 hours after your procedure. ?Insulin: (Applies only to Diabetic patients taking insulin) As soon as you can eat, you may resume your normal dosing schedule. ?Infection prevention: Keep procedure site clean and dry. Shower daily and clean area with soap and water. ?Post-procedure Pain Diary: Extremely important that this be done correctly and accurately. Recorded information will be used to determine the next step in treatment. For the purpose of accuracy, follow these rules: ?Evaluate only the area treated. Do not report or include pain from an untreated area. For the purpose of this evaluation, ignore all other areas of pain, except for the treated area. ?After  your procedure, avoid taking a long nap and attempting to complete the pain diary after you wake up. Instead, set your alarm clock to go off every hour, on the hour, for the initial 8 hours after the procedure. Document the duration of the numbing medicine, and the relief you are getting from it. ?Do not go to sleep and attempt to complete it later. It will not be accurate. If you received sedation, it is likely that you were given a medication that may cause amnesia. Because of this, completing the diary at a later time may cause the information to be inaccurate. This information is needed to plan your care. ?Follow-up appointment: Keep your post-procedure follow-up evaluation appointment after the procedure (usually 2 weeks for most procedures, 6 weeks for radiofrequencies). DO NOT FORGET to bring you pain diary with you.  ? ?Expect: (What should I expect to see with my procedure?) ?From numbing medicine (AKA: Local Anesthetics): Numbness or decrease in pain. You may also experience some weakness, which if present, could last for the duration of the local anesthetic. ?Onset: Full effect within 15 minutes of injected. ?Duration: It will depend on the type of local anesthetic used. On the average, 1 to 8 hours.  ?From steroids (Applies only if steroids were used): Decrease in swelling or inflammation. Once inflammation is improved, relief of the pain will follow. ?Onset of benefits: Depends on the amount of swelling present. The more swelling, the longer it will take for the benefits to be seen. In some cases, up to 10 days. ?Duration: Steroids will stay in the system x 2 weeks. Duration of benefits will depend on multiple posibilities including persistent irritating factors. ?Side-effects: If present, they  may typically last 2 weeks (the duration of the steroids). ?Frequent: Cramps (if they occur, drink Gatorade and take over-the-counter Magnesium 450-500 mg once to twice a day); water retention with temporary  weight gain; increases in blood sugar; decreased immune system response; increased appetite. ?Occasional: Facial flushing (red, warm cheeks); mood swings; menstrual changes. ?Uncommon: Long-term decrease or suppression of natural hormones; bone thinning. (These are more common with higher doses or more frequent use. This is why we prefer that our patients avoid having any injection therapies in other practices.)  ?Very Rare: Severe mood changes; psychosis; aseptic necrosis. ?From procedure: Some discomfort is to be expected once the numbing medicine wears off. This should be minimal if ice and heat are applied as instructed. ? ?Call if: (When should I call?) ?You experience numbness and weakness that gets worse with time, as opposed to wearing off. ?New onset bowel or bladder incontinence. (Applies only to procedures done in the spine) ? ?Emergency Numbers: ?[place] business hours (Monday - Thursday, 8:00 AM - 4:00 PM) (Friday, 9:00 AM - 12:00 Noon): [phone] ?After hours: [phone] ?NOTE: If you are having a problem and are unable connect with, or to talk to a provider, then go to your nearest urgent care or emergency department. If the problem is serious and urgent, please call 911. ?____________________________________________________________________________________________ ? Epidural Steroid Injection ?Patient Information ? ?Description: The epidural space surrounds the nerves as they exit the spinal cord.  In some patients, the nerves can be compressed and inflamed by a bulging disc or a tight spinal canal (spinal stenosis).  By injecting steroids into the epidural space, we can bring irritated nerves into direct contact with a potentially helpful medication.  These steroids act directly on the irritated nerves and can reduce swelling and inflammation which often leads to decreased pain.  Epidural steroids may be injected anywhere along the spine and from the neck to the low back depending upon the  location of your pain. ?  After numbing the skin with local anesthetic (like Novocaine), a small needle is passed into the epidural space slowly.  You may experience a sensation of pressure while this is being done.  The entire block usually last less than 10 minutes. ? ?Conditions which may be treated by epidural steroids: ? ?Low back and leg pain ?Neck and arm pain ?Spinal stenosis ?Post-laminectomy syndrome ?Herpes zoster (shingles) pain ?Pain from compression fractures ? ?Preparation for the injection: ? ?Do not eat any solid food or dairy products within 8 hours of your appointment.  ?You may drink clear liquids up to 3 hours before appointment.  Clear liquids include water, black coffee, juice or soda.  No milk or cream please. ?You may take your regular medication, including pain medications, with a sip of water before your appointment  Diabetics should hold regular insulin (if taken separately) and take 1/2 normal NPH dos the morning of the procedure.  Carry some sugar containing items with you to your appointment. ?A driver must accompany you and be prepared to drive you home after your procedure.  ?Bring all your current medications with your. ?An IV may be inserted and sedation may be given at the discretion of the physician.   ?A blood pressure cuff, EKG and other monitors will often be applied during the procedure.  Some patients may need to have extra oxygen administered for a short period. ?You will be asked to provide medical information, including your allergies, prior to the procedure.  We must know immediately  if you are taking blood thinners (like Coumadin/Warfarin)  Or if you are allergic to IV iodine contrast (dye). We must know if you could possible be pregnant. ? ?Possible side-effects: ?Bleeding from needle site ?Infection (rare, may require surgery) ?Nerve injury (rare) ?Numbness & tingling (temporary) ?Difficulty urinating (rare, temporary) ?Spinal headache ( a headache worse with upright  posture) ?Light -headedness (temporary) ?Pain at injection site (several days) ?Decreased blood pressure (temporary) ?Weakness in arm/leg (temporary) ?Pressure sensation in back/neck (temporary) ? ?Call i

## 2022-02-19 NOTE — Progress Notes (Signed)
PROVIDER NOTE: Information contained herein reflects review and annotations entered in association with encounter. Interpretation of such information and data should be left to medically-trained personnel. Information provided to patient can be located elsewhere in the medical record under "Patient Instructions". Document created using STT-dictation technology, any transcriptional errors that may result from process are unintentional.  ?  ?Patient: Anthony Craig  Service Category: Procedure Provider: Gillis Santa, MD ?DOB: 08-Mar-1928 DOS: 02/19/2022 Location: [facility] ?MRN: [id number] Setting: Ambulatory - outpatient Referring Provider: Jerrol Banana.,* ?Type: Established Patient Specialty: Interventional Pain Management PCP: Jerrol Banana., MD ? ?Primary Reason for Visit: Interventional Pain Management Treatment. ?CC: Back Pain ? ?  ?Procedure:          Anesthesia, Analgesia, Anxiolysis:  ?Type: Therapeutic Inter-Laminar Epidural Steroid Injection  #2  ?Region: Lumbar ?Level: L5-S1 Level. ?Laterality: Right-Sided         Anesthesia: Local (1-2% Lidocaine)  ?Anxiolysis: None  ?Sedation: None  ?Guidance: Fluoroscopy         ? ? ?Position: Prone with head of the table was raised to facilitate breathing.  ? ?Indications: ?1. Lumbar radiculopathy   ?2. Chronic radicular lumbar pain   ?3. Spinal stenosis, lumbar region, with neurogenic claudication   ?4. Chronic pain syndrome   ? ?Pain Score: ?Pre-procedure: 3 /10 ?Post-procedure: 0-No pain/10  ?  ?Pre-op H&P Assessment:  ?Anthony Craig is a 88 y.o. (year old), male patient, seen today for interventional treatment. He  has a past surgical history that includes Hernia repair (Bilateral); Whipple procedure (2007); Coronary artery bypass graft; Appendectomy; and Biliary drainage. Anthony Craig has a current medication list which includes the following prescription(s): b-d single use swabs regular, aspirin ec, atorvastatin, b complex  vitamins, calcium-vitamin d, cholecalciferol, coq10, diphenoxylate-atropine, fluticasone, glucosamine-chondroitin, hydrocortisone, hydroxypropyl methylcellulose / hypromellose, novolog mix 70/30 flexpen, krill oil, lipase/protease/amylase, multiple vitamin, multiple vitamins-minerals, omega-3 acid ethyl esters, omeprazole, pantoprazole, sertraline, ursodiol, vitamin b-12, and primidone. His primarily concern today is the Back Pain ? ? ?Initial Vital Signs:  ?Pulse/HCG Rate: 68ECG Heart Rate: 83 ?Temp: (!) 97.2 ?F (36.2 ?C) ?Resp: 16 ?BP: (!) 141/59 ?SpO2: 96 % ? ?BMI: Estimated body mass index is 21.22 kg/m? as calculated from the following: ?  Height as of this encounter: 5' 10.5" (1.791 m). ?  Weight as of this encounter: 150 lb (68 kg). ? ?Risk Assessment: ?Allergies: Reviewed. He is allergic to doxycycline and hydrocodone bit-homatrop mbr.  ?Allergy Precautions: None required ?Coagulopathies: Reviewed. None identified.  ?Blood-thinner therapy: None at this time ?Active Infection(s): Reviewed. None identified. Anthony Craig is afebrile ? ?Site Confirmation: Anthony Craig was asked to confirm the procedure and laterality before marking the site ?Procedure checklist: Completed ?Consent: Before the procedure and under the influence of no sedative(s), amnesic(s), or anxiolytics, the patient was informed of the treatment options, risks and possible complications. To fulfill our ethical and legal obligations, as recommended by the American Medical Association's Code of Ethics, I have informed the patient of my clinical impression; the nature and purpose of the treatment or procedure; the risks, benefits, and possible complications of the intervention; the alternatives, including doing nothing; the risk(s) and benefit(s) of the alternative treatment(s) or procedure(s); and the risk(s) and benefit(s) of doing nothing. ?The patient was provided information about the general risks and possible complications associated with  the procedure. These may include, but are not limited to: failure to achieve desired goals, infection, bleeding, organ or nerve damage, allergic reactions, paralysis, and death. ?  In addition, the patient was informed of those risks and complications associated to Spine-related procedures, such as failure to decrease pain; infection (i.e.: Meningitis, epidural or intraspinal abscess); bleeding (i.e.: epidural hematoma, subarachnoid hemorrhage, or any other type of intraspinal or peri-dural bleeding); organ or nerve damage (i.e.: Any type of peripheral nerve, nerve root, or spinal cord injury) with subsequent damage to sensory, motor, and/or autonomic systems, resulting in permanent pain, numbness, and/or weakness of one or several areas of the body; allergic reactions; (i.e.: anaphylactic reaction); and/or death. ?Furthermore, the patient was informed of those risks and complications associated with the medications. These include, but are not limited to: allergic reactions (i.e.: anaphylactic or anaphylactoid reaction(s)); adrenal axis suppression; blood sugar elevation that in diabetics may result in ketoacidosis or comma; water retention that in patients with history of congestive heart failure may result in shortness of breath, pulmonary edema, and decompensation with resultant heart failure; weight gain; swelling or edema; medication-induced neural toxicity; particulate matter embolism and blood vessel occlusion with resultant organ, and/or nervous system infarction; and/or aseptic necrosis of one or more joints. ?Finally, the patient was informed that Medicine is not an exact science; therefore, there is also the possibility of unforeseen or unpredictable risks and/or possible complications that may result in a catastrophic outcome. The patient indicated having understood very clearly. We have given the patient no guarantees and we have made no promises. Enough time was given to the patient to ask questions, all  of which were answered to the patient's satisfaction. Anthony Craig has indicated that he wanted to continue with the procedure. ?Attestation: I, the ordering provider, attest that I have discussed with the patient the benefits, risks, side-effects, alternatives, likelihood of achieving goals, and potential problems during recovery for the procedure that I have provided informed consent. ?Date  Time: 02/19/2022 11:25 AM ? ?Pre-Procedure Preparation:  ?Monitoring: As per clinic protocol. Respiration, ETCO2, SpO2, BP, heart rate and rhythm monitor placed and checked for adequate function ?Safety Precautions: Patient was assessed for positional comfort and pressure points before starting the procedure. ?Time-out: I initiated and conducted the "Time-out" before starting the procedure, as per protocol. The patient was asked to participate by confirming the accuracy of the "Time Out" information. Verification of the correct person, site, and procedure were performed and confirmed by me, the nursing staff, and the patient. "Time-out" conducted as per Joint Commission's Universal Protocol (UP.01.01.01). ?Time: 1215 ? ?Description of Procedure:          ?Target Area: The interlaminar space, initially targeting the lower laminar border of the superior vertebral body. ?Approach: Paramedial approach. ?Area Prepped: Entire Posterior Lumbar Region ?DuraPrep (Iodine Povacrylex [0.7% available iodine] and Isopropyl Alcohol, 74% w/w) ?Safety Precautions: Aspiration looking for blood return was conducted prior to all injections. At no point did we inject any substances, as a needle was being advanced. No attempts were made at seeking any paresthesias. Safe injection practices and needle disposal techniques used. Medications properly checked for expiration dates. SDV (single dose vial) medications used. ?Description of the Procedure: Protocol guidelines were followed. The procedure needle was introduced through the skin, ipsilateral to  the reported pain, and advanced to the target area. Bone was contacted and the needle walked caudad, until the lamina was cleared. The epidural space was identified using ?loss-of-resistance technique?

## 2022-02-20 ENCOUNTER — Telehealth: Payer: Self-pay | Admitting: *Deleted

## 2022-02-20 NOTE — Telephone Encounter (Signed)
Called for post procedure check. No answer. LVM. 

## 2022-03-02 IMAGING — CR DG CHEST 2V
2 series · 2 of 2 positions shown · non-contrast
Comparison: Chest x-ray 10/17/2019

CLINICAL DATA: Chest pain

EXAM:
CHEST - 2 VIEW

[chest pa]
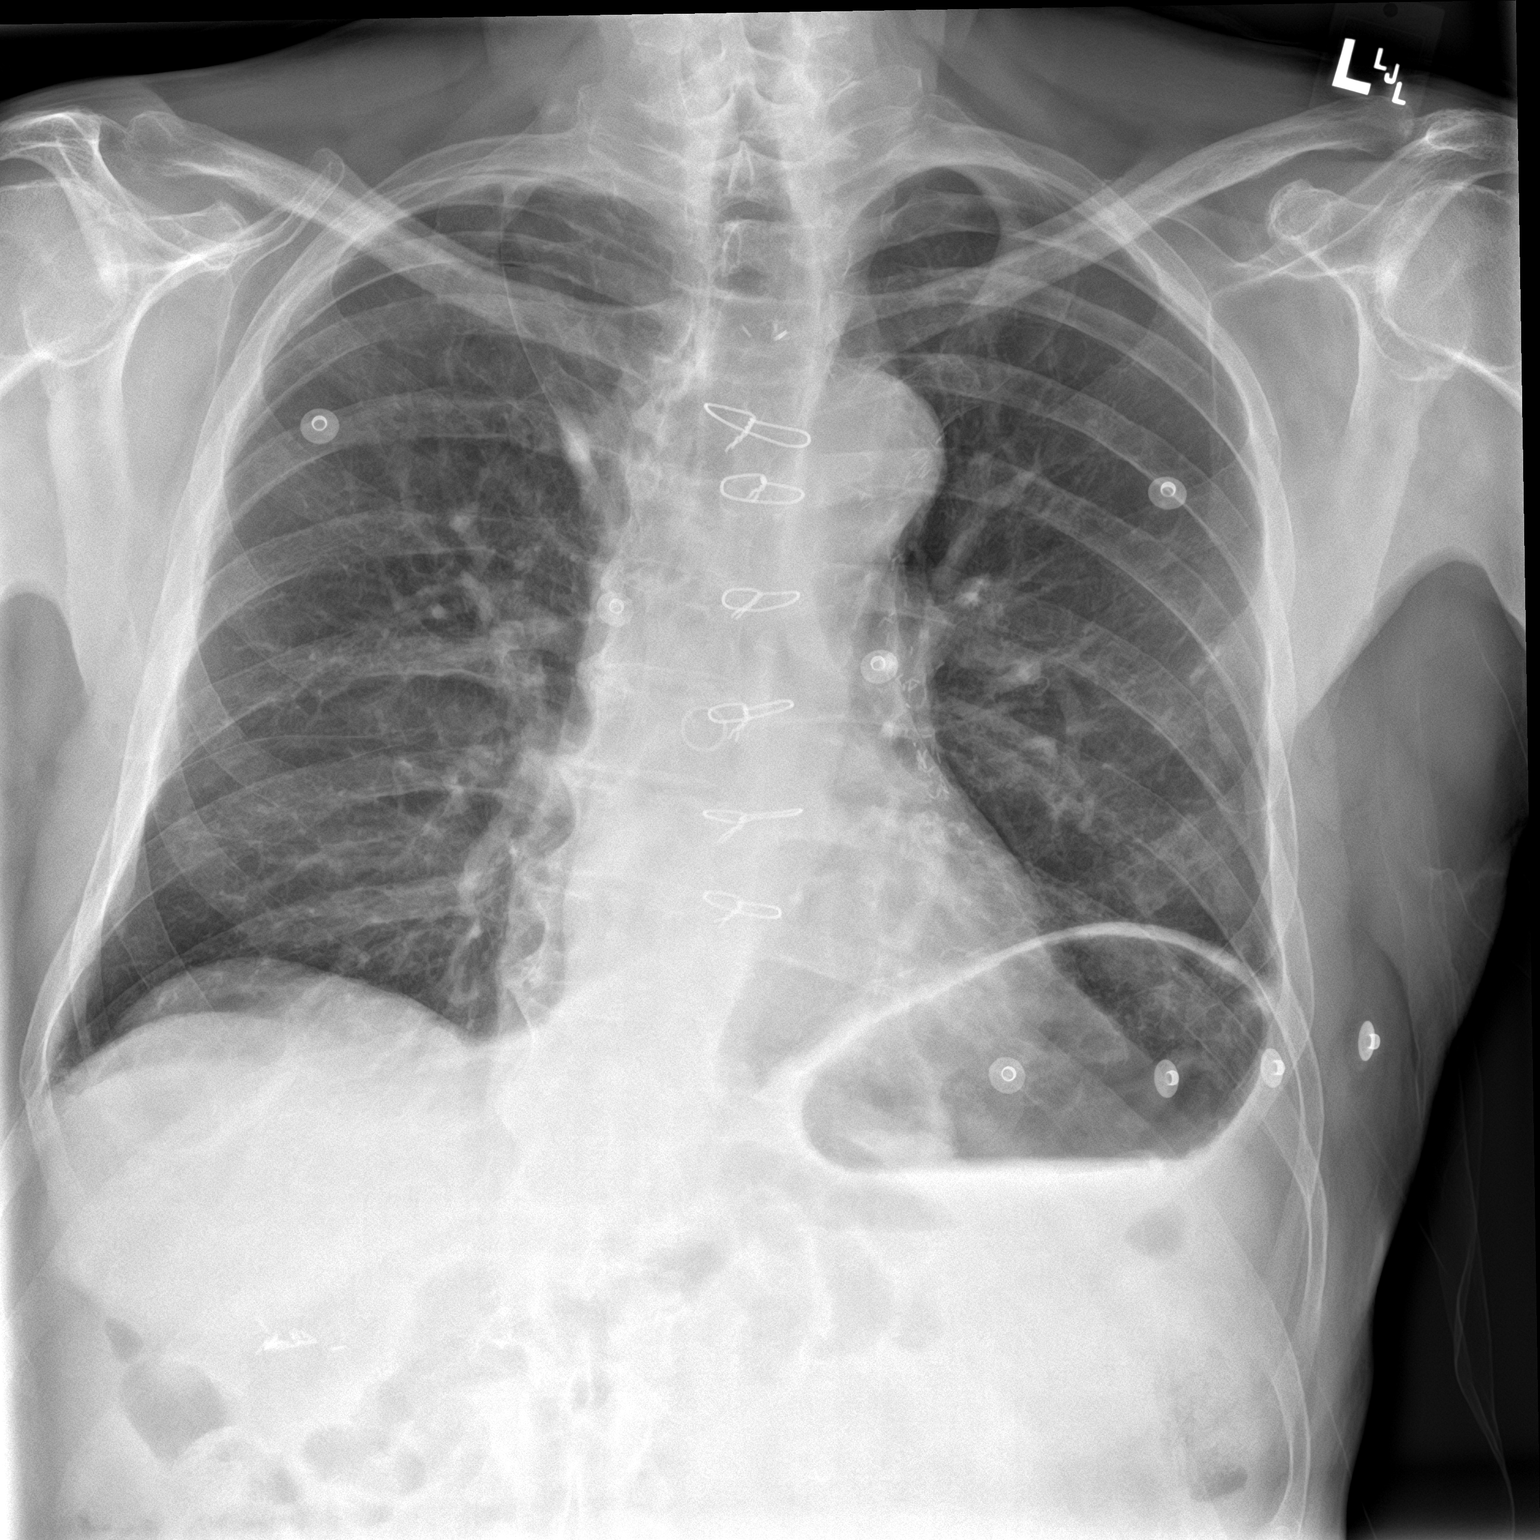

[chest lat]
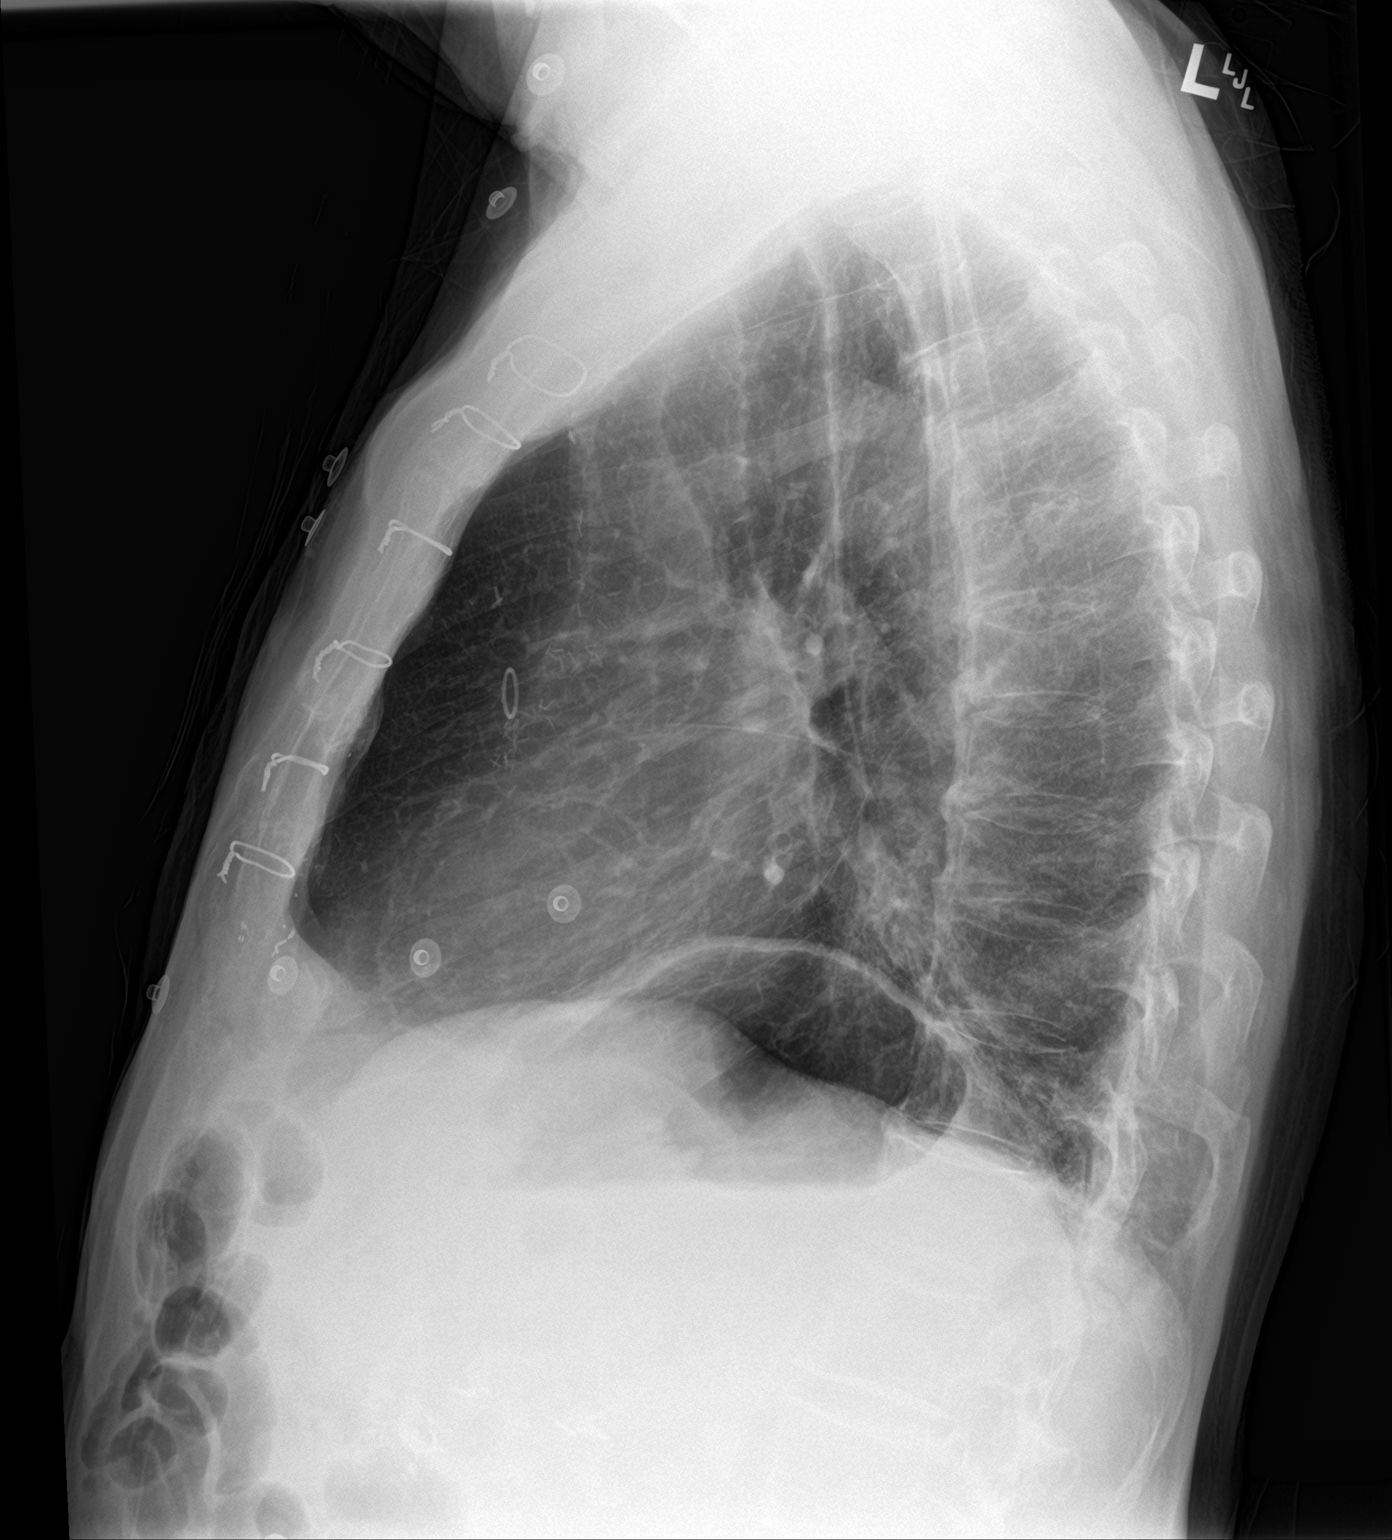

[2 of 2 positions shown; findings below may reference images not displayed]

FINDINGS: Cardiac surgical changes overlie the mediastinum.

The heart and mediastinal contours are within normal limits.

Incidentally noted azygous fissure. No focal consolidation. No
pulmonary edema. No pleural effusion. No pneumothorax.

No acute osseous abnormality.
IMPRESSION: No active cardiopulmonary disease.

## 2022-03-07 ENCOUNTER — Telehealth: Payer: Self-pay

## 2022-03-07 NOTE — Progress Notes (Signed)
Chronic Care Management Pharmacy Assistant   Name: Lindsey Hommel  MRN: [id number] DOB: 05/19/1928  Reason for Encounter:Diabetes Disease State Call.   Recent office visits:  12/16/2021 Dr. Rosanna Randy MD (PCP) No Medication Changes noted, Return in about 6 months  12/11/2021 Kirke Shaggy LPN (PCP Office) Medicare Wellness completed  Recent consult visits:  02/19/2022 Dr. Holley Raring MD (Pain Medicine) No Medication Changes noted 02/13/2022 Dr. Holley Raring MD (Pain Medicine) No Medication Changes noted 02/05/2022 Dr. Phill Myron MD (Dermatology) No Medication Changes noted, return in 6 months 01/06/2022 Dr. Holley Raring MD (Pain Medicine) No Medication Changes noted 01/01/2022 Dr. Holley Raring MD (Pain Medicine) No Medication Changes noted 12/18/2021 Dr. Holley Raring MD (Pain Medicine) No Medication Changes noted 12/04/2021 Dr. Holley Raring MD (Pain Medicine) No Medication Changes noted 11/27/2021 Dr. Holley Raring MD (Pain Medicine) No Medication Changes noted  Hospital visits:  None in previous 6 months  Medications: Outpatient Encounter Medications as of 03/07/2022  Medication Sig   Alcohol Swabs (B-D SINGLE USE SWABS REGULAR) PADS USE AS DIRECTED  WITH  EACH  FINGERSTICK   aspirin EC 81 MG tablet Take 81 mg by mouth daily.   atorvastatin (LIPITOR) 40 MG tablet TAKE 1 TABLET BY MOUTH EVERYDAY AT BEDTIME (Patient taking differently: 20 mg.)   b complex vitamins tablet Take 1 tablet by mouth daily.   calcium-vitamin D 250-100 MG-UNIT per tablet Take 1 tablet by mouth daily.   cholecalciferol (VITAMIN D) 1000 UNITS tablet Take 1,000 Units by mouth daily.   Coenzyme Q10 (COQ10) 200 MG CAPS Take 100 mg by mouth daily.    diphenoxylate-atropine (LOMOTIL) 2.5-0.025 MG per tablet Take 1 tablet by mouth 4 (four) times daily as needed for diarrhea or loose stools.   fluticasone (FLONASE) 50 MCG/ACT nasal spray Place 2 sprays into both nostrils daily.   Glucosamine-Chondroitin (OSTEO BI-FLEX REGULAR STRENGTH PO) Take 1 tablet  by mouth in the morning and at bedtime.   hydrocortisone 2.5 % lotion Apply 1 application topically 2 (two) times daily. To affected areas of face only as needed for flares of itching/scaling    hydroxypropyl methylcellulose / hypromellose (ISOPTO TEARS / GONIOVISC) 2.5 % ophthalmic solution Place 1 drop into both eyes 4 (four) times daily as needed for dry eyes.   insulin aspart protamine - aspart (NOVOLOG MIX 70/30 FLEXPEN) (70-30) 100 UNIT/ML FlexPen 12 units in the am and 6 units after dinner.   Krill Oil 500 MG CAPS Take 1 capsule by mouth daily. [place]   lipase/protease/amylase (CREON) 36000 UNITS CPEP capsule Take 36,000 Units by mouth 3 (three) times daily with meals.   Multiple Vitamins-Minerals (MULTIVITAMIN PO) Take 1 tablet by mouth daily.   Multiple Vitamins-Minerals (PRESERVISION AREDS PO) Take by mouth.    omega-3 acid ethyl esters (LOVAZA) 1 g capsule Take by mouth 2 (two) times daily.    omeprazole (PRILOSEC) 20 MG capsule Take 20 mg by mouth 2 (two) times daily.   pantoprazole (PROTONIX) 40 MG tablet TAKE 1 TABLET BY MOUTH EVERY DAY   primidone (MYSOLINE) 50 MG tablet Take 1 tablet (50 mg total) by mouth 4 (four) times daily. (Patient not taking: Reported on 02/13/2022)   sertraline (ZOLOFT) 25 MG tablet Take 1 tablet (25 mg total) by mouth daily.   ursodiol (ACTIGALL) 300 MG capsule Take 300 mg by mouth 2 (two) times daily.   vitamin B-12 (CYANOCOBALAMIN) 1000 MCG tablet Take 5,000 mcg by mouth daily.    No facility-administered encounter medications on file as of 03/07/2022.  Care Gaps: FOOT EXAM URINE MICROALBUMIN COVID-19 Vaccine Booster 4 OPHTHALMOLOGY EXAM (last completed 04/24/2020)     Star Rating Drugs: Atorvastatin 40 mg last filled on 10/11/2019 for a 90-Day supply with [facility] (patient also does go to the [place] so it's possible he is getting his prescriptions from there pharmacy)  Recent Relevant Labs: Lab Results  Component Value Date/Time   HGBA1C  7.7 (A) 12/16/2021 02:28 PM   HGBA1C 8.0 08/26/2021 12:00 AM   HGBA1C 8.2 (A) 12/10/2020 09:41 AM   HGBA1C 8.4 (H) 07/16/2020 09:15 AM   HGBA1C 7.6 (H) 11/23/2019 03:52 PM   HGBA1C 7.4 03/09/2017 12:00 AM   MICROALBUR 0.782 02/18/2021 12:00 AM    Kidney Function Lab Results  Component Value Date/Time   CREATININE 1.0 08/26/2021 12:00 AM   CREATININE 0.83 07/28/2021 04:13 PM   CREATININE 0.96 07/16/2020 09:15 AM   CREATININE 1.10 01/17/2014 04:13 AM   CREATININE 1.04 01/16/2014 02:01 AM   GFRNONAA 71 08/26/2021 12:00 AM   GFRNONAA >60 07/28/2021 04:13 PM   GFRNONAA >60 01/17/2014 04:13 AM   GFRAA 80 07/16/2020 09:15 AM   GFRAA >60 01/17/2014 04:13 AM    Current antihyperglycemic regimen:  Novolog 70/30 12 units AM, 6 units PM, will give extra 4 units if sugars are elevated.   What recent interventions/DTPs have been made to improve glycemic control:  None ID  Have there been any recent hospitalizations or ED visits since last visit with CPP? No  Patient denies hypoglycemic symptoms, including Pale, Sweaty, Shaky, Hungry, Nervous/irritable, and Vision changes  Patient denies hyperglycemic symptoms, including blurry vision, excessive thirst, fatigue, polyuria, and weakness  How often are you checking your blood sugar?  Use Dexcom What are your blood sugars ranging?  Patient states his blood sugars ranges around 100's - 200's. Patient states his blood sugar is 165 while on the phone with me because he just ate a big breakfast.Patient reports his blood sugar did get up to 400 after going to the gym, but came back down in a few hours (only happen once).Patient states the lowest his blood sugar gotten is 85.   During the week, how often does your blood glucose drop below 70? Never  Are you checking your feet daily/regularly?   Yes, patient denies numbness,pain, or tingling sensations in his feet.   Adherence Review: Is the patient currently on a STATIN medication? Yes Is the  patient currently on ACE/ARB medication? No Does the patient have >5 day gap between last estimated fill dates? Yes  Office follow up appointment with Care management team member scheduled for : 04/29/2022 at 3:45 pm.  [place] Pharmacist Assistant [phone]

## 2022-03-25 ENCOUNTER — Ambulatory Visit (HOSPITAL_BASED_OUTPATIENT_CLINIC_OR_DEPARTMENT_OTHER)
Payer: No Typology Code available for payment source | Admitting: Student in an Organized Health Care Education/Training Program

## 2022-03-25 DIAGNOSIS — M5416 Radiculopathy, lumbar region: Secondary | ICD-10-CM

## 2022-04-01 NOTE — Progress Notes (Signed)
No appt 

## 2022-04-03 ENCOUNTER — Telehealth
Payer: No Typology Code available for payment source | Admitting: Student in an Organized Health Care Education/Training Program

## 2022-04-17 ENCOUNTER — Ambulatory Visit
Payer: No Typology Code available for payment source | Attending: Student in an Organized Health Care Education/Training Program | Admitting: Student in an Organized Health Care Education/Training Program

## 2022-04-17 ENCOUNTER — Encounter: Payer: Self-pay | Admitting: Student in an Organized Health Care Education/Training Program

## 2022-04-17 DIAGNOSIS — M5416 Radiculopathy, lumbar region: Secondary | ICD-10-CM | POA: Diagnosis not present

## 2022-04-17 DIAGNOSIS — M5136 Other intervertebral disc degeneration, lumbar region: Secondary | ICD-10-CM | POA: Diagnosis not present

## 2022-04-17 DIAGNOSIS — M47816 Spondylosis without myelopathy or radiculopathy, lumbar region: Secondary | ICD-10-CM

## 2022-04-17 DIAGNOSIS — M48062 Spinal stenosis, lumbar region with neurogenic claudication: Secondary | ICD-10-CM

## 2022-04-17 DIAGNOSIS — G8929 Other chronic pain: Secondary | ICD-10-CM

## 2022-04-17 NOTE — Progress Notes (Signed)
Patient: Anthony Craig  Service Category: E/M  Provider: Gillis Santa, MD  DOB: [birth year]  DOS: 04/17/2022  Location: Office  MRN: [id number]  Setting: Ambulatory outpatient  Referring Provider: Jerrol Craig.,*  Type: Established Patient  Specialty: Interventional Pain Management  PCP: Anthony Craig., MD  Location: Remote location  Delivery: TeleHealth     Virtual Encounter - Pain Management PROVIDER NOTE: Information contained herein reflects review an  d annotations entered in association with encounter. Interpretation of such information and data should be left to medically-trained personnel. Information provided to patient can be located elsewhere in the medical record under "Patient Instructions". Document created using STT-dictation technology, any transcriptional errors that may result from process are unintentional.    Contact & Pharmacy Preferred: [phone] Home: [phone] (home) Mobile: [phone] (mobile) E-mail: No e-mail address on record  [place], [place] - [address] [place] [address] Phone: [phone] Fax: [place], [place] [address] [place] [address] Phone: [phone] Fax: [phone]  [id number]: [phone]: [phone]  Pre-screening  Anthony Craig offered "in-person" vs "virtual" encounter. He indicated preferring virtual for this encounter.   Reason COVID-19*  Social distancing based on CDC and AMA recommendations.   I contacted RTiburcio Craig on 04/17/2022 via telephone.      I clearly identified myself as BGillis Santa MD. I verified that I was speaking with the correct person using two identifiers (Name: Anthony Craig and date of birth: Anthony Craig.  Consent I sought verbal advanced consent from Anthony Craig for virtual visit interactions. I informed Anthony Craig of possible security and privacy concerns, risks, and limitations associated with providing "not-in-person" medical evaluation and management services. I also informed Anthony Craig of the availability of "in-person" appointments. Finally, I informed him that there would be a charge for the virtual visit and that he could be  personally, fully or partially, financially responsible for it. Mr. FRuhlandexpressed understanding and agreed to proceed.   Historic Elements   Mr. RDuanne Duchesneis a 88y.o. year old, male patient evaluated today after our last contact on 02/19/2022. Anthony Craig has a past medical history of Cancer ([facility], Coronary artery disease, Diabetes mellitus without complication ([place], Hyperlipidemia, and Hypertension. He also  has a past surgical history that includes Hernia repair (Bilateral); Whipple procedure (2007); Coronary artery bypass graft; Appendectomy; and Biliary drainage. Anthony Craig a current medication list which includes the following prescription(s): b-d single use swabs regular, aspirin ec, atorvastatin, b complex vitamins, calcium-vitamin d, cholecalciferol, coq10, diphenoxylate-atropine, fluticasone, glucosamine-chondroitin, hydrocortisone, hydroxypropyl methylcellulose / hypromellose, novolog mix 70/30 flexpen, krill oil, lipase/protease/amylase, multiple vitamin, multiple vitamins-minerals, omega-3 acid ethyl esters, omeprazole, pantoprazole, sertraline, ursodiol, vitamin b-12, and primidone. He  reports that he quit smoking about 42 years ago. His smoking use included cigarettes. He has a 15.00 pack-year smoking history. He has never used smokeless tobacco. He reports current alcohol use. He reports that he does not use drugs. Anthony Craig allergic to doxycycline and hydrocodone bit-homatrop mbr.   HPI  Today, he is being contacted for a post-procedure assessment.  Radiating Post-procedure evaluation     Procedure:          Anesthesia, Analgesia, Anxiolysis:  Type: Therapeutic Inter-Laminar Epidural Steroid Injection  #2  Region: Lumbar Level: L5-S1 Level. Laterality: Right-Sided         Anesthesia: Local (1-2% Lidocaine)  Anxiolysis: None  Sedation: None  Guidance: Fluoroscopy           Position: Prone with head of the table was raised to facilitate breathing.   Indications: 1. Lumbar radiculopathy   2. Chronic radicular lumbar pain   3. Spinal stenosis, lumbar region, with neurogenic claudication   4. Chronic pain syndrome    Pain Score: Pre-procedure: 3 /10 Post-procedure: 0-No pain/10     Effectiveness:  Initial hour after procedure: 100 %  Subsequent 4-6 hours post-procedure: 100 %  Analgesia past initial 6 hours: 100% for 1 week then pain started to return Analgesic:  25-30% Function: Back to baseline ROM: Back to baseline   Laboratory Chemistry Profile   Renal Lab Results  Component Value Date   BUN 22 (A) 08/26/2021   CREATININE 1.0 08/26/2021   BCR 14 07/16/2020   GFRAA 80 07/16/2020   GFRNONAA 71 08/26/2021    Hepatic Lab Results  Component Value Date   AST 21 08/26/2021   ALT 29 08/26/2021   ALBUMIN 3.8 08/26/2021   ALKPHOS 123 08/26/2021   AMYLASE 50 09/10/2016   LIPASE 7 (L) 11/23/2019    Electrolytes Lab Results  Component Value Date   NA 135 07/28/2021   K 4.9 08/26/2021   CL 105 08/26/2021   CALCIUM 9.4 08/26/2021   MG 1.5 (L) 01/17/2014    Bone Lab Results  Component Value Date   VD25OH 37.9 08/26/2021    Inflammation (CRP: Acute Phase) (ESR: Chronic Phase) No results found for: "CRP", "ESRSEDRATE", "LATICACIDVEN"       Note: Above Lab results reviewed.   Assessment  The primary encounter diagnosis was Lumbar radiculopathy. Diagnoses of Chronic radicular lumbar pain, Spinal stenosis, lumbar region, with neurogenic claudication, Lumbar spondylosis, and Lumbar degenerative disc disease were also pertinent to this  visit.  Plan of Care  Repeat lumbar ESI after August 17 which would be 3 months from his previous one. Reviewed exercises that he could employ at the gym to help with his lower back conditioning.  Orders:  Orders Placed This Encounter  Procedures   Lumbar Epidural Injection    Standing Status:   Future    Standing Expiration Date:   10/18/2022    Scheduling Instructions:     Right L5/S1 ESI    Order Specific Question:   Where will this procedure be performed?    Answer:   [facility]   Follow-up plan:   Return in about 6 weeks (around 05/26/2022) for R L5/S1 ESI, in clinic NS.     L4/5 ESI 09/11/21, R L5/S1 02/19/22    Recent Visits Date Type Provider Dept  02/19/22 Procedure visit Gillis Santa, MD [facility]  02/13/22 Office Visit Gillis Santa, MD [facility]  Showing recent visits within past 90 days and meeting all other requirements Today's Visits Date Type Provider Dept  04/17/22 Office Visit Gillis Santa, MD [facility]  Showing today's visits and meeting all other requirements Future Appointments No visits were found meeting these conditions. Showing future appointments within next 90 days and meeting all other requirements  I discussed the assessment and treatment plan with the patient. The patient was provided an opportunity to ask questions and all were answered. The patient agreed with the plan and demonstrated an understanding of the instructions.  Patient advised to call back or seek an in-person  evaluation if the symptoms or condition worsens.  Duration of encounter: 20mnutes.  Note by: BGillis Santa MD Date: 04/17/2022; Time: 2:40 PM

## 2022-04-28 ENCOUNTER — Telehealth: Payer: Self-pay

## 2022-04-28 NOTE — Progress Notes (Signed)
Chronic Care Management APPOINTMENT REMINDER   Called Artavious Trebilcock [place], No answer, left message of appointment on 04/29/2022 at 3:45 pm via office visit with [name] , Pharm D. Notified to have all medications, supplements, blood pressure and/or blood sugar logs available during appointment and to return call if need to reschedule.   [place] Pharmacist Assistant [phone]

## 2022-04-29 ENCOUNTER — Ambulatory Visit: Payer: Medicare PPO

## 2022-04-29 DIAGNOSIS — Z794 Long term (current) use of insulin: Secondary | ICD-10-CM

## 2022-04-29 DIAGNOSIS — E7800 Pure hypercholesterolemia, unspecified: Secondary | ICD-10-CM

## 2022-04-29 NOTE — Progress Notes (Signed)
Chronic Care Management Pharmacy Note  05/05/2022 Name:  Anthony Craig MRN:  [id number] DOB:  12-25-1927  Summary: Patient presents today for CCM follow-up.   -Patient overdue for eye exam.   Recommendations/Changes made from today's visit: Continue current medications  Plan: CPP follow-up in 6 months   Subjective: Anthony Craig is an 88 y.o. year old male who is a primary patient of Jerrol Banana., MD.  The CCM team was consulted for assistance with disease management and care coordination needs.    Engaged with patient by telephone for follow up visit in response to provider referral for pharmacy case management and/or care coordination services.   Consent to Services:  The patient was given information about Chronic Care Management services, agreed to services, and gave verbal consent prior to initiation of services.  Please see initial visit note for detailed documentation.   Patient Care Team: Jerrol Banana., MD as PCP - General (Family Medicine) Benito Mccreedy, MD as Referring Physician (Family Medicine) Dingeldein, Remo Lipps, MD (Ophthalmology) Gabriel Carina Betsey Holiday, MD as Physician Assistant (Endocrinology) Germaine Pomfret, [facility] as Pharmacist (Pharmacist)  Recent office visits: 12/16/21: Patient presnted to Dr. Rosanna Randy for follow-up.   Recent consult visits: None in past 6 months  Hospital visits: None in previous 6 months   Objective:  Lab Results  Component Value Date   CREATININE 1.0 08/26/2021   BUN 22 (A) 08/26/2021   GFRNONAA 71 08/26/2021   GFRAA 80 07/16/2020   NA 135 07/28/2021   K 4.9 08/26/2021   CALCIUM 9.4 08/26/2021   CO2 29 (A) 08/26/2021   GLUCOSE 142 (H) 07/28/2021    Lab Results  Component Value Date/Time   HGBA1C 7.7 (A) 12/16/2021 02:28 PM   HGBA1C 8.0 08/26/2021 12:00 AM   HGBA1C 8.2 (A) 12/10/2020 09:41 AM   HGBA1C 8.4 (H) 07/16/2020 09:15 AM   HGBA1C 7.6 (H) 11/23/2019 03:52 PM   HGBA1C 7.4  03/09/2017 12:00 AM   MICROALBUR 0.782 02/18/2021 12:00 AM    Last diabetic Eye exam:  Lab Results  Component Value Date/Time   HMDIABEYEEXA No Retinopathy 04/24/2020 12:00 AM    Last diabetic Foot exam: No results found for: "HMDIABFOOTEX"   Lab Results  Component Value Date   CHOL 123 08/26/2021   HDL 44 08/26/2021   LDLCALC 59 08/26/2021   TRIG 137 08/26/2021   CHOLHDL 2.6 07/16/2020       Latest Ref Rng & Units 08/26/2021   12:00 AM 07/16/2020    9:15 AM 11/23/2019    3:52 PM  Hepatic Function  Total Protein 6.0 - 8.5 g/dL  7.1  6.8   Albumin 3.5 - 5.0 3.8     4.5  4.4   AST 14 - 40 _0 ALT 10 - 40 _1 Alk Phosphatase 25 - 125 123     93  91   Total Bilirubin 0.0 - 1.2 mg/dL  0.4  0.3      This result is from an external source.    Lab Results  Component Value Date/Time   TSH 1.220 08/17/2019 08:15 AM   TSH 0.711 03/15/2015 04:20 PM   FREET4 1.20 03/15/2015 04:20 PM       Latest Ref Rng & Units 08/26/2021   12:00 AM 07/28/2021    2:48 PM 07/16/2020    9:15 AM  CBC  WBC  8.2  6.9  4.9   Hemoglobin 13.5 - 17.5 14.8     14.7  14.4   Hematocrit 41 - 53 46     43.0  44.4   Platelets 150 - 399 212     174  217      This result is from an external source.    Lab Results  Component Value Date/Time   VD25OH 37.9 08/26/2021 12:00 AM    Clinical ASCVD: Yes  The ASCVD Risk score (Arnett DK, et al., 2019) failed to calculate for the following reasons:   The 2019 ASCVD risk score is only valid for ages 42 to 42   The patient has a prior MI or stroke diagnosis       12/18/2021    7:54 AM 12/11/2021    3:04 PM 12/04/2021    7:57 AM  Depression screen PHQ 2/9  Decreased Interest 0 0 0  Down, Depressed, Hopeless 0 0 0  PHQ - 2 Score 0 0 0   Social History   Tobacco Use  Smoking Status Former   Packs/day: 0.50   Years: 30.00   Total pack years: 15.00   Types: Cigarettes   Quit date: 10/06/1979   Years since quitting: 42.6   Smokeless Tobacco Never   BP Readings from Last 3 Encounters:  02/19/22 (!) 148/89  02/13/22 140/82  12/18/21 (P) 139/90   Pulse Readings from Last 3 Encounters:  02/19/22 68  02/13/22 65  12/18/21 67   Wt Readings from Last 3 Encounters:  02/19/22 150 lb (68 kg)  02/13/22 150 lb (68 kg)  12/18/21 150 lb (68 kg)   BMI Readings from Last 3 Encounters:  02/19/22 21.22 kg/m  02/13/22 20.92 kg/m  12/18/21 21.52 kg/m    Assessment/Interventions: Review of patient past medical history, allergies, medications, health status, including review of consultants reports, laboratory and other test data, was performed as part of comprehensive evaluation and provision of chronic care management services.   SDOH:  (Social Determinants of Health) assessments and interventions performed: Yes  SDOH Screenings   Alcohol Screen: Low Risk  (12/11/2021)   Alcohol Screen    Last Alcohol Screening Score (AUDIT): 1  Depression (PHQ2-9): Low Risk  (12/18/2021)   Depression (PHQ2-9)    PHQ-2 Score: 0  Financial Resource Strain: Low Risk  (12/11/2021)   Overall Financial Resource Strain (CARDIA)    Difficulty of Paying Living Expenses: Not hard at all  Food Insecurity: No Food Insecurity (12/11/2021)   Hunger Vital Sign    Worried About Running Out of Food in the Last Year: Never true    Ran Out of Food in the Last Year: Never true  Housing: Low Risk  (12/11/2021)   Housing    Last Housing Risk Score: 0  Physical Activity: Sufficiently Active (12/11/2021)   Exercise Vital Sign    Days of Exercise per Week: 3 days    Minutes of Exercise per Session: 60 min  Social Connections: Moderately Integrated (12/11/2021)   Social Connection and Isolation Panel [NHANES]    Frequency of Communication with Friends and Family: More than three times a week    Frequency of Social Gatherings with Friends and Family: More than three times a week    Attends Religious Services: More than 4 times per year    Active Member  of Clubs or Organizations: No    Attends Archivist Meetings: Never    Marital Status: Married  Stress: Stress Concern Present (12/11/2021)  [employer] - Occupational Stress Questionnaire    Feeling of Stress : To some extent  Tobacco Use: Medium Risk (04/17/2022)   Patient History    Smoking Tobacco Use: Former    Smokeless Tobacco Use: Never    Passive Exposure: Not on file  Transportation Needs: No Transportation Needs (12/11/2021)   PRAPARE - Transportation    Lack of Transportation (Medical): No    Lack of Transportation (Non-Medical): No    CCM Care Plan  Allergies  Allergen Reactions   Doxycycline Other (See Comments)    Reaction: per [facility] patient called on 11/30/14 stating he was having a reaction to a medication possibly including redness, swelling and itching, he was not sure at that time if it was this medication or Hydrocodone-homatropin.    Hydrocodone Bit-Homatrop Mbr Other (See Comments)    Reaction:  Reaction: per [facility] patient called on 11/30/14 stating he was having a reaction to a medication possibly including redness, swelling and itching, he was not sure at that time if it was this medication or Doxy that he was taking at the same time. Patient has been taking Hydrocodone with no problems.    Medications Reviewed Today     Reviewed by Fleury, Alexandre A, RPH (Pharmacist) on 04/29/22 at 1621  Med List Status: <None>   Medication Order Taking? Sig Documenting Provider Last Dose Status Informant  Alcohol Swabs (B-D SINGLE USE SWABS REGULAR) PADS [id number]  USE AS DIRECTED  WITH  EACH  FINGERSTICK Gilbert, Richard L Jr., MD  Active   amoxicillin (AMOXIL) 875 MG tablet [id number] Yes Take 875 mg by mouth 2 (two) times daily. [provider] Taking Active   aspirin EC 81 MG tablet [id number] Yes Take 81 mg by mouth daily. [provider] Taking Active Self  atorvastatin (LIPITOR) 40 MG tablet [id number] Yes  TAKE 1 TABLET BY MOUTH EVERYDAY AT BEDTIME  Patient taking differently: 20 mg.   Gilbert, Richard L Jr., MD Taking Active            Med Note (HANCOCK, TED E   Wed Jun 20, 2020  8:18 PM)    b complex vitamins tablet [id number] Yes Take 1 tablet by mouth daily. [provider] Taking Active   calcium-vitamin D 250-100 MG-UNIT per tablet [id number] Yes Take 1 tablet by mouth daily. [provider] Taking Active   cholecalciferol (VITAMIN D) 1000 UNITS tablet [id number] Yes Take 1,000 Units by mouth daily. [provider] Taking Active   Coenzyme Q10 (COQ10) 200 MG CAPS [id number] Yes Take 100 mg by mouth daily.  [provider] Taking Active Self  diphenoxylate-atropine (LOMOTIL) 2.5-0.025 MG per tablet [id number] Yes Take 1 tablet by mouth 4 (four) times daily as needed for diarrhea or loose stools. [provider] Taking Active Self  fluticasone (FLONASE) 50 MCG/ACT nasal spray [id number] Yes Place 2 sprays into both nostrils daily. Gilbert, Richard L Jr., MD Taking Active   Ginkgo Biloba 40 MG TABS [id number] Yes Take 1 tablet by mouth daily with supper. [provider] Taking Active   Glucosamine-Chondroitin (OSTEO BI-FLEX REGULAR STRENGTH PO) [id number] Yes Take 1 tablet by mouth in the morning and at bedtime. [provider] Taking Active   hydrocortisone 2.5 % lotion [id number]  Apply 1 application topically 2 (two) times daily. To affected areas of face only as needed for flares of itching/scaling  [provider]  Active   hydroxypropyl methylcellulose / hypromellose (ISOPTO TEARS / GONIOVISC) 2.5 %   ophthalmic solution [id number] Yes Place 1 drop into both eyes 4 (four) times daily as needed for dry eyes. [provider] Taking Active   insulin aspart protamine - aspart (NOVOLOG MIX 70/30 FLEXPEN) (70-30) 100 UNIT/ML FlexPen [id number] Yes 16 units in the am and 7 units after dinner. [provider] Taking Active    lipase/protease/amylase (CREON) 36000 UNITS CPEP capsule [id number] Yes Take 36,000 Units by mouth 3 (three) times daily with meals. [provider] Taking Active   Misc Natural Products [facility] COMPLEX PO) [id number] Yes Take 1 tablet by mouth daily with lunch. [provider] Taking Active   Multiple Vitamins-Minerals (MULTIVITAMIN PO) [id number] Yes Take 1 tablet by mouth daily. [provider] Taking Active Self  Multiple Vitamins-Minerals (PRESERVISION AREDS PO) [id number] Yes Take by mouth.  [provider] Taking Active   Omega-3 Fatty Acids (FISH OIL) 1000 MG CAPS [id number] Yes Take 1 capsule by mouth daily with supper. [provider] Taking Active   omeprazole (PRILOSEC) 20 MG capsule [id number] Yes Take 20 mg by mouth daily. [provider] Taking Active   OVER THE COUNTER MEDICATION [id number] Yes Focus Factor multivitamin 1 tablet by mouth daily at noon [provider] Taking Active   primidone (MYSOLINE) 50 MG tablet [id number]  Take 1 tablet (50 mg total) by mouth 4 (four) times daily.  Patient not taking: Reported on 02/13/2022   Jerrol Banana., MD  Active   ursodiol (ACTIGALL) 300 MG capsule [id number] Yes Take 300 mg by mouth 2 (two) times daily. [provider] Taking Active   vitamin B-12 (CYANOCOBALAMIN) 1000 MCG tablet [id number] Yes Take 5,000 mcg by mouth daily.  [provider] Taking Active Self  vitamin E 180 MG (400 UNITS) capsule [id number] Yes Take 400 Units by mouth daily. [provider] Taking Active   Med List Note Rise Patience, RN 02/13/22 1458): 02/13/22 Pt given signed application for handicapped placard            Patient Active Problem List   Diagnosis Date Noted   Lumbar radiculopathy 08/22/2021   Spinal stenosis, lumbar region, with neurogenic claudication 08/22/2021   Lumbar degenerative disc disease 08/22/2021   Lumbar spondylosis 08/22/2021   Chronic  hepatitis ([place]) 08/11/2021   Viral upper respiratory tract infection 11/28/2020   Chest pain 04/27/2015   Allergic drug reaction 02/08/2015   B12 deficiency 02/08/2015   Cervical nerve root disorder 02/08/2015   Chest discomfort 02/08/2015   Vertigo 02/08/2015   Benign essential tremor 02/08/2015   Fatigue 02/08/2015   Acid reflux 02/08/2015   HLD (hyperlipidemia) 02/08/2015   BP (high blood pressure) 02/08/2015   Hernia, inguinal, left 02/08/2015   Cannot sleep 02/08/2015   CA of prostate ([place]) 02/08/2015   Type 2 diabetes mellitus with other circulatory complication, with long-term current use of insulin ([place])    Chronic pancreatitis, unspecified pancreatitis type ([place])     Immunization History  Administered Date(s) Administered   Fluad Quad(high Dose 65+) 07/11/2020, 07/22/2021   Influenza, High Dose Seasonal PF 07/21/2016, 06/14/2019   Influenza, Seasonal, Injecte, Preservative Fre 07/14/2013, 07/05/2014, 07/09/2015   Influenza,inj,Quad PF,6+ Mos 07/01/2017   Influenza,inj,Quad PF,6-35 Mos 07/01/2017   Influenza-Unspecified 07/06/2006, 07/17/2007, 07/06/2008, 08/02/2009, 08/06/2009, 07/12/2012, 07/09/2015, 06/28/2018, 06/07/2019   PFIZER(Purple Top)SARS-COV-2 Vaccination 10/20/2019, 11/08/2019, 09/13/2020   Pneumococcal Conjugate-13 11/08/2013, 11/14/2013   Pneumococcal Polysaccharide-23 06/16/2007, 06/06/2008, 06/08/2010   Pneumococcal-Unspecified 06/06/2008   Td 06/08/2006   Tdap 04/10/2004, 07/01/2017   Zoster Recombinat (Shingrix) 12/04/2020, 06/06/2021  Conditions to be addressed/monitored:  Hypertension, Hyperlipidemia, and Diabetes  Care Plan : General Pharmacy (Adult)  Updates made by Germaine Pomfret, RPH since 05/05/2022 12:00 AM     Problem: Hypertension, Hyperlipidemia, and Diabetes   Priority: High     Long-Range Goal: Patient-Specific Goal   Start Date: 05/03/2021  Expected End Date: 05/06/2023  This Visit's Progress: On track  Recent Progress:  On track  Priority: High  Note:   Current Barriers:  No barriers noted  Pharmacist Clinical Goal(s):  Patient will maintain control of diabetes as evidenced by A1c less than 8%  maintain control of hyperlipidemia as evidenced by LDL less than 100 through collaboration with PharmD and provider.   Interventions: 1:1 collaboration with Jerrol Banana., MD regarding development and update of comprehensive plan of care as evidenced by provider attestation and co-signature Inter-disciplinary care team collaboration (see longitudinal plan of care) Comprehensive medication review performed; medication list updated in electronic medical record  Hypertension (BP goal <140/90) -Controlled -Current treatment: None -Medications previously tried: NA  -Denies hypotensive symptoms:  -Educated on Daily salt intake goal < 2300 mg; -Counseled on diet and exercise extensively  Hyperlipidemia: (LDL goal < 100) -Controlled -Current treatment: Atorvastatin 40 mg nightly: Appropriate, Effective, Safe, Accessible Lovaza 1g twice daily: Query appropriate -Medications previously tried: NA  -Query appropriate use of lovaza in this 88 year old patient given history of excellent triglyceride control.  -Recommended to continue current medication  Diabetes (A1c goal <8%) -Uncontrolled -[facility]. A1c 8.9%. Managed my [place] endocrinology -Current medications: Novolog 70/30 16 units AM, 7 units PM -Medications previously tried: NA  -Current home glucose readings: Uses Dexcom G6 -Denies hypoglycemic symptoms  -Current exercise: Exercises 3x weekly gym.  -Recommended to continue current medication  Osteoarthritis (Goal: Minimize arthritis pain) -Controlled -Current treatment  Osteo Bi-Flex twice daily  -Medications previously tried: NA -Patient reports supplementation has improved his arthritis pain.  -Recommend Acetaminophen CR 650 mg every 8 hours as needed. Counseled  to avoid >3000 mg acetaminophen daily  -Recommended to continue current medication  Patient Goals/Self-Care Activities Patient will:  - check glucose at least four times daily, document, and provide at future appointments check blood pressure weekly, document, and provide at future appointments  Follow Up Plan: Telephone follow up appointment with care management team member scheduled for:  08/12/2022 at 3:45 PM       Medication Assistance: None required.  Patient affirms current coverage meets needs.  Compliance/Adherence/Medication fill history: Care Gaps: FOOT EXAM URINE MICROALBUMIN COVID-19 Vaccine Booster 4 Zoster Vaccines- Shingrix  (1st one given on 12/04/2020) OPHTHALMOLOGY EXAM (last completed 04/24/2020)  Star-Rating Drugs: Atorvastatin 40 mg last filled on 10/11/2019 for a 90-Day supply with [facility]  Patient's preferred pharmacy is:  [facility] [address], [place] - [place] [address] Phone: [phone] Fax: [place], [place] [address] Phone: [phone] Fax: [phone]  [id number] [id number]: [phone]: [phone] Uses pill box? Yes Pt endorses 100% compliance  We discussed: Current pharmacy is preferred with insurance plan and patient is satisfied with pharmacy services Patient decided to: Continue current medication management strategy  Care Plan and Follow Up Patient Decision:  Patient agrees to Care Plan and Follow-up.  Plan: Telephone follow up appointment with care management  team member scheduled for:  08/12/2022 at 3:45 PM  Alex Fleury, PharmD, BCACP, CPP  Clinical Pharmacist [facility] [phone] 

## 2022-05-05 NOTE — Patient Instructions (Signed)
Visit Information It was great speaking with you today!  Please let me know if you have any questions about our visit.   Goals Addressed             This Visit's Progress    Monitor and Manage My Blood Sugar-Diabetes Type 2   On track    Timeframe:  Long-Range Goal Priority:  High Start Date:  05/03/21                            Expected End Date: 05/03/22                      Follow Up within 90 days    - check blood sugar at prescribed times - check blood sugar before and after exercise - check blood sugar if I feel it is too high or too low - enter blood sugar readings and medication or insulin into daily log    Why is this important?   Checking your blood sugar at home helps to keep it from getting very high or very low.  Writing the results in a diary or log helps the doctor know how to care for you.  Your blood sugar log should have the time, date and the results.  Also, write down the amount of insulin or other medicine that you take.  Other information, like what you ate, exercise done and how you were feeling, will also be helpful.     Notes:         Patient Care Plan: General Pharmacy (Adult)     Problem Identified: Hypertension, Hyperlipidemia, and Diabetes   Priority: High     Long-Range Goal: Patient-Specific Goal   Start Date: 05/03/2021  Expected End Date: 05/06/2023  This Visit's Progress: On track  Recent Progress: On track  Priority: High  Note:   Current Barriers:  No barriers noted  Pharmacist Clinical Goal(s):  Patient will maintain control of diabetes as evidenced by A1c less than 8%  maintain control of hyperlipidemia as evidenced by LDL less than 100 through collaboration with PharmD and provider.   Interventions: 1:1 collaboration with Jerrol Banana., MD regarding development and update of comprehensive plan of care as evidenced by provider attestation and co-signature Inter-disciplinary care team collaboration (see longitudinal  plan of care) Comprehensive medication review performed; medication list updated in electronic medical record  Hypertension (BP goal <140/90) -Controlled -Current treatment: None -Medications previously tried: NA  -Denies hypotensive symptoms:  -Educated on Daily salt intake goal < 2300 mg; -Counseled on diet and exercise extensively  Hyperlipidemia: (LDL goal < 100) -Controlled -Current treatment: Atorvastatin 40 mg nightly: Appropriate, Effective, Safe, Accessible Lovaza 1g twice daily: Query appropriate -Medications previously tried: NA  -Query appropriate use of lovaza in this 88 year old patient given history of excellent triglyceride control.  -Recommended to continue current medication  Diabetes (A1c goal <8%) -Uncontrolled -Microalbuminuria noted [facility] 04/18/22. A1c 8.9%. Managed my [place] endocrinology -Current medications: Novolog 70/30 16 units AM, 7 units PM -Medications previously tried: NA  -Current home glucose readings: Uses Dexcom G6 -Denies hypoglycemic symptoms  -Current exercise: Exercises 3x weekly gym.  -Recommended to continue current medication  Osteoarthritis (Goal: Minimize arthritis pain) -Controlled -Current treatment  Osteo Bi-Flex twice daily  -Medications previously tried: NA -Patient reports supplementation has improved his arthritis pain.  -Recommend Acetaminophen CR 650 mg every 8 hours as needed. Counseled to avoid >3000 mg  acetaminophen daily  -Recommended to continue current medication  Patient Goals/Self-Care Activities Patient will:  - check glucose at least four times daily, document, and provide at future appointments check blood pressure weekly, document, and provide at future appointments  Follow Up Plan: Telephone follow up appointment with care management team member scheduled for:  08/12/2022 at 3:45 PM    Patient agreed to services and verbal consent obtained.   The patient verbalized understanding of instructions,  educational materials, and care plan provided today and DECLINED offer to receive copy of patient instructions, educational materials, and care plan.   [name], PharmD, [place], CPP  Clinical Pharmacist Practitioner  [facility] [phone]

## 2022-05-06 ENCOUNTER — Other Ambulatory Visit: Payer: Self-pay | Admitting: *Deleted

## 2022-05-06 DIAGNOSIS — Z0100 Encounter for examination of eyes and vision without abnormal findings: Secondary | ICD-10-CM

## 2022-05-06 DIAGNOSIS — Z794 Long term (current) use of insulin: Secondary | ICD-10-CM

## 2022-05-09 ENCOUNTER — Emergency Department: Payer: No Typology Code available for payment source

## 2022-05-09 ENCOUNTER — Emergency Department
Admission: EM | Admit: 2022-05-09 | Discharge: 2022-05-09 | Disposition: A | Discharge status: Home / Self Care | Payer: No Typology Code available for payment source | Attending: Emergency Medicine | Admitting: Emergency Medicine

## 2022-05-09 ENCOUNTER — Other Ambulatory Visit: Payer: Self-pay

## 2022-05-09 DIAGNOSIS — K2970 Gastritis, unspecified, without bleeding: Secondary | ICD-10-CM | POA: Insufficient documentation

## 2022-05-09 DIAGNOSIS — R079 Chest pain, unspecified: Secondary | ICD-10-CM | POA: Diagnosis present

## 2022-05-09 LAB — CBC
HCT: 43.5 % (ref 39.0–52.0)
Hemoglobin: 13.9 g/dL (ref 13.0–17.0)
MCH: 27.2 pg (ref 26.0–34.0)
MCHC: 32 g/dL (ref 30.0–36.0)
MCV: 85.1 fL (ref 80.0–100.0)
Platelets: 182 10*3/uL (ref 150–400)
RBC: 5.11 MIL/uL (ref 4.22–5.81)
RDW: 13.8 % (ref 11.5–15.5)
WBC: 8.4 10*3/uL (ref 4.0–10.5)
nRBC: 0 % (ref 0.0–0.2)

## 2022-05-09 LAB — BASIC METABOLIC PANEL
Anion gap: 6 (ref 5–15)
BUN: 18 mg/dL (ref 8–23)
CO2: 29 mmol/L (ref 22–32)
Calcium: 9.7 mg/dL (ref 8.9–10.3)
Chloride: 105 mmol/L (ref 98–111)
Creatinine, Ser: 0.9 mg/dL (ref 0.61–1.24)
GFR, Estimated: >60 mL/min (ref >60)
Glucose, Bld: 141 mg/dL — ABNORMAL HIGH (ref 70–99)
Potassium: 4.1 mmol/L (ref 3.5–5.1)
Sodium: 140 mmol/L (ref 135–145)

## 2022-05-09 LAB — TROPONIN I (HIGH SENSITIVITY)
Troponin I (High Sensitivity): 8 ng/L (ref <18)
Troponin I (High Sensitivity): 8 ng/L (ref <18)

## 2022-05-09 MED ORDER — ALUM & MAG HYDROXIDE-SIMETH 200-200-20 MG/5ML PO SUSP
30.0000 mL | Freq: Once | ORAL | Status: AC
  Administered 2022-05-09: 30 mL via ORAL
  Filled 2022-05-09: qty 30

## 2022-05-09 MED ORDER — LIDOCAINE VISCOUS HCL 2 % MT SOLN
15.0000 mL | Freq: Once | OROMUCOSAL | Status: AC
  Administered 2022-05-09: 15 mL via ORAL
  Filled 2022-05-09: qty 15

## 2022-05-09 NOTE — ED Triage Notes (Signed)
Pt to ED for R sided chest pain, "catching me when I take a deep breath" since 1.5 hr ago. Pt is alert, oriented. Denies SOB, dizziness.   Pt states did 30 min aerobic exercise at gym this morning.

## 2022-05-09 NOTE — ED Provider Notes (Signed)
[facility] Provider Note    Event Date/Time   First MD Initiated Contact with Patient 05/09/22 2229     (approximate)   History   Chest Pain   HPI  Anthony Craig is a 88 y.o. male who presents to the emergency department today because of concerns for chest pain.  The patient had prepared a pizza for dinner tonight.  After eating that he went and lying on his recliner took a short nap.  When he woke up he had discomfort in his right lower chest.  Did feel like it was worse with deep breaths.  Did not however particularly feel short of breath.  The time my exam his pain has improved.  It was not associated with any nausea or vomiting.  He denies any fevers.  States he does have a history of stomach issues.     Physical Exam   Triage Vital Signs: ED Triage Vitals  Enc Vitals Group     BP 05/09/22 1847 (!) 155/68     Pulse Rate 05/09/22 1847 76     Resp 05/09/22 1847 18     Temp 05/09/22 1847 98.6 F (37 C)     Temp Source 05/09/22 1847 Oral     SpO2 05/09/22 1847 94 %     Weight 05/09/22 1849 145 lb (65.8 kg)     Height 05/09/22 1849 '5\' 10"'$  (1.778 m)     Head Circumference --      Peak Flow --      Pain Score 05/09/22 1848 3     Pain Loc --      Pain Edu? --      Excl. in [place]? --     Most recent vital signs: Vitals:   05/09/22 1847 05/09/22 2108  BP: (!) 155/68 (!) 150/135  Pulse: 76 64  Resp: 18 18  Temp: 98.6 F (37 C) 97.8 F (36.6 C)  SpO2: 94% 96%   General: Awake, alert, oriented. CV:  Good peripheral perfusion. Regular rate and rhythm. Resp:  Normal effort. Lungs clear. Abd:  No distention.  Other:  No lower extremity edema.    ED Results / Procedures / Treatments   Labs (all labs ordered are listed, but only abnormal results are displayed) Labs Reviewed  BASIC METABOLIC PANEL - Abnormal; Notable for the following components:      Result Value   Glucose, Bld 141 (*)    All other components within normal limits   CBC  TROPONIN I (HIGH SENSITIVITY)  TROPONIN I (HIGH SENSITIVITY)     EKG  I, Nance Pear, attending physician, personally viewed and interpreted this EKG  EKG Time: 1844 Rate: 77 Rhythm: sinus rhythm with 1st degree av block Axis: normal Intervals: qtc 459 QRS: RBBB ST changes: no st elevation Impression: abnormal ekg   RADIOLOGY I independently interpreted and visualized the CXR. My interpretation: No pneumonia. No pneumothorax. Radiology interpretation:  IMPRESSION:  No active cardiopulmonary disease.     PROCEDURES:  Critical Care performed: No  Procedures   MEDICATIONS ORDERED IN ED: Medications - No data to display   IMPRESSION / MDM / [place] / ED COURSE  I reviewed the triage vital signs and the nursing notes.                              Differential diagnosis includes, but is not limited to, ACS, pneumonia, pneumothorax, PE, dissection,  gastritis.  Patient's presentation is most consistent with acute presentation with potential threat to life or bodily function.  Patient presented to the emergency department today because of concerns for chest pain.  Thumb exam he states he had already started to have some improvement although still had some lingering pain.  Troponin was negative x2.  Chest x-ray without concerning findings for pneumonia or pneumothorax.  Given improvement I have low concern for PE or dissection.  I did give the patient a GI cocktail and this did improve his pain and he was pain-free afterwards.  This time I do think likely gastritis.  Patient does state he has a history of abdominal issues.  I do think it might of been made worse by some spicy pepperoni he had tonight for dinner.  Discussed with patient portance of follow-up with primary care. FINAL CLINICAL IMPRESSION(S) / ED DIAGNOSES   Final diagnoses:  Gastritis, presence of bleeding unspecified, unspecified chronicity, unspecified gastritis type     Note:  This  document was prepared using Dragon voice recognition software and may include unintentional dictation errors.    Nance Pear, MD 05/09/22 [phone]

## 2022-05-09 NOTE — Discharge Instructions (Signed)
Please seek medical attention for any high fevers, chest pain, shortness of breath, change in behavior, persistent vomiting, bloody stool or any other new or concerning symptoms.  

## 2022-05-20 ENCOUNTER — Telehealth: Payer: Self-pay

## 2022-05-20 NOTE — Progress Notes (Signed)
Chronic Care Management Pharmacy Assistant   Name: Marven Veley  MRN: [id number] DOB: [birth year]  Reason for Encounter: Medication Review/General Adherence Call.   Recent office visits:  None ID  Recent consult visits:  05/08/2022 Dr. Roxan Hockey MD [facility]) No Medication Changes Noted.  Hospital visits:  Medication Reconciliation was completed by comparing discharge summary, patient's EMR and Pharmacy list, and upon discussion with patient.  Admitted to the hospital on 05/09/2022 due to Gastritis. Discharge date was 05/09/2022. Discharged from [place]?Medications Started at [facility] Discharge:?? -started None ID  Medication Changes at Hospital Discharge: -Changed None ID  Medications Discontinued at Hospital Discharge: -Stopped None ID  Medications that remain the same after Hospital Discharge:??  -All other medications will remain the same.    Medications: Outpatient Encounter Medications as of 05/20/2022  Medication Sig   Alcohol Swabs (B-D SINGLE USE SWABS REGULAR) PADS USE AS DIRECTED  WITH  EACH  FINGERSTICK   aspirin EC 81 MG tablet Take 81 mg by mouth daily.   atorvastatin (LIPITOR) 40 MG tablet TAKE 1 TABLET BY MOUTH EVERYDAY AT BEDTIME (Patient taking differently: 20 mg.)   b complex vitamins tablet Take 1 tablet by mouth daily.   calcium-vitamin D 250-100 MG-UNIT per tablet Take 1 tablet by mouth daily.   cholecalciferol (VITAMIN D) 1000 UNITS tablet Take 1,000 Units by mouth daily.   Coenzyme Q10 (COQ10) 200 MG CAPS Take 100 mg by mouth daily.    diphenoxylate-atropine (LOMOTIL) 2.5-0.025 MG per tablet Take 1 tablet by mouth 4 (four) times daily as needed for diarrhea or loose stools.   fluticasone (FLONASE) 50 MCG/ACT nasal spray Place 2 sprays into both nostrils daily.   Ginkgo Biloba 40 MG TABS Take 1 tablet by mouth daily with supper.   Glucosamine-Chondroitin (OSTEO BI-FLEX REGULAR STRENGTH PO) Take 1 tablet  by mouth in the morning and at bedtime.   hydrocortisone 2.5 % lotion Apply 1 application topically 2 (two) times daily. To affected areas of face only as needed for flares of itching/scaling    hydroxypropyl methylcellulose / hypromellose (ISOPTO TEARS / GONIOVISC) 2.5 % ophthalmic solution Place 1 drop into both eyes 4 (four) times daily as needed for dry eyes.   insulin aspart protamine - aspart (NOVOLOG MIX 70/30 FLEXPEN) (70-30) 100 UNIT/ML FlexPen 16 units in the am and 7 units after dinner.   lipase/protease/amylase (CREON) 36000 UNITS CPEP capsule Take 36,000 Units by mouth 3 (three) times daily with meals.   Misc Natural Products (GINSENG COMPLEX PO) Take 1 tablet by mouth daily with lunch.   Multiple Vitamins-Minerals (MULTIVITAMIN PO) Take 1 tablet by mouth daily.   Multiple Vitamins-Minerals (PRESERVISION AREDS PO) Take by mouth.    Omega-3 Fatty Acids (FISH OIL) 1000 MG CAPS Take 1 capsule by mouth daily with supper.   omeprazole (PRILOSEC) 20 MG capsule Take 20 mg by mouth daily.   OVER THE COUNTER MEDICATION Focus Factor multivitamin 1 tablet by mouth daily at noon   primidone (MYSOLINE) 50 MG tablet Take 1 tablet (50 mg total) by mouth 4 (four) times daily. (Patient not taking: Reported on 02/13/2022)   ursodiol (ACTIGALL) 300 MG capsule Take 300 mg by mouth 2 (two) times daily.   vitamin B-12 (CYANOCOBALAMIN) 1000 MCG tablet Take 5,000 mcg by mouth daily.    vitamin E 180 MG (400 UNITS) capsule Take 400 Units by mouth daily.   No facility-administered encounter medications on file as of 05/20/2022.  Care Gaps: FOOT EXAM COVID-19 Vaccine Booster 4 Influenza Vaccine     Star Rating Drugs: Atorvastatin 40 mg last filled on 10/11/2019 for a 90-Day supply with [facility] (patient also does go to the [place] so it's possible he is getting his prescriptions from there pharmacy)  Called patient and discussed medication adherence  with patient, no issues at this time with current  medication.   Patient Reports ED visit since his last CPP follow up.    Patient reports he is feeling much better, and denies any discomfort in his chest.Patient states he had a appointment at the [place] where they did some scans, and everything was normal.  Patient Denies  any side effects with his medication.  Patient Denies  any problems with hiscurrent pharmacy  Patient has a follow up with his PCP on 06/30/2022.  Patient reports he has not schedule his eye appointment because he has to go to the [place] for this. Patient states he will reach out to the [place], and schedule his eye exam.  Telephone follow up appointment with Care management team member scheduled for : 08/12/2022 at 3:00 pm.  [place] Pharmacist Assistant [phone]

## 2022-06-27 NOTE — Progress Notes (Deleted)
Established patient visit   Patient: Anthony Craig   DOB: [birth year]   88 y.o. Male  MRN: [id number] Visit Date: 06/30/2022  Today's healthcare provider: Wilhemena Durie, MD   No chief complaint on file.  Subjective    HPI  Diabetes Mellitus Type II, follow-up  Lab Results  Component Value Date   HGBA1C 7.7 (A) 12/16/2021   HGBA1C 8.0 08/26/2021   HGBA1C 8.2 (A) 12/10/2020   Last seen for diabetes 6 months ago.  Management since then includes continuing the same treatment.  Home blood sugar records: {diabetes glucometry results:16657} Most Recent Eye Exam: due  --------------------------------------------------------------------------------------------------- Hypertension, follow-up  BP Readings from Last 3 Encounters:  05/09/22 117/85  02/19/22 (!) 148/89  02/13/22 140/82   Wt Readings from Last 3 Encounters:  05/09/22 145 lb (65.8 kg)  02/19/22 150 lb (68 kg)  02/13/22 150 lb (68 kg)     He was last seen for hypertension 6 months ago.  Management since that visit includes; Good control.  Outside blood pressures are {enter patient reported home BP, or 'not being checked':1}.  ---------------------------------------------------------------------------------------------------   Medications: Outpatient Medications Prior to Visit  Medication Sig   Alcohol Swabs (B-D SINGLE USE SWABS REGULAR) PADS USE AS DIRECTED  WITH  EACH  FINGERSTICK   aspirin EC 81 MG tablet Take 81 mg by mouth daily.   atorvastatin (LIPITOR) 40 MG tablet TAKE 1 TABLET BY MOUTH EVERYDAY AT BEDTIME (Patient taking differently: 20 mg.)   b complex vitamins tablet Take 1 tablet by mouth daily.   calcium-vitamin D 250-100 MG-UNIT per tablet Take 1 tablet by mouth daily.   cholecalciferol (VITAMIN D) 1000 UNITS tablet Take 1,000 Units by mouth daily.   Coenzyme Q10 (COQ10) 200 MG CAPS Take 100 mg by mouth daily.    diphenoxylate-atropine (LOMOTIL) 2.5-0.025 MG per tablet Take 1  tablet by mouth 4 (four) times daily as needed for diarrhea or loose stools.   fluticasone (FLONASE) 50 MCG/ACT nasal spray Place 2 sprays into both nostrils daily.   Ginkgo Biloba 40 MG TABS Take 1 tablet by mouth daily with supper.   Glucosamine-Chondroitin (OSTEO BI-FLEX REGULAR STRENGTH PO) Take 1 tablet by mouth in the morning and at bedtime.   hydrocortisone 2.5 % lotion Apply 1 application topically 2 (two) times daily. To affected areas of face only as needed for flares of itching/scaling    hydroxypropyl methylcellulose / hypromellose (ISOPTO TEARS / GONIOVISC) 2.5 % ophthalmic solution Place 1 drop into both eyes 4 (four) times daily as needed for dry eyes.   insulin aspart protamine - aspart (NOVOLOG MIX 70/30 FLEXPEN) (70-30) 100 UNIT/ML FlexPen 16 units in the am and 7 units after dinner.   lipase/protease/amylase (CREON) 36000 UNITS CPEP capsule Take 36,000 Units by mouth 3 (three) times daily with meals.   Misc Natural Products (GINSENG COMPLEX PO) Take 1 tablet by mouth daily with lunch.   Multiple Vitamins-Minerals (MULTIVITAMIN PO) Take 1 tablet by mouth daily.   Multiple Vitamins-Minerals (PRESERVISION AREDS PO) Take by mouth.    Omega-3 Fatty Acids (FISH OIL) 1000 MG CAPS Take 1 capsule by mouth daily with supper.   omeprazole (PRILOSEC) 20 MG capsule Take 20 mg by mouth daily.   OVER THE COUNTER MEDICATION Focus Factor multivitamin 1 tablet by mouth daily at noon   primidone (MYSOLINE) 50 MG tablet Take 1 tablet (50 mg total) by mouth 4 (four) times daily. (Patient not taking: Reported on 02/13/2022)   ursodiol (  ACTIGALL) 300 MG capsule Take 300 mg by mouth 2 (two) times daily.   vitamin B-12 (CYANOCOBALAMIN) 1000 MCG tablet Take 5,000 mcg by mouth daily.    vitamin E 180 MG (400 UNITS) capsule Take 400 Units by mouth daily.   No facility-administered medications prior to visit.    Review of Systems  Constitutional:  Negative for appetite change, chills and fever.   Respiratory:  Negative for chest tightness, shortness of breath and wheezing.   Cardiovascular:  Negative for chest pain and palpitations.  Gastrointestinal:  Negative for abdominal pain, nausea and vomiting.    {Labs  Heme  Chem  Endocrine  Serology  Results Review (optional):23779}   Objective    There were no vitals taken for this visit. {Show previous vital signs (optional):23777}  Physical Exam  ***  No results found for any visits on 06/30/22.  Assessment & Plan     ***  No follow-ups on file.      {provider attestation***:1}   Wilhemena Durie, MD  [facility] [phone] (phone) [phone] (fax)  [place]

## 2022-06-30 ENCOUNTER — Ambulatory Visit: Payer: Medicare PPO | Admitting: Family Medicine

## 2022-06-30 DIAGNOSIS — F329 Major depressive disorder, single episode, unspecified: Secondary | ICD-10-CM

## 2022-06-30 DIAGNOSIS — F411 Generalized anxiety disorder: Secondary | ICD-10-CM

## 2022-06-30 DIAGNOSIS — I25708 Atherosclerosis of coronary artery bypass graft(s), unspecified, with other forms of angina pectoris: Secondary | ICD-10-CM

## 2022-06-30 DIAGNOSIS — E7800 Pure hypercholesterolemia, unspecified: Secondary | ICD-10-CM

## 2022-06-30 DIAGNOSIS — I10 Essential (primary) hypertension: Secondary | ICD-10-CM

## 2022-06-30 DIAGNOSIS — K219 Gastro-esophageal reflux disease without esophagitis: Secondary | ICD-10-CM

## 2022-06-30 DIAGNOSIS — Z794 Long term (current) use of insulin: Secondary | ICD-10-CM

## 2022-07-01 ENCOUNTER — Telehealth: Payer: Self-pay | Admitting: Family Medicine

## 2022-07-01 NOTE — Telephone Encounter (Signed)
Please inform the patient of the following:   I would be wary of any company or product that "guarantees" pain relief. Some people experience pain relief with CBD products, others do not find it helpful. Additionally, CBD can affect the liver, given he has a history of hepatitis CBD may be a riskier medication for him to start. If he does decide to try the medication, I would recommend starting at the lowest possible dose.   [name], PharmD, [place], CPP  Clinical Pharmacist Practitioner  [facility] [phone]

## 2022-07-01 NOTE — Telephone Encounter (Signed)
Pt would like to know if you have any information on the HEM[employer] CBD gummies? Pt states the company guarantees it will relieve his pain. Pt would like your thoughts before he spends  $100.00 for a 3 month supply.  [phone]

## 2022-07-02 NOTE — Progress Notes (Signed)
Per Clinical pharmacist, Please inform the patient of the following:   I would be wary of any company or product that "guarantees" pain relief. Some people experience pain relief with CBD products, others do not find it helpful. Additionally, CBD can affect the liver, given he has a history of hepatitis CBD may be a riskier medication for him to start. If he does decide to try the medication, I would recommend starting at the lowest possible dose.   Patient verbalized understanding, and reports he will "think about it, and may not try it".  [place] Pharmacist Assistant [phone]

## 2022-07-25 NOTE — Progress Notes (Addendum)
 Subjective:  Patient ID: Anthony Craig is a 88 y.o. male.  Chief Complaint  Patient presents with  . Constitutional  . Lab Collection     HPI  Pt presents with generalized fatigue x 2 days.  Denies fever.  Constitutional Patient presents with: fatigue   Onset quality:  Sudden Duration of current symptoms:  2 days Timing:  Unchanged Chronicity:  New Associated symptoms: no abdominal pain, no arthralgias, no chest pain, no chills, no decreased appetite, no dizziness, no ear pain, no sinus congestion, no neck stiffness and no lethargy   For patients with Menopausal Symptoms:    Associated symptoms: no arthralgias          Review of Systems  Constitutional: Negative for chills and decreased appetite.  HENT: Negative for ear pain.   Cardiovascular: Negative for chest pain.  Gastrointestinal: Negative for abdominal pain.  Musculoskeletal: Negative for arthralgias and neck stiffness.  Neurological: Negative for dizziness.    Social History   Tobacco Use  Smoking Status Former  . Types: Cigarettes  Smokeless Tobacco Never   Past Medical History:  Diagnosis Date  . Diabetes mellitus   . GERD (gastroesophageal reflux disease)   . Hypercholesteremia   . Pancreatitis    Past Surgical History:  Procedure Laterality Date  . APPENDECTOMY     No family history on file. Objective:      Physical Exam HENT:     Head: Normocephalic.     Right Ear: Tympanic membrane, ear canal and external ear normal.     Left Ear: Tympanic membrane, ear canal and external ear normal.     Nose: Nose normal.     Mouth/Throat:     Mouth: Mucous membranes are moist.     Pharynx: Oropharynx is clear.  Cardiovascular:     Rate and Rhythm: Normal rate and regular rhythm.     Pulses: Normal pulses.     Heart sounds: Normal heart sounds.  Pulmonary:     Effort: Pulmonary effort is normal.     Breath sounds: Decreased breath sounds present.  Abdominal:     General: Abdomen is flat.  Bowel sounds are normal.     Palpations: Abdomen is soft.     Tenderness: There is no right CVA tenderness or left CVA tenderness.  Musculoskeletal:        General: Normal range of motion.  Lymphadenopathy:     Cervical: No cervical adenopathy.  Skin:    General: Skin is warm and dry.     Capillary Refill: Capillary refill takes less than 2 seconds.  Neurological:     General: No focal deficit present.     Mental Status: He is alert and oriented to person, place, and time.  Psychiatric:        Mood and Affect: Mood normal.        Behavior: Behavior normal.        Thought Content: Thought content normal.        Judgment: Judgment normal.       Assessment/Plan:  HPI provided by Self  Based on today's visit:history, physical exam and all relevant testing completed in clinic today patient's visit diagnosis is/includes  1. Fatigue, unspecified type   2. Type 2 diabetes mellitus without complication, with long-term current use of insulin    3. Pure hypercholesterolemia, unspecified   4. Elevated blood-pressure reading without diagnosis of hypertension    Patient has a history of chronic conditions and those listed in the visit diagnoses were reviewed  today. They are currently stable on medications.  Treatment plan includes:  Orders Placed: Orders Placed This Encounter  Procedures  . CBC W/DIFF W/PLT  . Comprehensive metabolic panel  . TSH AND FREE T4  . Vitamin B12  . Lab Collection Performed By  . Venipuncture  . Influenza Molecular POCT  . LumiraDX SARS-COV-2 Rapid Result Antigen Test   Medications ordered this visit    No prescriptions requested or ordered in this encounter    Current medication list and any new medications prescribed or recommended today were reviewed with the patient and specific instructions were provided Yes  Provider Recommendations   We will contact you with lab results.  Increase hydration and get plenty of rest.  If symptoms worsen go  directly to the ER.  Follow up with your PCP for further evaluation in the next week.     Follow up care instructions were provided and reviewed?with the  Patient. All questions were answered. Patient verbalized understanding of plan of care today.    I have reviewed with the patient the importance of following up with a Primary Care Provider (PCP) for overall management of preventative and chronic care needs. : Yes         PHQ-2 Total Score: 0   This patient Mini-Cog tests:: 5    Blood pressure elevated at today's visit; no history of hypertension. Encouraged patient to return to [facility] or follow-up with PCP for re-evaluation of elevated BP.  Monitor your BP daily and keep a log, if it continues to be high follow up with your PCP in 2-3 days.  If you develop a severe headache or dizziness go directly to the ER.       [name] Patient presents with well controlled type 2 Diabetes at time of visit. There are not associated manifestations due to Diabetes status. Patient's medication regimen has been appropriately documented on the medication list. Routine labs related to chronic condition have been performed within the last year. Reinforced management strategies and reviewed considerations for current treatment, within the context of the underlying condition as well as chronic conditions.  Pt last A1C 8.8.  [name] Patient presents with well controlled Hyperlipidemia at time of visit. Patient's medication regimen has been appropriately documented on the medication list. Routine labs related to chronic condition have been performed within the last year. Reinforced management strategies and reviewed considerations for current treatment, within the context of the underlying condition as well as chronic conditions.                Lab Collection Procedure Note  Subjective:   Patient ID: Anthony Craig is a 88 y.o. male here for a lab collection visit.                                   Specimen(s) sent to lab via courier for analysis.  Laboratory specimen(s) collected in accordance with the reference laboratory and  [facility] guidelines.     Phlebotomy Informed Consent:   I verify that I have provided the following information to the patient: Information specific to the blood draw procedure. Risks of this procedure may include but are not limited to: risk of infection or bleeding, fainting, bruising, nausea, vomiting, dizziness, diaphoresis (clammy skin), pain or bleeding at site, redness, swelling, or inability to successfully collect needed blood.  Patient consents to blood draw. yes

## 2022-08-12 ENCOUNTER — Ambulatory Visit (INDEPENDENT_AMBULATORY_CARE_PROVIDER_SITE_OTHER): Payer: Medicare PPO

## 2022-08-12 DIAGNOSIS — Z794 Long term (current) use of insulin: Secondary | ICD-10-CM

## 2022-08-12 DIAGNOSIS — I10 Essential (primary) hypertension: Secondary | ICD-10-CM

## 2022-08-12 NOTE — Progress Notes (Signed)
Chronic Care Management Pharmacy Note  09/04/2022 Name:  Anthony Craig MRN:  [id number] DOB:  03/21/1928  Summary: Patient presents today for CCM follow-up.   -Patient overdue for eye exam. Advised patient to schedule with eye doctor.   Recommendations/Changes made from today's visit: Continue current medications  Plan: CCM enrollment status changed to "previously enrolled" as per patient request on 08/12/22 to discontinue enrollment. Case closed to case management services in primary care home.   Subjective: Anthony Craig is an 88 y.o. year old male who is a primary patient of Rosanna Randy Retia Passe., MD.  The CCM team was consulted for assistance with disease management and care coordination needs.    Engaged with patient by telephone for follow up visit in response to provider referral for pharmacy case management and/or care coordination services.   Consent to Services:  The patient was given information about Chronic Care Management services, agreed to services, and gave verbal consent prior to initiation of services.  Please see initial visit note for detailed documentation.   Patient Care Team: Jerrol Banana., MD as PCP - General (Family Medicine) Benito Mccreedy, MD as Referring Physician (Family Medicine) Dingeldein, Remo Lipps, MD (Ophthalmology) Gabriel Carina Betsey Holiday, MD as Physician Assistant (Endocrinology) Germaine Pomfret, [facility] as Pharmacist (Pharmacist)  Recent office visits: 12/16/21: Patient presnted to Dr. Rosanna Randy for follow-up.   Recent consult visits: None in past 6 months  Hospital visits: None in previous 6 months   Objective:  Lab Results  Component Value Date   CREATININE 0.90 05/09/2022   BUN 18 05/09/2022   GFRNONAA >60 05/09/2022   GFRAA 80 07/16/2020   NA 140 05/09/2022   K 4.1 05/09/2022   CALCIUM 9.7 05/09/2022   CO2 29 05/09/2022   GLUCOSE 141 (H) 05/09/2022    Lab Results  Component Value Date/Time   HGBA1C 7.7 (A)  12/16/2021 02:28 PM   HGBA1C 8.0 08/26/2021 12:00 AM   HGBA1C 8.2 (A) 12/10/2020 09:41 AM   HGBA1C 8.4 (H) 07/16/2020 09:15 AM   HGBA1C 7.6 (H) 11/23/2019 03:52 PM   HGBA1C 7.4 03/09/2017 12:00 AM   MICROALBUR 0.782 02/18/2021 12:00 AM    Last diabetic Eye exam:  Lab Results  Component Value Date/Time   HMDIABEYEEXA No Retinopathy 04/24/2020 12:00 AM    Last diabetic Foot exam: No results found for: "HMDIABFOOTEX"   Lab Results  Component Value Date   CHOL 123 08/26/2021   HDL 44 08/26/2021   LDLCALC 59 08/26/2021   TRIG 137 08/26/2021   CHOLHDL 2.6 07/16/2020       Latest Ref Rng & Units 08/26/2021   12:00 AM 07/16/2020    9:15 AM 11/23/2019    3:52 PM  Hepatic Function  Total Protein 6.0 - 8.5 g/dL  7.1  6.8   Albumin 3.5 - 5.0 3.8     4.5  4.4   AST 14 - 40 _0 ALT 10 - 40 _1 Alk Phosphatase 25 - 125 123     93  91   Total Bilirubin 0.0 - 1.2 mg/dL  0.4  0.3      This result is from an external source.    Lab Results  Component Value Date/Time   TSH 1.220 08/17/2019 08:15 AM   TSH 0.711 03/15/2015 04:20 PM   FREET4 1.20 03/15/2015 04:20 PM       Latest Ref  Rng & Units 05/09/2022    6:52 PM 08/26/2021   12:00 AM 07/28/2021    2:48 PM  CBC  WBC 4.0 - 10.5 K/uL 8.4  8.2     6.9   Hemoglobin 13.0 - 17.0 g/dL 13.9  14.8     14.7   Hematocrit 39.0 - 52.0 % 43.5  46     43.0   Platelets 150 - 400 K/uL 182  212     174      This result is from an external source.    Lab Results  Component Value Date/Time   VD25OH 37.9 08/26/2021 12:00 AM    Clinical ASCVD: Yes  The ASCVD Risk score (Arnett DK, et al., 2019) failed to calculate for the following reasons:   The 2019 ASCVD risk score is only valid for ages 19 to 18   The patient has a prior MI or stroke diagnosis       12/18/2021    7:54 AM 12/11/2021    3:04 PM 12/04/2021    7:57 AM  Depression screen PHQ 2/9  Decreased Interest 0 0 0  Down, Depressed, Hopeless 0 0 0  PHQ - 2  Score 0 0 0   Social History   Tobacco Use  Smoking Status Former   Packs/day: 0.50   Years: 30.00   Total pack years: 15.00   Types: Cigarettes   Quit date: 10/06/1979   Years since quitting: 42.9  Smokeless Tobacco Never   BP Readings from Last 3 Encounters:  05/09/22 117/85  02/19/22 (!) 148/89  02/13/22 140/82   Pulse Readings from Last 3 Encounters:  05/09/22 62  02/19/22 68  02/13/22 65   Wt Readings from Last 3 Encounters:  05/09/22 145 lb (65.8 kg)  02/19/22 150 lb (68 kg)  02/13/22 150 lb (68 kg)   BMI Readings from Last 3 Encounters:  05/09/22 20.81 kg/m  02/19/22 21.22 kg/m  02/13/22 20.92 kg/m    Assessment/Interventions: Review of patient past medical history, allergies, medications, health status, including review of consultants reports, laboratory and other test data, was performed as part of comprehensive evaluation and provision of chronic care management services.   SDOH:  (Social Determinants of Health) assessments and interventions performed: Yes SDOH Interventions    Flowsheet Row Clinical Support from 12/11/2021 in [place] Visit from 08/07/2021 in [place] Management from 04/30/2021 in [place] Interventions     Food Insecurity Interventions Intervention Not Indicated -- --  Housing Interventions Intervention Not Indicated -- --  Transportation Interventions Intervention Not Indicated -- --  Depression Interventions/Treatment  -- PHQ2-9 Score <4 Follow-up Not Indicated --  Financial Strain Interventions Intervention Not Indicated -- Intervention Not Indicated  Physical Activity Interventions Intervention Not Indicated -- --  Stress Interventions Intervention Not Indicated -- --  Social Connections Interventions Intervention Not Indicated -- --      SDOH Screenings   Food Insecurity: No Food Insecurity (12/11/2021)  Housing: Low Risk  (12/11/2021)  Transportation  Needs: No Transportation Needs (12/11/2021)  Alcohol Screen: Low Risk  (12/11/2021)  Depression (PHQ2-9): Low Risk  (12/18/2021)  Financial Resource Strain: Low Risk  (12/11/2021)  Physical Activity: Sufficiently Active (12/11/2021)  Social Connections: Moderately Integrated (12/11/2021)  Stress: Stress Concern Present (12/11/2021)  Tobacco Use: Medium Risk (05/09/2022)    CCM Care Plan  Allergies  Allergen Reactions   Doxycycline Other (See Comments)    Reaction: per [place] patient called on 11/30/14  stating he was having a reaction to a medication possibly including redness, swelling and itching, he was not sure at that time if it was this medication or Hydrocodone-homatropin.    Hydrocodone Bit-Homatrop Mbr Other (See Comments)    Reaction:  Reaction: per [place] patient called on 11/30/14 stating he was having a reaction to a medication possibly including redness, swelling and itching, he was not sure at that time if it was this medication or Doxy that he was taking at the same time. Patient has been taking Hydrocodone with no problems.    Medications Reviewed Today     Reviewed by Germaine Pomfret, [facility] (Pharmacist) on 04/29/22 at [place] List Status: <None>   Medication Order Taking? Sig Documenting Provider Last Dose Status Informant  Alcohol Swabs (B-D SINGLE USE SWABS REGULAR) PADS [id number]  USE AS DIRECTED  WITH  EACH  FINGERSTICK Jerrol Banana., MD  Active   amoxicillin (AMOXIL) 875 MG tablet [id number] Yes Take 875 mg by mouth 2 (two) times daily. [provider] Taking Active   aspirin EC 81 MG tablet [id number] Yes Take 81 mg by mouth daily. [provider] Taking Active Self  atorvastatin (LIPITOR) 40 MG tablet [id number] Yes TAKE 1 TABLET BY MOUTH EVERYDAY AT BEDTIME  Patient taking differently: 20 mg.   Jerrol Banana., MD Taking Active            Med Note [facility], TED E   Wed Jun 20, 2020  8:18 PM)    b complex vitamins tablet [id number] Yes  Take 1 tablet by mouth daily. [provider] Taking Active   calcium-vitamin D 250-100 MG-UNIT per tablet [id number] Yes Take 1 tablet by mouth daily. [provider] Taking Active   cholecalciferol (VITAMIN D) 1000 UNITS tablet [id number] Yes Take 1,000 Units by mouth daily. [provider] Taking Active   Coenzyme Q10 (COQ10) 200 MG CAPS [id number] Yes Take 100 mg by mouth daily.  [provider] Taking Active Self  diphenoxylate-atropine (LOMOTIL) 2.5-0.025 MG per tablet [id number] Yes Take 1 tablet by mouth 4 (four) times daily as needed for diarrhea or loose stools. [provider] Taking Active Self  fluticasone (FLONASE) 50 MCG/ACT nasal spray [id number] Yes Place 2 sprays into both nostrils daily. Jerrol Banana., MD Taking Active   Ginkgo Biloba 40 MG TABS [id number] Yes Take 1 tablet by mouth daily with supper. [provider] Taking Active   Glucosamine-Chondroitin (OSTEO BI-FLEX REGULAR STRENGTH PO) [id number] Yes Take 1 tablet by mouth in the morning and at bedtime. [provider] Taking Active   hydrocortisone 2.5 % lotion [id number]  Apply 1 application topically 2 (two) times daily. To affected areas of face only as needed for flares of itching/scaling  [provider]  Active   hydroxypropyl methylcellulose / hypromellose (ISOPTO TEARS / GONIOVISC) 2.5 % ophthalmic solution [id number] Yes Place 1 drop into both eyes 4 (four) times daily as needed for dry eyes. [provider] Taking Active   insulin aspart protamine - aspart (NOVOLOG MIX 70/30 FLEXPEN) (70-30) 100 UNIT/ML FlexPen [id number] Yes 16 units in the am and 7 units after dinner. [provider] Taking Active   lipase/protease/amylase (CREON) 36000 UNITS CPEP capsule [id number] Yes Take 36,000 Units by mouth 3 (three) times daily with meals. [provider] Taking Active   Misc Natural Products [facility] COMPLEX PO) [id number] Yes Take  1 tablet by mouth daily with lunch. [provider] Taking Active  Multiple Vitamins-Minerals (MULTIVITAMIN PO) [id number] Yes Take 1 tablet by mouth daily. [provider] Taking Active Self  Multiple Vitamins-Minerals (PRESERVISION AREDS PO) [id number] Yes Take by mouth.  [provider] Taking Active   Omega-3 Fatty Acids (FISH OIL) 1000 MG CAPS [id number] Yes Take 1 capsule by mouth daily with supper. [provider] Taking Active   omeprazole (PRILOSEC) 20 MG capsule [id number] Yes Take 20 mg by mouth daily. [provider] Taking Active   OVER THE COUNTER MEDICATION [id number] Yes Focus Factor multivitamin 1 tablet by mouth daily at noon [provider] Taking Active   primidone (MYSOLINE) 50 MG tablet [id number]  Take 1 tablet (50 mg total) by mouth 4 (four) times daily.  Patient not taking: Reported on 02/13/2022   Jerrol Banana., MD  Active   ursodiol (ACTIGALL) 300 MG capsule [id number] Yes Take 300 mg by mouth 2 (two) times daily. [provider] Taking Active   vitamin B-12 (CYANOCOBALAMIN) 1000 MCG tablet [id number] Yes Take 5,000 mcg by mouth daily.  [provider] Taking Active Self  vitamin E 180 MG (400 UNITS) capsule [id number] Yes Take 400 Units by mouth daily. [provider] Taking Active   Med List Note Rise Patience, RN 02/13/22 1458): 02/13/22 Pt given signed application for handicapped placard            Patient Active Problem List   Diagnosis Date Noted   Lumbar radiculopathy 08/22/2021   Spinal stenosis, lumbar region, with neurogenic claudication 08/22/2021   Lumbar degenerative disc disease 08/22/2021   Lumbar spondylosis 08/22/2021   Chronic hepatitis ([place]) 08/11/2021   Viral upper respiratory tract infection 11/28/2020   Chest pain 04/27/2015   Allergic drug reaction 02/08/2015   B12 deficiency 02/08/2015   Cervical nerve root disorder 02/08/2015   Chest discomfort  02/08/2015   Vertigo 02/08/2015   Benign essential tremor 02/08/2015   Fatigue 02/08/2015   Acid reflux 02/08/2015   HLD (hyperlipidemia) 02/08/2015   BP (high blood pressure) 02/08/2015   Hernia, inguinal, left 02/08/2015   Cannot sleep 02/08/2015   CA of prostate ([place]) 02/08/2015   Type 2 diabetes mellitus with other circulatory complication, with long-term current use of insulin ([place])    Chronic pancreatitis, unspecified pancreatitis type ([place])     Immunization History  Administered Date(s) Administered   Fluad Quad(high Dose 65+) 07/11/2020, 07/22/2021   Influenza, High Dose Seasonal PF 07/21/2016, 06/14/2019   Influenza, Seasonal, Injecte, Preservative Fre 07/14/2013, 07/05/2014, 07/09/2015   Influenza,inj,Quad PF,6+ Mos 07/01/2017   Influenza,inj,Quad PF,6-35 Mos 07/01/2017   Influenza-Unspecified 07/06/2006, 07/17/2007, 07/06/2008, 08/02/2009, 08/06/2009, 07/12/2012, 07/09/2015, 06/28/2018, 06/07/2019   PFIZER(Purple Top)SARS-COV-2 Vaccination 10/20/2019, 11/08/2019, 09/13/2020   Pneumococcal Conjugate-13 11/08/2013, 11/14/2013   Pneumococcal Polysaccharide-23 06/16/2007, 06/06/2008, 06/08/2010   Pneumococcal-Unspecified 06/06/2008   Td 06/08/2006   Tdap 04/10/2004, 07/01/2017   Zoster Recombinat (Shingrix) 12/04/2020, 06/06/2021    Conditions to be addressed/monitored:  Hypertension, Hyperlipidemia, and Diabetes  Care Plan : General Pharmacy (Adult)  Updates made by Germaine Pomfret, [place] since 09/04/2022 12:00 AM     Problem: Hypertension, Hyperlipidemia, and Diabetes   Priority: High     Long-Range Goal: Patient-Specific Goal   Start Date: 05/03/2021  Expected End Date: 09/04/2022  This Visit's Progress: On track  Recent Progress: On track  Priority: High  Note:   Current Barriers:  No barriers noted  Pharmacist Clinical Goal(s):  Patient will maintain control of diabetes as evidenced by A1c less than 8%  maintain control of  hyperlipidemia as evidenced  by LDL less than 100 through collaboration with PharmD and provider.   Interventions: 1:1 collaboration with Jerrol Banana., MD regarding development and update of comprehensive plan of care as evidenced by provider attestation and co-signature Inter-disciplinary care team collaboration (see longitudinal plan of care) Comprehensive medication review performed; medication list updated in electronic medical record  Hypertension (BP goal <140/90) -Controlled -Current treatment: None -Medications previously tried: NA  -Denies hypotensive symptoms:  -Educated on Daily salt intake goal < 2300 mg; -Counseled on diet and exercise extensively  Hyperlipidemia: (LDL goal < 100) -Controlled -Current treatment: Atorvastatin 40 mg nightly: Appropriate, Effective, Safe, Accessible Lovaza 1g twice daily: Query appropriate -Medications previously tried: NA  -Query appropriate use of lovaza in this 88 year old patient given history of excellent triglyceride control.  -Recommended to continue current medication  Diabetes (A1c goal <8%) -Uncontrolled -[facility]. A1c 8.9%. Managed my [place] endocrinology -Current medications: Novolog 70/30 16 units AM, 7 units PM -Medications previously tried: NA  -Current home glucose readings: Uses Dexcom G6 -Denies hypoglycemic symptoms  -Current exercise: Exercises 3x weekly gym.  -Recommended to continue current medication  Osteoarthritis (Goal: Minimize arthritis pain) -Controlled -Current treatment  Osteo Bi-Flex twice daily  -Medications previously tried: NA -Patient reports supplementation has improved his arthritis pain.  -Recommend Acetaminophen CR 650 mg every 8 hours as needed. Counseled to avoid >3000 mg acetaminophen daily  -Recommended to continue current medication  Patient Goals/Self-Care Activities Patient will:  - check glucose at least four times daily, document, and provide at future appointments check  blood pressure weekly, document, and provide at future appointments  Follow Up Plan: CCM enrollment status changed to "previously enrolled" as per patient request on 08/12/22 to discontinue enrollment. Case closed to case management services in primary care home.     Medication Assistance: None required.  Patient affirms current coverage meets needs.  Compliance/Adherence/Medication fill history: Care Gaps: FOOT EXAM URINE MICROALBUMIN COVID-19 Vaccine Booster 4 Zoster Vaccines- Shingrix  (1st one given on 12/04/2020) OPHTHALMOLOGY EXAM (last completed 04/24/2020)  Star-[facility] Drugs: Atorvastatin 40 mg last filled on 10/11/2019 for a 90-Day supply with [facility]  Patient's preferred pharmacy is:  [facility] [address] [place] [address] Phone: [phone] Fax: [place], [place] [address] Phone: [phone] Fax: [phone]  [id number] [id number]: [phone]: [phone] Uses pill box? Yes Pt endorses 100% compliance  We discussed: Current pharmacy is preferred with insurance plan and patient is satisfied with pharmacy services Patient decided to: Continue current medication management strategy  Care Plan and Follow Up Patient Decision:  Patient agrees to Care Plan and Follow-up.  Plan: CCM enrollment status changed to "previously enrolled" as per patient request on 08/12/22 to discontinue enrollment. Case closed to case management services in primary care home.   AJunius Argyle PharmD, [phone]

## 2022-09-04 DIAGNOSIS — Z794 Long term (current) use of insulin: Secondary | ICD-10-CM

## 2022-09-04 DIAGNOSIS — I10 Essential (primary) hypertension: Secondary | ICD-10-CM

## 2022-09-04 DIAGNOSIS — E1159 Type 2 diabetes mellitus with other circulatory complications: Secondary | ICD-10-CM

## 2022-09-04 NOTE — Patient Instructions (Signed)
Visit Information It was great speaking with you today!  Please let me know if you have any questions about our visit.   Goals Addressed   None     Patient Care Plan: General Pharmacy (Adult)     Problem Identified: Hypertension, Hyperlipidemia, and Diabetes   Priority: High     Long-Range Goal: Patient-Specific Goal   Start Date: 05/03/2021  Expected End Date: 09/04/2022  This Visit's Progress: On track  Recent Progress: On track  Priority: High  Note:   Current Barriers:  No barriers noted  Pharmacist Clinical Goal(s):  Patient will maintain control of diabetes as evidenced by A1c less than 8%  maintain control of hyperlipidemia as evidenced by LDL less than 100 through collaboration with PharmD and provider.   Interventions: 1:1 collaboration with Jerrol Banana., MD regarding development and update of comprehensive plan of care as evidenced by provider attestation and co-signature Inter-disciplinary care team collaboration (see longitudinal plan of care) Comprehensive medication review performed; medication list updated in electronic medical record  Hypertension (BP goal <140/90) -Controlled -Current treatment: None -Medications previously tried: NA  -Denies hypotensive symptoms:  -Educated on Daily salt intake goal < 2300 mg; -Counseled on diet and exercise extensively  Hyperlipidemia: (LDL goal < 100) -Controlled -Current treatment: Atorvastatin 40 mg nightly: Appropriate, Effective, Safe, Accessible Lovaza 1g twice daily: Query appropriate -Medications previously tried: NA  -Query appropriate use of lovaza in this 88 year old patient given history of excellent triglyceride control.  -Recommended to continue current medication  Diabetes (A1c goal <8%) -Uncontrolled -Microalbuminuria noted [facility] 04/18/22. A1c 8.9%. Managed my [place] endocrinology -Current medications: Novolog 70/30 16 units AM, 7 units PM -Medications previously tried: NA  -Current  home glucose readings: Uses Dexcom G6 -Denies hypoglycemic symptoms  -Current exercise: Exercises 3x weekly gym.  -Recommended to continue current medication  Osteoarthritis (Goal: Minimize arthritis pain) -Controlled -Current treatment  Osteo Bi-Flex twice daily  -Medications previously tried: NA -Patient reports supplementation has improved his arthritis pain.  -Recommend Acetaminophen CR 650 mg every 8 hours as needed. Counseled to avoid >3000 mg acetaminophen daily  -Recommended to continue current medication  Patient Goals/Self-Care Activities Patient will:  - check glucose at least four times daily, document, and provide at future appointments check blood pressure weekly, document, and provide at future appointments  Follow Up Plan: CCM enrollment status changed to "previously enrolled" as per patient request on 08/12/22 to discontinue enrollment. Case closed to case management services in primary care home.     Patient agreed to services and verbal consent obtained.   The patient verbalized understanding of instructions, educational materials, and care plan provided today and DECLINED offer to receive copy of patient instructions, educational materials, and care plan.   [name], PharmD, [place], CPP  Clinical Pharmacist Practitioner  [facility] [phone]

## 2022-11-24 ENCOUNTER — Telehealth: Payer: Self-pay | Admitting: Family Medicine

## 2022-11-24 NOTE — Telephone Encounter (Signed)
Called patient to schedule Medicare Annual Wellness Visit (AWV). Left message for patient to call back and schedule Medicare Annual Wellness Visit (AWV).  Last date of AWV: 12/11/2021  Please schedule an appointment at any time with [facility].  If any questions, please contact me at [phone].  Thank you ,  [place] Direct Dial: [phone]

## 2022-12-18 ENCOUNTER — Emergency Department
Admission: EM | Admit: 2022-12-18 | Discharge: 2022-12-18 | Disposition: A | Discharge status: Home / Self Care | Payer: Medicare PPO | Attending: Emergency Medicine | Admitting: Emergency Medicine

## 2022-12-18 ENCOUNTER — Emergency Department: Payer: Medicare PPO

## 2022-12-18 ENCOUNTER — Other Ambulatory Visit: Payer: Self-pay

## 2022-12-18 DIAGNOSIS — R079 Chest pain, unspecified: Secondary | ICD-10-CM | POA: Diagnosis present

## 2022-12-18 DIAGNOSIS — E119 Type 2 diabetes mellitus without complications: Secondary | ICD-10-CM | POA: Insufficient documentation

## 2022-12-18 DIAGNOSIS — I10 Essential (primary) hypertension: Secondary | ICD-10-CM | POA: Diagnosis not present

## 2022-12-18 DIAGNOSIS — I2510 Atherosclerotic heart disease of native coronary artery without angina pectoris: Secondary | ICD-10-CM | POA: Diagnosis not present

## 2022-12-18 LAB — CBC
HCT: 40.5 % (ref 39.0–52.0)
Hemoglobin: 13.2 g/dL (ref 13.0–17.0)
MCH: 27.3 pg (ref 26.0–34.0)
MCHC: 32.6 g/dL (ref 30.0–36.0)
MCV: 83.7 fL (ref 80.0–100.0)
Platelets: 182 10*3/uL (ref 150–400)
RBC: 4.84 MIL/uL (ref 4.22–5.81)
RDW: 14 % (ref 11.5–15.5)
WBC: 5.3 10*3/uL (ref 4.0–10.5)
nRBC: 0 % (ref 0.0–0.2)

## 2022-12-18 LAB — BASIC METABOLIC PANEL
Anion gap: 8 (ref 5–15)
BUN: 14 mg/dL (ref 8–23)
CO2: 29 mmol/L (ref 22–32)
Calcium: 8.7 mg/dL — ABNORMAL LOW (ref 8.9–10.3)
Chloride: 98 mmol/L (ref 98–111)
Creatinine, Ser: 0.84 mg/dL (ref 0.61–1.24)
GFR, Estimated: >60 mL/min (ref >60)
Glucose, Bld: 316 mg/dL — ABNORMAL HIGH (ref 70–99)
Potassium: 3.1 mmol/L — ABNORMAL LOW (ref 3.5–5.1)
Sodium: 135 mmol/L (ref 135–145)

## 2022-12-18 LAB — TROPONIN I (HIGH SENSITIVITY)
Troponin I (High Sensitivity): 9 ng/L (ref <18)
Troponin I (High Sensitivity): 9 ng/L (ref <18)

## 2022-12-18 LAB — CBG MONITORING, ED: Glucose-Capillary: 303 mg/dL — ABNORMAL HIGH (ref 70–99)

## 2022-12-18 NOTE — ED Provider Notes (Signed)
[facility] Provider Note    Event Date/Time   First MD Initiated Contact with Patient 12/18/22 1540     (approximate)  History   Chief Complaint: Chest Pain  HPI  Anthony Craig is a 89 y.o. male with a past medical history of CAD, diabetes, hypertension, hyperlipidemia, status post CABG, presents to the emergency department for chest pain.  According to the patient around noon today he developed a mild pain in the center of his chest.  States it lasted approximately an hour or so however upon arrival to the emergency department he states the pain is completely relieved.  Patient states a history of a CABG around 2006 but has not had any cardiac issues since, no stents placed.  Patient denies any shortness of breath nausea or diaphoresis.  Physical Exam   Triage Vital Signs: ED Triage Vitals  Enc Vitals Group     BP 12/18/22 1426 (!) 176/85     Pulse Rate 12/18/22 1426 (!) 59     Resp 12/18/22 1426 18     Temp 12/18/22 1426 98 F (36.7 C)     Temp Source 12/18/22 1426 Oral     SpO2 12/18/22 1426 96 %     Weight --      Height --      Head Circumference --      Peak Flow --      Pain Score 12/18/22 1417 0     Pain Loc --      Pain Edu? --      Excl. in [place]? --     Most recent vital signs: Vitals:   12/18/22 1426  BP: (!) 176/85  Pulse: (!) 59  Resp: 18  Temp: 98 F (36.7 C)  SpO2: 96%    General: Awake, no distress.  CV:  Good peripheral perfusion.  Regular rate and rhythm  Resp:  Normal effort.  Equal breath sounds bilaterally.  Abd:  No distention.  Soft, nontender.  No rebound or guarding.  ED Results / Procedures / Treatments   EKG  EKG viewed and interpreted by myself shows sinus bradycardia at 59 bpm with a slightly widened QRS, normal axis, slight PR prolongation otherwise normal intervals with nonspecific ST changes, RSR consistent with right bundle branch block.  RADIOLOGY  I reviewed and interpreted chest x-ray  images.  No consolidation seen on my evaluation. Radiology is read the x-ray as negative.   MEDICATIONS ORDERED IN ED: Medications - No data to display   IMPRESSION / MDM / [place] / ED COURSE  I reviewed the triage vital signs and the nursing notes.  Patient's presentation is most consistent with acute presentation with potential threat to life or bodily function.  Patient presents emergency department for chest pain around 12:00 lasting approximately 1 hour and then resolving completely.  Patient denies any chest discomfort currently.  Overall patient appears well reassuring physical exam, mild hypertension otherwise reassuring vital signs.  Chest x-ray is clear, EKG shows no concerning findings.  Patient's lab work shows a normal CBC with a normal white blood cell count, reassuring chemistry with a negative troponin.  Patient's blood sugar is mildly elevated.  We will repeat a troponin as a precaution.  Patient is agreeable to plan of care.  If the patient's repeat troponin is negative anticipate likely discharge home and the patient can follow-up with his cardiologist.  Patient's repeat troponin is negative/unchanged.  Patient continues to deny any chest pain,  states he is feeling well and is ready to go home.  Will discharge patient home have him follow-up with his cardiologist.  Patient and wife agreeable to plan.  FINAL CLINICAL IMPRESSION(S) / ED DIAGNOSES   Chest pain    Note:  This document was prepared using Dragon voice recognition software and may include unintentional dictation errors.   Harvest Dark, MD 12/18/22 1842

## 2022-12-18 NOTE — ED Triage Notes (Signed)
Pt to ED via POV from home. Pt reports sudden onset of right sided CP that radiated to his right shoulder while sitting on the couch. Pt denies CP currently. Pt reports he chewed a super beet pill when CP occurred. 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

## 2023-08-30 ENCOUNTER — Other Ambulatory Visit: Payer: Self-pay

## 2023-08-30 ENCOUNTER — Emergency Department: Payer: No Typology Code available for payment source

## 2023-08-30 ENCOUNTER — Emergency Department
Admission: EM | Admit: 2023-08-30 | Discharge: 2023-08-30 | Disposition: A | Discharge status: Home / Self Care | Payer: No Typology Code available for payment source | Attending: Emergency Medicine | Admitting: Emergency Medicine

## 2023-08-30 DIAGNOSIS — Z951 Presence of aortocoronary bypass graft: Secondary | ICD-10-CM | POA: Insufficient documentation

## 2023-08-30 DIAGNOSIS — R079 Chest pain, unspecified: Secondary | ICD-10-CM | POA: Diagnosis present

## 2023-08-30 DIAGNOSIS — R0789 Other chest pain: Secondary | ICD-10-CM | POA: Insufficient documentation

## 2023-08-30 DIAGNOSIS — R1012 Left upper quadrant pain: Secondary | ICD-10-CM | POA: Insufficient documentation

## 2023-08-30 LAB — BASIC METABOLIC PANEL
Anion gap: 10 (ref 5–15)
BUN: 18 mg/dL (ref 8–23)
CO2: 26 mmol/L (ref 22–32)
Calcium: 9.3 mg/dL (ref 8.9–10.3)
Chloride: 93 mmol/L — ABNORMAL LOW (ref 98–111)
Creatinine, Ser: 0.87 mg/dL (ref 0.61–1.24)
GFR, Estimated: >60 mL/min (ref >60)
Glucose, Bld: 442 mg/dL — ABNORMAL HIGH (ref 70–99)
Potassium: 4.6 mmol/L (ref 3.5–5.1)
Sodium: 129 mmol/L — ABNORMAL LOW (ref 135–145)

## 2023-08-30 LAB — CBC
HCT: 43.6 % (ref 39.0–52.0)
Hemoglobin: 14.7 g/dL (ref 13.0–17.0)
MCH: 28 pg (ref 26.0–34.0)
MCHC: 33.7 g/dL (ref 30.0–36.0)
MCV: 83 fL (ref 80.0–100.0)
Platelets: 167 10*3/uL (ref 150–400)
RBC: 5.25 MIL/uL (ref 4.22–5.81)
RDW: 13.6 % (ref 11.5–15.5)
WBC: 5.8 10*3/uL (ref 4.0–10.5)
nRBC: 0 % (ref 0.0–0.2)

## 2023-08-30 LAB — TROPONIN I (HIGH SENSITIVITY)
Troponin I (High Sensitivity): 13 ng/L (ref <18)
Troponin I (High Sensitivity): 15 ng/L (ref <18)

## 2023-08-30 LAB — BRAIN NATRIURETIC PEPTIDE: B Natriuretic Peptide: 34.5 pg/mL (ref 0.0–100.0)

## 2023-08-30 NOTE — ED Notes (Signed)
Wife and family friend at bedside

## 2023-08-30 NOTE — ED Triage Notes (Signed)
Pt to ED [facility] from home, CP since 3-4 hours ago Hx of same, not seen at hospital (similar episode 2 weeks ago) EMS ekgs: 1st degree av block  Wife has severe dementia, pt also has dementia (per neighbors told to EMS) .DSS is involved, neighbors told EMS pt and wife unable to care for selves  Hx DM, EMS CBG 483 Pt takes rifampin since earlier this month, unable to get full hx from pt/wife  HR 74, 145/66, 97% RA  Pt now denies pain.

## 2023-08-30 NOTE — ED Provider Notes (Signed)
[facility] Provider Note   Event Date/Time   First MD Initiated Contact with Patient 08/30/23 [phone]     (approximate) History  Chest Pain  HPI Anthony Craig is a 89 y.o. male with a stated past medical history three-vessel CABG who presents via EMS complaining of left upper quadrant abdominal pain that radiates through the left chest into the left arm.  Patient states that this pain started approximate 30 minutes prior to arrival and resolved after 15 minutes spontaneously while being loaded into the EMS truck.  Patient currently denies any chest pain ROS: Patient currently denies any vision changes, tinnitus, difficulty speaking, facial droop, sore throat, chest pain, shortness of breath, abdominal pain, nausea/vomiting/diarrhea, dysuria, or weakness/numbness/paresthesias in any extremity   Physical Exam  Triage Vital Signs: ED Triage Vitals  Encounter Vitals Group     BP 08/30/23 1627 (!) 153/83     Systolic BP Percentile --      Diastolic BP Percentile --      Pulse Rate 08/30/23 1627 74     Resp 08/30/23 1627 20     Temp 08/30/23 1628 97.6 F (36.4 C)     Temp Source 08/30/23 1628 Oral     SpO2 08/30/23 1627 100 %     Weight 08/30/23 1626 133 lb (60.3 kg)     Height 08/30/23 1626 5\' 10"  (1.778 m)     Head Circumference --      Peak Flow --      Pain Score 08/30/23 1627 0     Pain Loc --      Pain Education --      Exclude from Growth Chart --    Most recent vital signs: Vitals:   08/30/23 1930 08/30/23 2000  BP: 134/72 122/71  Pulse:  68  Resp:  (!) 25  Temp:    SpO2:  98%   General: Awake, oriented x4 CV:  Good peripheral perfusion.  Resp:  Normal effort.  Abd:  No distention.  Other:  Elderly well-developed, well-nourished Caucasian male resting comfortably in no acute distress ED Results / Procedures / Treatments  Labs (all labs ordered are listed, but only abnormal results are displayed) Labs Reviewed  BASIC METABOLIC PANEL  - Abnormal; Notable for the following components:      Result Value   Sodium 129 (*)    Chloride 93 (*)    Glucose, Bld 442 (*)    All other components within normal limits  CBC  BRAIN NATRIURETIC PEPTIDE  TROPONIN I (HIGH SENSITIVITY)  TROPONIN I (HIGH SENSITIVITY)   EKG ED ECG REPORT I, Merwyn Katos, the attending physician, personally viewed and interpreted this ECG. Date: 08/30/2023 EKG Time: 1632 Rate: 74 Rhythm: normal sinus rhythm QRS Axis: normal Intervals: Right bundle branch block ST/T Wave abnormalities: normal Narrative Interpretation: Normal sinus rhythm with RBBB.  No evidence of acute ischemia RADIOLOGY ED MD interpretation: 2 view chest x-ray interpreted independently and shows no evidence of acute abnormalities -Agree with radiology assessment Official radiology report(s): DG Chest 2 View  Result Date: 08/30/2023 CLINICAL DATA:  Chest pain EXAM: CHEST - 2 VIEW COMPARISON:  12/18/2022 FINDINGS: The lungs are clear without focal pneumonia, edema, pneumothorax or pleural effusion. Stable tiny nodule left upper lung, compatible with calcified granuloma. The cardiopericardial silhouette is within normal limits for size. Bones are diffusely demineralized. Telemetry leads overlie the chest. IMPRESSION: No active cardiopulmonary disease. Electronically Signed   By: Jamison Oka.D.  On: 08/30/2023 17:52   PROCEDURES: Critical Care performed: No .1-3 Lead EKG Interpretation  Performed by: Merwyn Katos, MD Authorized by: Merwyn Katos, MD     Interpretation: normal     ECG rate:  71   ECG rate assessment: normal     Rhythm: sinus rhythm     Ectopy: none     Conduction: normal   Comments:     RBBB  MEDICATIONS ORDERED IN ED: Medications - No data to display IMPRESSION / MDM / ASSESSMENT AND PLAN / ED COURSE  I reviewed the triage vital signs and the nursing notes.                             The patient is on the cardiac monitor to evaluate for  evidence of arrhythmia and/or significant heart rate changes. Patient's presentation is most consistent with acute presentation with potential threat to life or bodily function. This patient presents with atypical chest pain, most likely secondary to musculoskeletal injury. Differential diagnosis includes rib fracture, costochondritis, sternal fracture. Low suspicion for ACS, acute PE (PERC negative), pericarditis / myocarditis, thoracic aortic dissection, pneumothorax, pneumonia or other acute infectious process. Presentation not consistent with other acute, emergent causes of chest pain at this time. No indication for cardiac enzyme testing. Plan to order CXR to evaluate for acute cardiopulmonary causes.  Plan: EKG, CXR, pain control  Dispo: Discharge home with home care   FINAL CLINICAL IMPRESSION(S) / ED DIAGNOSES   Final diagnoses:  Chest pain, unspecified type   Rx / DC Orders   ED Discharge Orders     None      Note:  This document was prepared using Dragon voice recognition software and may include unintentional dictation errors.   Merwyn Katos, MD 08/30/23 [phone]

## 2024-04-16 ENCOUNTER — Emergency Department
Admission: EM | Admit: 2024-04-16 | Discharge: 2024-04-16 | Disposition: A | Discharge status: Home / Self Care | Attending: Emergency Medicine | Admitting: Emergency Medicine

## 2024-04-16 ENCOUNTER — Emergency Department

## 2024-04-16 DIAGNOSIS — G309 Alzheimer's disease, unspecified: Secondary | ICD-10-CM | POA: Insufficient documentation

## 2024-04-16 DIAGNOSIS — Z7982 Long term (current) use of aspirin: Secondary | ICD-10-CM | POA: Diagnosis not present

## 2024-04-16 DIAGNOSIS — Z23 Encounter for immunization: Secondary | ICD-10-CM | POA: Insufficient documentation

## 2024-04-16 DIAGNOSIS — E119 Type 2 diabetes mellitus without complications: Secondary | ICD-10-CM | POA: Diagnosis not present

## 2024-04-16 DIAGNOSIS — R519 Headache, unspecified: Secondary | ICD-10-CM | POA: Diagnosis present

## 2024-04-16 DIAGNOSIS — W06XXXA Fall from bed, initial encounter: Secondary | ICD-10-CM | POA: Diagnosis not present

## 2024-04-16 DIAGNOSIS — W19XXXA Unspecified fall, initial encounter: Secondary | ICD-10-CM

## 2024-04-16 DIAGNOSIS — Z8507 Personal history of malignant neoplasm of pancreas: Secondary | ICD-10-CM | POA: Insufficient documentation

## 2024-04-16 DIAGNOSIS — Z951 Presence of aortocoronary bypass graft: Secondary | ICD-10-CM | POA: Insufficient documentation

## 2024-04-16 DIAGNOSIS — I10 Essential (primary) hypertension: Secondary | ICD-10-CM | POA: Diagnosis not present

## 2024-04-16 DIAGNOSIS — Z794 Long term (current) use of insulin: Secondary | ICD-10-CM | POA: Insufficient documentation

## 2024-04-16 DIAGNOSIS — I2510 Atherosclerotic heart disease of native coronary artery without angina pectoris: Secondary | ICD-10-CM | POA: Diagnosis not present

## 2024-04-16 DIAGNOSIS — S0990XA Unspecified injury of head, initial encounter: Secondary | ICD-10-CM

## 2024-04-16 DIAGNOSIS — S0083XA Contusion of other part of head, initial encounter: Secondary | ICD-10-CM | POA: Diagnosis not present

## 2024-04-16 MED ORDER — ACETAMINOPHEN 500 MG PO TABS
1000.0000 mg | ORAL_TABLET | Freq: Once | ORAL | Status: AC
  Administered 2024-04-16: 1000 mg via ORAL
  Filled 2024-04-16: qty 2

## 2024-04-16 MED ORDER — TETANUS-DIPHTH-ACELL PERTUSSIS 5-2.5-18.5 LF-MCG/0.5 IM SUSY
0.5000 mL | PREFILLED_SYRINGE | Freq: Once | INTRAMUSCULAR | Status: AC
  Administered 2024-04-16: 0.5 mL via INTRAMUSCULAR
  Filled 2024-04-16: qty 0.5

## 2024-04-16 NOTE — ED Triage Notes (Signed)
 BIB EMS for a ground level fall tonight when getting out of bed. Not on blood thinners. Swelling and small area of bleeding noted on the left side of forehead and a small skin tear on the left arm. C/O a headache at this time.

## 2024-04-16 NOTE — ED Provider Notes (Signed)
 [facility] Provider Note    Event Date/Time   First MD Initiated Contact with Patient 04/16/24 [phone]     (approximate)   History   Fall   HPI  Anthony Craig is a 89 y.o. male with history of hypertension, diabetes, hyperlipidemia, Alzheimer's dementia who presents to the emergency department from his nursing facility after he had an unwitnessed fall.  Has an abrasion to the forehead.  He states he is not sure why he fell.  He is complaining of headache but no other discomfort.  Unsure of his last tetanus vaccine.  Not on blood thinners.  Per staff, patient fell when trying to get out of bed tonight.   History provided by patient, EMS.    Past Medical History:  Diagnosis Date   Cancer [facility])    pancreatic   Coronary artery disease    Diabetes mellitus without complication (HCC)    type 2   Hyperlipidemia    Hypertension     Past Surgical History:  Procedure Laterality Date   APPENDECTOMY     1977   BILIARY DRAINAGE     CORONARY ARTERY BYPASS GRAFT     2001-3 vessel   HERNIA REPAIR Bilateral    2016-left, 2009-right   WHIPPLE PROCEDURE  2007    MEDICATIONS:  Prior to Admission medications   Medication Sig Start Date End Date Taking? Authorizing Provider  Alcohol Swabs (B-D SINGLE USE SWABS REGULAR) PADS USE AS DIRECTED  WITH  EACH  FINGERSTICK 12/24/15   Bertrum Charlie CROME, MD  aspirin  EC 81 MG tablet Take 81 mg by mouth daily.    [provider]  atorvastatin  (LIPITOR) 40 MG tablet TAKE 1 TABLET BY MOUTH EVERYDAY AT BEDTIME Patient taking differently: 20 mg. 12/26/18   Bertrum Charlie CROME, MD  b complex vitamins tablet Take 1 tablet by mouth daily.    [provider]  calcium -vitamin D 250-100 MG-UNIT per tablet Take 1 tablet by mouth daily.    [provider]  cholecalciferol (VITAMIN D) 1000 UNITS tablet Take 1,000 Units by mouth daily.    [provider]  Coenzyme Q10 (COQ10) 200 MG CAPS Take 100  mg by mouth daily.     [provider]  diphenoxylate-atropine (LOMOTIL) 2.5-0.025 MG per tablet Take 1 tablet by mouth 4 (four) times daily as needed for diarrhea or loose stools.    [provider]  fluticasone  (FLONASE ) 50 MCG/ACT nasal spray Place 2 sprays into both nostrils daily. 12/16/21   Bertrum Charlie CROME, MD  Ginkgo Biloba 40 MG TABS Take 1 tablet by mouth daily with supper.    [provider]  Glucosamine-Chondroitin (OSTEO BI-FLEX REGULAR STRENGTH PO) Take 1 tablet by mouth in the morning and at bedtime.    [provider]  hydrocortisone 2.5 % lotion Apply 1 application topically 2 (two) times daily. To affected areas of face only as needed for flares of itching/scaling     [provider]  hydroxypropyl methylcellulose / hypromellose (ISOPTO TEARS / GONIOVISC) 2.5 % ophthalmic solution Place 1 drop into both eyes 4 (four) times daily as needed for dry eyes.    [provider]  insulin  aspart protamine - aspart (NOVOLOG  MIX 70/30 FLEXPEN) (70-30) 100 UNIT/ML FlexPen 16 units in the am and 7 units after dinner. 11/12/20   [provider]  lipase/protease/amylase (CREON ) 36000 UNITS CPEP capsule Take 36,000 Units by mouth 3 (three) times daily with meals.    [provider]  Misc Natural Products (GINSENG COMPLEX PO) Take 1 tablet by mouth daily with lunch.    [provider]  Multiple Vitamins-Minerals (MULTIVITAMIN PO) Take 1 tablet by mouth daily.    [provider]  Multiple Vitamins-Minerals (PRESERVISION AREDS PO) Take by mouth.     [provider]  Omega-3 Fatty Acids (FISH OIL) 1000 MG CAPS Take 1 capsule by mouth daily with supper.    [provider]  omeprazole  (PRILOSEC) 20 MG capsule Take 20 mg by mouth daily. 11/05/21   [provider]  OVER THE COUNTER MEDICATION Focus Factor multivitamin 1 tablet by mouth daily at noon    [provider]  primidone   (MYSOLINE ) 50 MG tablet Take 1 tablet (50 mg total) by mouth 4 (four) times daily. Patient not taking: Reported on 02/13/2022 12/02/16   Bertrum Charlie CROME, MD  ursodiol (ACTIGALL) 300 MG capsule Take 300 mg by mouth 2 (two) times daily.    [provider]  vitamin B-12 (CYANOCOBALAMIN) 1000 MCG tablet Take 5,000 mcg by mouth daily.     [provider]  vitamin E 180 MG (400 UNITS) capsule Take 400 Units by mouth daily.    [provider]    Physical Exam   Triage Vital Signs: ED Triage Vitals  Encounter Vitals Group     BP 04/16/24 0246 (!) 182/77     Girls Systolic BP Percentile --      Girls Diastolic BP Percentile --      Boys Systolic BP Percentile --      Boys Diastolic BP Percentile --      Pulse Rate 04/16/24 0244 64     Resp 04/16/24 0244 18     Temp 04/16/24 0244 97.8 F (36.6 C)     Temp Source 04/16/24 0244 Oral     SpO2 04/16/24 0244 100 %     Weight --      Height --      Head Circumference --      Peak Flow --      Pain Score --      Pain Loc --      Pain Education --      Exclude from Growth Chart --     Most recent vital signs: Vitals:   04/16/24 0430 04/16/24 0518  BP: (!) 192/95 (!) 178/84  Pulse: (!) 59   Resp:  18  Temp:    SpO2: 100%      CONSTITUTIONAL: Alert, responds appropriately to questions. Well-appearing; well-nourished; GCS 15, elderly, thin, smiling and laughing HEAD: Normocephalic; small hematoma and abrasion to the left forehead EYES: Conjunctivae clear, PERRL, EOMI ENT: normal nose; no rhinorrhea; moist mucous membranes; pharynx without lesions noted; no dental injury; no septal hematoma, no epistaxis; no facial deformity or bony tenderness NECK: Supple, no midline spinal tenderness, step-off or deformity; trachea midline CARD: RRR; S1 and S2 appreciated; no murmurs, no clicks, no rubs, no gallops RESP: Normal chest excursion without splinting or tachypnea; breath sounds clear and equal bilaterally; no  wheezes, no rhonchi, no rales; no hypoxia or respiratory distress CHEST:  chest wall stable, no crepitus or ecchymosis or deformity, nontender to palpation; no flail chest ABD/GI: Non-distended; soft, non-tender, no rebound, no guarding; no ecchymosis or other lesions noted PELVIS:  stable, nontender to palpation BACK:  The back appears normal; no midline spinal tenderness, step-off or deformity EXT: Normal ROM in all joints; no edema; normal capillary refill; no cyanosis, no bony  tenderness or bony deformity of patient's extremities, no joint effusions, compartments are soft, extremities are warm and well-perfused, no ecchymosis SKIN: Normal color for age and race; warm NEURO: No facial asymmetry, normal speech, moving all extremities equally  ED Results / Procedures / Treatments   LABS: (all labs ordered are listed, but only abnormal results are displayed) Labs Reviewed - No data to display   EKG:    RADIOLOGY: My personal review and interpretation of imaging: CT head and cervical spine show no acute traumatic injury other than soft tissue hematoma.  I have personally reviewed all radiology reports. CT HEAD WO CONTRAST ( ) Result Date: 04/16/2024 CLINICAL DATA:  Recent fall with headaches and neck pain, initial encounter EXAM: CT HEAD WITHOUT CONTRAST CT CERVICAL SPINE WITHOUT CONTRAST TECHNIQUE: Multidetector CT imaging of the head and cervical spine was performed following the standard protocol without intravenous contrast. Multiplanar CT image reconstructions of the cervical spine were also generated. RADIATION DOSE REDUCTION: This exam was performed according to the departmental dose-optimization program which includes automated exposure control, adjustment of the mA and/or kV according to patient size and/or use of iterative reconstruction technique. COMPARISON:  None Available. FINDINGS: CT HEAD FINDINGS Brain: No evidence of acute infarction, hemorrhage, hydrocephalus, extra-axial  collection or mass lesion/mass effect. Mild atrophic changes are identified Vascular: No hyperdense vessel or unexpected calcification. Skull: Normal. Negative for fracture or focal lesion. Sinuses/Orbits: No acute finding. Other: Frontal scalp hematoma is noted on the left consistent with the recent injury. CT CERVICAL SPINE FINDINGS Alignment: Within normal limits. Skull base and vertebrae: 7 cervical segments are well visualized. Vertebral body height is well maintained. Osteophytic changes and facet hypertrophic changes are noted throughout. No acute fracture or acute facet abnormality is noted. The odontoid is within normal limits. Soft tissues and spinal canal: Surrounding soft tissue structures are within normal limits. Upper chest: Visualized lung apices are within normal limits. Other: None IMPRESSION: CT of the head: Mild atrophic changes without acute intracranial abnormality. Mild scalp hematoma on the left. CT of the cervical spine: Multilevel degenerative change without acute abnormality. Electronically Signed   By: Oneil Devonshire M.D.   On: 04/16/2024 03:51   CT Cervical Spine Wo Contrast Result Date: 04/16/2024 CLINICAL DATA:  Recent fall with headaches and neck pain, initial encounter EXAM: CT HEAD WITHOUT CONTRAST CT CERVICAL SPINE WITHOUT CONTRAST TECHNIQUE: Multidetector CT imaging of the head and cervical spine was performed following the standard protocol without intravenous contrast. Multiplanar CT image reconstructions of the cervical spine were also generated. RADIATION DOSE REDUCTION: This exam was performed according to the departmental dose-optimization program which includes automated exposure control, adjustment of the mA and/or kV according to patient size and/or use of iterative reconstruction technique. COMPARISON:  None Available. FINDINGS: CT HEAD FINDINGS Brain: No evidence of acute infarction, hemorrhage, hydrocephalus, extra-axial collection or mass lesion/mass effect. Mild  atrophic changes are identified Vascular: No hyperdense vessel or unexpected calcification. Skull: Normal. Negative for fracture or focal lesion. Sinuses/Orbits: No acute finding. Other: Frontal scalp hematoma is noted on the left consistent with the recent injury. CT CERVICAL SPINE FINDINGS Alignment: Within normal limits. Skull base and vertebrae: 7 cervical segments are well visualized. Vertebral body height is well maintained. Osteophytic changes and facet hypertrophic changes are noted throughout. No acute fracture or acute facet abnormality is noted. The odontoid is within normal limits. Soft tissues and spinal canal: Surrounding soft tissue structures are within normal limits. Upper chest: Visualized lung apices are within normal limits.  Other: None IMPRESSION: CT of the head: Mild atrophic changes without acute intracranial abnormality. Mild scalp hematoma on the left. CT of the cervical spine: Multilevel degenerative change without acute abnormality. Electronically Signed   By: Oneil Devonshire M.D.   On: 04/16/2024 03:51     PROCEDURES:  Critical Care performed: No   Procedures    IMPRESSION / MDM / ASSESSMENT AND PLAN / ED COURSE  I reviewed the triage vital signs and the nursing notes.  Patient here after unwitnessed fall from his nursing facility.  Complaining of headache and has a small hematoma.  No other sign of injury on exam.     DIFFERENTIAL DIAGNOSIS (includes but not limited to):   Unwitnessed fall, hematoma, intracranial hemorrhage, skull fracture, cervical spine fracture  Patient's presentation is most consistent with acute presentation with potential threat to life or bodily function.  PLAN: Will obtain CT head and cervical spine.  Will give Tylenol , update tetanus vaccine.  No other signs of injury.   MEDICATIONS GIVEN IN ED: Medications  acetaminophen  (TYLENOL ) tablet 1,000 mg (1,000 mg Oral Given 04/16/24 0412)  Tdap (BOOSTRIX) injection 0.5 mL (0.5 mLs  Intramuscular Given 04/16/24 0413)     ED COURSE: CT scans reviewed and interpreted by myself and the radiologist and are unremarkable.  Patient is well-appearing, hemodynamically stable, moving all extremities.  Has no other complaints.  I feel he is safe to be discharged back to his nursing facility.   At this time, I do not feel there is any life-threatening condition present. I reviewed all nursing notes, vitals, pertinent previous records.  All lab and urine results, EKGs, imaging ordered have been independently reviewed and interpreted by myself.  I reviewed all available radiology reports from any imaging ordered this visit.  Based on my assessment, I feel the patient is safe to be discharged home without further emergent workup and can continue workup as an outpatient as needed. Discussed all findings, treatment plan as well as usual and customary return precautions.  They verbalize understanding and are comfortable with this plan.  Outpatient follow-up has been provided as needed.  All questions have been answered.    CONSULTS:  none   OUTSIDE RECORDS REVIEWED: Reviewed recent [facility].       FINAL CLINICAL IMPRESSION(S) / ED DIAGNOSES   Final diagnoses:  Fall, initial encounter  Injury of head, initial encounter     Rx / DC Orders   ED Discharge Orders     None        Note:  This document was prepared using Dragon voice recognition software and may include unintentional dictation errors.   Russel Morain, Josette SAILOR, DO 04/16/24 [phone]

## 2024-04-16 NOTE — Discharge Instructions (Signed)
 CT of your head and cervical spine showed no injury other than a soft tissue hematoma.

## 2024-07-02 ENCOUNTER — Emergency Department

## 2024-07-02 ENCOUNTER — Encounter: Payer: Self-pay | Admitting: Emergency Medicine

## 2024-07-02 ENCOUNTER — Inpatient Hospital Stay
Admission: EM | Admit: 2024-07-02 | Discharge: 2024-07-08 | DRG: 871 | Disposition: A | Discharge status: Skilled Nursing Facility | Source: Skilled Nursing Facility | Attending: Family Medicine | Admitting: Family Medicine

## 2024-07-02 ENCOUNTER — Other Ambulatory Visit: Payer: Self-pay

## 2024-07-02 DIAGNOSIS — R509 Fever, unspecified: Secondary | ICD-10-CM

## 2024-07-02 DIAGNOSIS — A4189 Other specified sepsis: Secondary | ICD-10-CM | POA: Diagnosis not present

## 2024-07-02 DIAGNOSIS — Z951 Presence of aortocoronary bypass graft: Secondary | ICD-10-CM

## 2024-07-02 DIAGNOSIS — Z993 Dependence on wheelchair: Secondary | ICD-10-CM

## 2024-07-02 DIAGNOSIS — Z885 Allergy status to narcotic agent status: Secondary | ICD-10-CM

## 2024-07-02 DIAGNOSIS — E785 Hyperlipidemia, unspecified: Secondary | ICD-10-CM | POA: Diagnosis present

## 2024-07-02 DIAGNOSIS — G250 Essential tremor: Secondary | ICD-10-CM | POA: Diagnosis present

## 2024-07-02 DIAGNOSIS — Z7982 Long term (current) use of aspirin: Secondary | ICD-10-CM

## 2024-07-02 DIAGNOSIS — Z8507 Personal history of malignant neoplasm of pancreas: Secondary | ICD-10-CM

## 2024-07-02 DIAGNOSIS — E860 Dehydration: Secondary | ICD-10-CM | POA: Diagnosis present

## 2024-07-02 DIAGNOSIS — E876 Hypokalemia: Secondary | ICD-10-CM | POA: Diagnosis present

## 2024-07-02 DIAGNOSIS — F0390 Unspecified dementia without behavioral disturbance: Secondary | ICD-10-CM | POA: Diagnosis present

## 2024-07-02 DIAGNOSIS — I2510 Atherosclerotic heart disease of native coronary artery without angina pectoris: Secondary | ICD-10-CM | POA: Diagnosis present

## 2024-07-02 DIAGNOSIS — Z7401 Bed confinement status: Secondary | ICD-10-CM

## 2024-07-02 DIAGNOSIS — Z833 Family history of diabetes mellitus: Secondary | ICD-10-CM

## 2024-07-02 DIAGNOSIS — Z8546 Personal history of malignant neoplasm of prostate: Secondary | ICD-10-CM

## 2024-07-02 DIAGNOSIS — G9340 Encephalopathy, unspecified: Secondary | ICD-10-CM | POA: Diagnosis present

## 2024-07-02 DIAGNOSIS — E871 Hypo-osmolality and hyponatremia: Secondary | ICD-10-CM | POA: Diagnosis present

## 2024-07-02 DIAGNOSIS — K861 Other chronic pancreatitis: Secondary | ICD-10-CM | POA: Diagnosis present

## 2024-07-02 DIAGNOSIS — U071 COVID-19: Secondary | ICD-10-CM | POA: Diagnosis present

## 2024-07-02 DIAGNOSIS — I259 Chronic ischemic heart disease, unspecified: Secondary | ICD-10-CM | POA: Insufficient documentation

## 2024-07-02 DIAGNOSIS — R339 Retention of urine, unspecified: Secondary | ICD-10-CM | POA: Diagnosis present

## 2024-07-02 DIAGNOSIS — Z79899 Other long term (current) drug therapy: Secondary | ICD-10-CM

## 2024-07-02 DIAGNOSIS — K219 Gastro-esophageal reflux disease without esophagitis: Secondary | ICD-10-CM | POA: Diagnosis not present

## 2024-07-02 DIAGNOSIS — Z8249 Family history of ischemic heart disease and other diseases of the circulatory system: Secondary | ICD-10-CM

## 2024-07-02 DIAGNOSIS — K739 Chronic hepatitis, unspecified: Secondary | ICD-10-CM | POA: Diagnosis present

## 2024-07-02 DIAGNOSIS — E1159 Type 2 diabetes mellitus with other circulatory complications: Secondary | ICD-10-CM | POA: Diagnosis present

## 2024-07-02 DIAGNOSIS — G9341 Metabolic encephalopathy: Secondary | ICD-10-CM | POA: Diagnosis present

## 2024-07-02 DIAGNOSIS — I10 Essential (primary) hypertension: Secondary | ICD-10-CM | POA: Diagnosis present

## 2024-07-02 DIAGNOSIS — R4182 Altered mental status, unspecified: Principal | ICD-10-CM

## 2024-07-02 DIAGNOSIS — Z66 Do not resuscitate: Secondary | ICD-10-CM | POA: Diagnosis present

## 2024-07-02 DIAGNOSIS — R4181 Age-related cognitive decline: Secondary | ICD-10-CM | POA: Insufficient documentation

## 2024-07-02 DIAGNOSIS — E872 Acidosis, unspecified: Secondary | ICD-10-CM | POA: Diagnosis present

## 2024-07-02 DIAGNOSIS — C61 Malignant neoplasm of prostate: Secondary | ICD-10-CM | POA: Diagnosis not present

## 2024-07-02 DIAGNOSIS — Z794 Long term (current) use of insulin: Secondary | ICD-10-CM

## 2024-07-02 DIAGNOSIS — Z87891 Personal history of nicotine dependence: Secondary | ICD-10-CM

## 2024-07-02 DIAGNOSIS — M48062 Spinal stenosis, lumbar region with neurogenic claudication: Secondary | ICD-10-CM | POA: Diagnosis present

## 2024-07-02 DIAGNOSIS — E7800 Pure hypercholesterolemia, unspecified: Secondary | ICD-10-CM

## 2024-07-02 DIAGNOSIS — R6520 Severe sepsis without septic shock: Secondary | ICD-10-CM | POA: Diagnosis present

## 2024-07-02 DIAGNOSIS — Z881 Allergy status to other antibiotic agents status: Secondary | ICD-10-CM

## 2024-07-02 DIAGNOSIS — E1165 Type 2 diabetes mellitus with hyperglycemia: Secondary | ICD-10-CM | POA: Diagnosis present

## 2024-07-02 DIAGNOSIS — I2489 Other forms of acute ischemic heart disease: Secondary | ICD-10-CM | POA: Diagnosis present

## 2024-07-02 LAB — CBC WITH DIFFERENTIAL/PLATELET
Abs Immature Granulocytes: 0.04 K/uL (ref 0.00–0.07)
Basophils Absolute: 0 K/uL (ref 0.0–0.1)
Basophils Relative: 0 %
Eosinophils Absolute: 0 K/uL (ref 0.0–0.5)
Eosinophils Relative: 0 %
HCT: 39.2 % (ref 39.0–52.0)
Hemoglobin: 13 g/dL (ref 13.0–17.0)
Immature Granulocytes: 1 %
Lymphocytes Relative: 5 %
Lymphs Abs: 0.4 K/uL — ABNORMAL LOW (ref 0.7–4.0)
MCH: 26.7 pg (ref 26.0–34.0)
MCHC: 33.2 g/dL (ref 30.0–36.0)
MCV: 80.7 fL (ref 80.0–100.0)
Monocytes Absolute: 0.3 K/uL (ref 0.1–1.0)
Monocytes Relative: 4 %
Neutro Abs: 6.9 K/uL (ref 1.7–7.7)
Neutrophils Relative %: 90 %
Platelets: 176 K/uL (ref 150–400)
RBC: 4.86 MIL/uL (ref 4.22–5.81)
RDW: 13.2 % (ref 11.5–15.5)
WBC: 7.7 K/uL (ref 4.0–10.5)
nRBC: 0 % (ref 0.0–0.2)

## 2024-07-02 LAB — COMPREHENSIVE METABOLIC PANEL WITH GFR
ALT: 12 U/L (ref 0–44)
AST: 25 U/L (ref 15–41)
Albumin: 3.6 g/dL (ref 3.5–5.0)
Alkaline Phosphatase: 99 U/L (ref 38–126)
Anion gap: 10 (ref 5–15)
BUN: 13 mg/dL (ref 8–23)
CO2: 31 mmol/L (ref 22–32)
Calcium: 8.4 mg/dL — ABNORMAL LOW (ref 8.9–10.3)
Chloride: 92 mmol/L — ABNORMAL LOW (ref 98–111)
Creatinine, Ser: 1.05 mg/dL (ref 0.61–1.24)
GFR, Estimated: >60 mL/min (ref >60)
Glucose, Bld: 221 mg/dL — ABNORMAL HIGH (ref 70–99)
Potassium: 2.9 mmol/L — ABNORMAL LOW (ref 3.5–5.1)
Sodium: 133 mmol/L — ABNORMAL LOW (ref 135–145)
Total Bilirubin: 0.7 mg/dL (ref 0.0–1.2)
Total Protein: 7 g/dL (ref 6.5–8.1)

## 2024-07-02 LAB — RESP PANEL BY RT-PCR (RSV, FLU A&B, COVID)  RVPGX2
Influenza A by PCR: NEGATIVE
Influenza B by PCR: NEGATIVE
Resp Syncytial Virus by PCR: NEGATIVE
SARS Coronavirus 2 by RT PCR: POSITIVE — AB

## 2024-07-02 LAB — PROTIME-INR
INR: 1 (ref 0.8–1.2)
Prothrombin Time: 14.1 s (ref 11.4–15.2)

## 2024-07-02 LAB — CBG MONITORING, ED: Glucose-Capillary: 204 mg/dL — ABNORMAL HIGH (ref 70–99)

## 2024-07-02 LAB — HEMOGLOBIN A1C
Hgb A1c MFr Bld: 9.5 % — ABNORMAL HIGH (ref 4.8–5.6)
Mean Plasma Glucose: 225.95 mg/dL

## 2024-07-02 LAB — TROPONIN I (HIGH SENSITIVITY): Troponin I (High Sensitivity): 428 ng/L (ref <18)

## 2024-07-02 LAB — LACTIC ACID, PLASMA
Lactic Acid, Venous: 2.1 mmol/L (ref 0.5–1.9)
Lactic Acid, Venous: 1.3 mmol/L (ref 0.5–1.9)

## 2024-07-02 LAB — MAGNESIUM: Magnesium: 1.5 mg/dL — ABNORMAL LOW (ref 1.7–2.4)

## 2024-07-02 MED ORDER — SODIUM CHLORIDE 0.9 % IV BOLUS
1000.0000 mL | Freq: Once | INTRAVENOUS | Status: AC
Start: 2024-07-02 — End: 2024-07-02
  Administered 2024-07-02: 1000 mL via INTRAVENOUS

## 2024-07-02 MED ORDER — PANCRELIPASE (LIP-PROT-AMYL) 12000-38000 UNITS PO CPEP
36000.0000 [IU] | ORAL_CAPSULE | Freq: Three times a day (TID) | ORAL | Status: DC
  Administered 2024-07-03 – 2024-07-08 (×14): 36000 [IU] via ORAL
  Filled 2024-07-02 (×8): qty 3
  Filled 2024-07-02: qty 1
  Filled 2024-07-02 (×7): qty 3

## 2024-07-02 MED ORDER — REMDESIVIR 100 MG IV SOLR
100.0000 mg | Freq: Every day | INTRAVENOUS | Status: DC
  Administered 2024-07-03: 100 mg via INTRAVENOUS
  Filled 2024-07-02: qty 20

## 2024-07-02 MED ORDER — ENOXAPARIN SODIUM 40 MG/0.4ML IJ SOSY
40.0000 mg | PREFILLED_SYRINGE | INTRAMUSCULAR | Status: DC
  Administered 2024-07-02 – 2024-07-07 (×6): 40 mg via SUBCUTANEOUS
  Filled 2024-07-02 (×6): qty 0.4

## 2024-07-02 MED ORDER — POLYETHYLENE GLYCOL 3350 17 G PO PACK: 17.0000 g | PACK | Freq: Every day | ORAL | Status: DC | PRN

## 2024-07-02 MED ORDER — SODIUM CHLORIDE 0.9% FLUSH
3.0000 mL | Freq: Two times a day (BID) | INTRAVENOUS | Status: DC
  Administered 2024-07-02 – 2024-07-08 (×12): 3 mL via INTRAVENOUS

## 2024-07-02 MED ORDER — PRIMIDONE 50 MG PO TABS
100.0000 mg | ORAL_TABLET | Freq: Every day | ORAL | Status: DC
  Administered 2024-07-03 – 2024-07-07 (×5): 100 mg via ORAL
  Filled 2024-07-02 (×5): qty 2

## 2024-07-02 MED ORDER — ACETAMINOPHEN 325 MG PO TABS: 650.0000 mg | ORAL_TABLET | Freq: Once | ORAL | Status: DC

## 2024-07-02 MED ORDER — PANTOPRAZOLE SODIUM 40 MG PO TBEC
40.0000 mg | DELAYED_RELEASE_TABLET | Freq: Every day | ORAL | Status: DC
Start: 2024-07-03 — End: 2024-07-08
  Administered 2024-07-03 – 2024-07-08 (×6): 40 mg via ORAL
  Filled 2024-07-02 (×6): qty 1

## 2024-07-02 MED ORDER — SODIUM CHLORIDE 0.9 % IV SOLN
200.0000 mg | Freq: Once | INTRAVENOUS | Status: AC
  Administered 2024-07-02: 200 mg via INTRAVENOUS
  Filled 2024-07-02: qty 40

## 2024-07-02 MED ORDER — ACETAMINOPHEN 10 MG/ML IV SOLN
1000.0000 mg | Freq: Once | INTRAVENOUS | Status: AC
  Administered 2024-07-02: 1000 mg via INTRAVENOUS
  Filled 2024-07-02: qty 100

## 2024-07-02 MED ORDER — POTASSIUM CHLORIDE 10 MEQ/100ML IV SOLN
10.0000 meq | Freq: Once | INTRAVENOUS | Status: AC
  Administered 2024-07-02: 10 meq via INTRAVENOUS
  Filled 2024-07-02: qty 100

## 2024-07-02 MED ORDER — ACETAMINOPHEN 325 MG PO TABS
650.0000 mg | ORAL_TABLET | Freq: Four times a day (QID) | ORAL | Status: DC | PRN
  Administered 2024-07-06: 650 mg via ORAL
  Filled 2024-07-02: qty 2

## 2024-07-02 MED ORDER — INSULIN ASPART 100 UNIT/ML IJ SOLN
0.0000 [IU] | INTRAMUSCULAR | Status: DC
  Administered 2024-07-02: 3 [IU] via SUBCUTANEOUS
  Administered 2024-07-03: 7 [IU] via SUBCUTANEOUS
  Administered 2024-07-04: 1 [IU] via SUBCUTANEOUS
  Administered 2024-07-04: 5 [IU] via SUBCUTANEOUS
  Administered 2024-07-04: 2 [IU] via SUBCUTANEOUS
  Administered 2024-07-04 (×2): 3 [IU] via SUBCUTANEOUS
  Administered 2024-07-05: 2 [IU] via SUBCUTANEOUS
  Administered 2024-07-05: 3 [IU] via SUBCUTANEOUS
  Administered 2024-07-05: 2 [IU] via SUBCUTANEOUS
  Administered 2024-07-05: 5 [IU] via SUBCUTANEOUS
  Administered 2024-07-05: 7 [IU] via SUBCUTANEOUS
  Filled 2024-07-02 (×10): qty 1
  Filled 2024-07-02: qty 3
  Filled 2024-07-02: qty 1

## 2024-07-02 MED ORDER — POTASSIUM CHLORIDE 20 MEQ PO PACK: 40.0000 meq | PACK | Freq: Once | ORAL | Status: DC

## 2024-07-02 MED ORDER — POTASSIUM CHLORIDE 10 MEQ/100ML IV SOLN
10.0000 meq | INTRAVENOUS | Status: AC
  Administered 2024-07-02 (×4): 10 meq via INTRAVENOUS
  Filled 2024-07-02 (×3): qty 100

## 2024-07-02 MED ORDER — MAGNESIUM SULFATE IN D5W 1-5 GM/100ML-% IV SOLN
1.0000 g | Freq: Once | INTRAVENOUS | Status: AC
  Administered 2024-07-02: 1 g via INTRAVENOUS
  Filled 2024-07-02: qty 100

## 2024-07-02 MED ORDER — ACETAMINOPHEN 650 MG RE SUPP: 650.0000 mg | Freq: Four times a day (QID) | RECTAL | Status: DC | PRN

## 2024-07-02 MED ORDER — LACTATED RINGERS IV SOLN: INTRAVENOUS | Status: AC

## 2024-07-02 NOTE — ED Provider Notes (Signed)
 Discussed case with Dr. Seena.  Patient admitted to hospital service   Dicky Anes, MD 07/02/24 1710

## 2024-07-02 NOTE — ED Notes (Signed)
 EDP at [facility]. Sepsis called.

## 2024-07-02 NOTE — ED Notes (Signed)
pCXR at [facility] 

## 2024-07-02 NOTE — ED Triage Notes (Signed)
 Pt to ED via [facility] from [facility] [place]. Pt was diagnosed with COVID 2 days ago. Pts fever has not gotten any better and pt is more altered per staff. Pt had BM on himself in route. Pt has hx/o dementia.

## 2024-07-02 NOTE — ED Provider Notes (Signed)
 [facility] Provider Note    Event Date/Time   First MD Initiated Contact with Patient 07/02/24 1337     (approximate)   History   Altered Mental Status   HPI  Anthony Craig is a 89 y.o. male past medical history significant for dementia, hypertension, hyperlipidemia, diabetes who presents to the emergency department with altered mental status.  History provided by EMS.  Patient presented from home place.  Recently tested positive for COVID and presents for worsening altered mental status.  Unable to provide a history of what his baseline is.  Endorse decreased p.o. intake.  Patient unable to provide any further history.     Physical Exam   Triage Vital Signs: ED Triage Vitals [07/02/24 1330]  Encounter Vitals Group     BP (!) 164/74     Girls Systolic BP Percentile      Girls Diastolic BP Percentile      Boys Systolic BP Percentile      Boys Diastolic BP Percentile      Pulse Rate 91     Resp      Temp (!) 101.1 F (38.4 C)     Temp Source Oral     SpO2 96 %     Weight      Height      Head Circumference      Peak Flow      Pain Score      Pain Loc      Pain Education      Exclude from Growth Chart     Most recent vital signs: Vitals:   07/02/24 1400 07/02/24 1405  BP: (!) 159/82   Pulse: 93 90  Resp:    Temp:    SpO2: 95% 98%    Physical Exam Constitutional:      Appearance: He is well-developed.  HENT:     Head: Atraumatic.     Mouth/Throat:     Mouth: Mucous membranes are dry.  Eyes:     Extraocular Movements: Extraocular movements intact.     Conjunctiva/sclera: Conjunctivae normal.     Pupils: Pupils are equal, round, and reactive to light.  Cardiovascular:     Rate and Rhythm: Regular rhythm.  Pulmonary:     Effort: No respiratory distress.  Abdominal:     Tenderness: There is no abdominal tenderness.  Musculoskeletal:        General: No swelling. Normal range of motion.     Cervical back: Normal range  of motion.  Skin:    General: Skin is warm.     Capillary Refill: Capillary refill takes 2 to 3 seconds.  Neurological:     Mental Status: He is alert. Mental status is at baseline.  Psychiatric:        Mood and Affect: Mood normal.     IMPRESSION / MDM / ASSESSMENT AND PLAN / ED COURSE  I reviewed the triage vital signs and the nursing notes.  On arrival febrile to 101.6, normotensive and 96% on room air  Differential diagnosis including COVID, dehydration, electrolyte abnormality, pneumonia   EKG  I, Clotilda Punter, the attending physician, personally viewed and interpreted this ECG.   Rate: Normal  Rhythm: Normal sinus  Axis: Normal  Intervals: Normal  ST&T Change: None  No tachycardic or bradycardic dysrhythmias while on cardiac telemetry.  RADIOLOGY I independently reviewed imaging, my interpretation of imaging: Chest x-ray with no signs of pneumonia  LABS (all labs ordered are listed, but  only abnormal results are displayed) Labs interpreted as -    Labs Reviewed  LACTIC ACID, PLASMA - Abnormal; Notable for the following components:      Result Value   Lactic Acid, Venous 2.1 (*)    All other components within normal limits  COMPREHENSIVE METABOLIC PANEL WITH GFR - Abnormal; Notable for the following components:   Sodium 133 (*)    Potassium 2.9 (*)    Chloride 92 (*)    Glucose, Bld 221 (*)    Calcium  8.4 (*)    All other components within normal limits  CBC WITH DIFFERENTIAL/PLATELET - Abnormal; Notable for the following components:   Lymphs Abs 0.4 (*)    All other components within normal limits  MAGNESIUM - Abnormal; Notable for the following components:   Magnesium 1.5 (*)    All other components within normal limits  CULTURE, BLOOD (ROUTINE X 2)  CULTURE, BLOOD (ROUTINE X 2)  PROTIME-INR  LACTIC ACID, PLASMA  URINALYSIS, W/ REFLEX TO CULTURE (INFECTION SUSPECTED)     MDM  No leukocytosis.  Multiple lab work abnormalities including  hypokalemia at 2.9 and hyper mag at 1.5.  Hyperglycemia but no concern for DKA.  Mild hyponatremia which is likely in the setting of volume depletion.  Initial lactic acid is 2.1.  Patient was given IV fluids and IV Tylenol .  Patient does have altered mental status but unclear of what his baseline is.  Concerned that he is unable to tolerate p.o. at this time.  Will give IV potassium and magnesium replacement.  Given IV fluid bolus.  Chest x-ray without obvious findings of pneumonia.  Consulted hospitalist for dehydration and multiple electrolyte abnormalities in the setting of COVID infection.     PROCEDURES:  Critical Care performed: No  Procedures  Patient's presentation is most consistent with acute presentation with potential threat to life or bodily function.   MEDICATIONS ORDERED IN ED: Medications  acetaminophen  (OFIRMEV ) IV 1,000 mg (1,000 mg Intravenous New Bag/Given 07/02/24 1512)  potassium chloride 10 mEq in 100 mL IVPB (has no administration in time range)  magnesium sulfate IVPB 1 g 100 mL (has no administration in time range)  sodium chloride  0.9 % bolus 1,000 mL (1,000 mLs Intravenous New Bag/Given 07/02/24 1410)    FINAL CLINICAL IMPRESSION(S) / ED DIAGNOSES   Final diagnoses:  Altered mental status, unspecified altered mental status type  Dehydration  Fever, unspecified fever cause  COVID  Hypomagnesemia  Hypokalemia     Rx / DC Orders   ED Discharge Orders     None        Note:  This document was prepared using Dragon voice recognition software and may include unintentional dictation errors.   Suzanne Kirsch, MD 07/02/24 [phone]

## 2024-07-02 NOTE — Progress Notes (Signed)
       CROSS COVER NOTE  NAME: Anthony Craig MRN: [id number] DOB : [birth year]    Concern as stated by nurse / staff   Elevated troponin     Pertinent findings on chart review: 89 year old male admitted earlier in the day with acute encephalopathy in the setting of new fevers and a COVID-19 diagnosis.  Started on remdesivir.  He is DNR/DNI and palliative care was consulted  Patient Assessment    07/02/2024    9:13 PM 07/02/2024    8:37 PM 07/02/2024    7:30 PM  Vitals with BMI  Height 5' 8    Weight 130 lbs 15 oz    BMI 19.92    Systolic  142 95  Diastolic  61 54  Pulse  68 65    Cardiac Panel (last 3 results) Recent Labs    07/02/24 2133  TROPONINIHS 428*      Assessment and  Interventions   Assessment:   Elevated troponin in the setting of COVID-19 infection and fever--suspect demand ischemia History of CAD  Plan: Continue current management of COVID Patient without chest pain We will get a follow-up EKG Would suggest conservative management but can consider cardiology consult in the a.m.

## 2024-07-02 NOTE — ED Notes (Signed)
 Difficulty with EKG, trouble shooting, pt tremulous and fidgety.

## 2024-07-02 NOTE — H&P (Addendum)
 History and Physical   Anthony Craig FMW:[id number] DOB: [birth year] DOA: 07/02/2024  PCP: [facility]   Patient coming from: [facility]  Chief Complaint: Altered mental status, COVID and fever  HPI: Anthony Craig is a 89 y.o. male with medical history significant of hypertension, hyperlipidemia, diabetes, GERD, essential tremor, chronic pancreatitis, chronic hepatitis, prostate cancer, spinal stenosis,  age-related cognitive decline presenting with altered mental status.  Patient presenting from facility where he was diagnosed with COVID 2 days ago.  Has had persistent fever and now developed worsening encephalopathy.  History of cognitive decline/dementia.  Patient unable to participate in review of systems due to encephalopathy  Was able to speak with patient's son(stepson), Anthony Craig.  Patient's son reports that for the past few weeks to months patient has had some generalized decline.  A few weeks ago patient was able to walk but having difficulty sitting or turning without assistance.  Has been largely bedbound and wheelchair-bound the last couple weeks including at his 89th birthday party at the memory care facility a week ago.  Anthony stated that he recognizes the decline in his stepfather and realizes that at 53 years old he may be progressing towards the end of his life.  Agrees that DNR and DNI is appropriate for patient and consistent with the patient's wishes.  States patient has had a living will in the past.  Patient's wife also has significant dementia and is in memory care.  Anthony is in agreement with consulting palliative care to establish goals of care, would like to continue with treating what is treatable for now.  Sounds though they will opt for avoiding any invasive interventions however.  ED Course: All signs in the ED notable for fever to 101.6, blood pressure in the 150s-170 systolic.  Lab workup included CMP with sodium  133, chloride 92, potassium 2.9, glucose 221, calcium  8.4.  CBC within normal limits.  PT and INR normal.  Lactic acid mildly elevated 2.1, repeat pending.  Urinalysis pending.  Blood cultures pending.  Chest x-ray showed no acute abnormality.  Patient received Tylenol , 1 L IV fluids, 10 mEq IV potassium, 1 g IV magnesium in the ED.  Review of Systems: Patient unable to participate in review of systems due to encephalopathy  Past Medical History:  Diagnosis Date   Cancer [facility])    pancreatic   Coronary artery disease    Diabetes mellitus without complication (HCC)    type 2   Hyperlipidemia    Hypertension     Past Surgical History:  Procedure Laterality Date   APPENDECTOMY     1977   BILIARY DRAINAGE     CORONARY ARTERY BYPASS GRAFT     2001-3 vessel   HERNIA REPAIR Bilateral    2016-left, 2009-right   WHIPPLE PROCEDURE  2007    Social History  reports that he quit smoking about 44 years ago. His smoking use included cigarettes. He started smoking about 74 years ago. He has a 15 pack-year smoking history. He has never used smokeless tobacco. He reports current alcohol use. He reports that he does not use drugs.  Allergies  Allergen Reactions   Doxycycline Other (See Comments)    Reaction: per [facility] patient called on 11/30/14 stating he was having a reaction to a medication possibly including redness, swelling and itching, he was not sure at that time if it was this medication or Hydrocodone -homatropin.    Hydrocodone  Bit-Homatrop Mbr Other (See Comments)  Reaction:  Reaction: per [facility] patient called on 11/30/14 stating he was having a reaction to a medication possibly including redness, swelling and itching, he was not sure at that time if it was this medication or Doxy that he was taking at the same time. Patient has been taking Hydrocodone  with no problems.    Family History  Problem Relation Age of Onset   Heart disease Mother    Cancer Mother 84       breast    Heart disease Father    COPD Brother    Stroke Brother    Diabetes Other        1 and 2  Reviewed on admission  Prior to Admission medications   Medication Sig Start Date End Date Taking? Authorizing Provider  Alcohol Swabs (B-D SINGLE USE SWABS REGULAR) PADS USE AS DIRECTED  WITH  EACH  FINGERSTICK 12/24/15   Bertrum Charlie CROME, MD  aspirin  EC 81 MG tablet Take 81 mg by mouth daily.    [provider]  atorvastatin  (LIPITOR) 40 MG tablet TAKE 1 TABLET BY MOUTH EVERYDAY AT BEDTIME Patient taking differently: 20 mg. 12/26/18   Bertrum Charlie CROME, MD  b complex vitamins tablet Take 1 tablet by mouth daily.    [provider]  calcium -vitamin D 250-100 MG-UNIT per tablet Take 1 tablet by mouth daily.    [provider]  cholecalciferol (VITAMIN D) 1000 UNITS tablet Take 1,000 Units by mouth daily.    [provider]  Coenzyme Q10 (COQ10) 200 MG CAPS Take 100 mg by mouth daily.     [provider]  diphenoxylate-atropine (LOMOTIL) 2.5-0.025 MG per tablet Take 1 tablet by mouth 4 (four) times daily as needed for diarrhea or loose stools.    [provider]  fluticasone  (FLONASE ) 50 MCG/ACT nasal spray Place 2 sprays into both nostrils daily. 12/16/21   Bertrum Charlie CROME, MD  Ginkgo Biloba 40 MG TABS Take 1 tablet by mouth daily with supper.    [provider]  Glucosamine-Chondroitin (OSTEO BI-FLEX REGULAR STRENGTH PO) Take 1 tablet by mouth in the morning and at bedtime.    [provider]  hydrocortisone 2.5 % lotion Apply 1 application topically 2 (two) times daily. To affected areas of face only as needed for flares of itching/scaling     [provider]  hydroxypropyl methylcellulose / hypromellose (ISOPTO TEARS / GONIOVISC) 2.5 % ophthalmic solution Place 1 drop into both eyes 4 (four) times daily as needed for dry eyes.    [provider]  insulin  aspart protamine - aspart (NOVOLOG  MIX 70/30 FLEXPEN)  (70-30) 100 UNIT/ML FlexPen 16 units in the am and 7 units after dinner. 11/12/20   [provider]  lipase/protease/amylase (CREON ) 36000 UNITS CPEP capsule Take 36,000 Units by mouth 3 (three) times daily with meals.    [provider]  Misc Natural Products (GINSENG COMPLEX PO) Take 1 tablet by mouth daily with lunch.    [provider]  Multiple Vitamins-Minerals (MULTIVITAMIN PO) Take 1 tablet by mouth daily.    [provider]  Multiple Vitamins-Minerals (PRESERVISION AREDS PO) Take by mouth.     [provider]  Omega-3 Fatty Acids (FISH OIL) 1000 MG CAPS Take 1 capsule by mouth daily with supper.    [provider]  omeprazole  (PRILOSEC) 20 MG capsule Take 20 mg by mouth daily. 11/05/21   [provider]  OVER THE COUNTER MEDICATION Focus Factor multivitamin 1 tablet by mouth  daily at noon    [provider]  primidone  (MYSOLINE ) 50 MG tablet Take 1 tablet (50 mg total) by mouth 4 (four) times daily. Patient not taking: Reported on 02/13/2022 12/02/16   Bertrum Charlie CROME, MD  ursodiol (ACTIGALL) 300 MG capsule Take 300 mg by mouth 2 (two) times daily.    [provider]  vitamin B-12 (CYANOCOBALAMIN) 1000 MCG tablet Take 5,000 mcg by mouth daily.     [provider]  vitamin E 180 MG (400 UNITS) capsule Take 400 Units by mouth daily.    [provider]    Physical Exam: Vitals:   07/02/24 1445 07/02/24 1500 07/02/24 1515 07/02/24 1530  BP: (!) 144/78 (!) 169/60 121/86 (!) 139/38  Pulse: 94 97 (!) 104 87  Resp: 15 20 20 19   Temp:      TempSrc:      SpO2: 94% 97% 97% 98%  Weight:      Height:        Physical Exam Constitutional:      General: He is not in acute distress.    Appearance: He is ill-appearing.  HENT:     Head: Normocephalic and atraumatic.     Mouth/Throat:     Mouth: Mucous membranes are moist.     Pharynx: Oropharynx is clear.  Eyes:     Extraocular Movements:  Extraocular movements intact.     Pupils: Pupils are equal, round, and reactive to light.  Cardiovascular:     Rate and Rhythm: Normal rate and regular rhythm.     Pulses: Normal pulses.     Heart sounds: Normal heart sounds.  Pulmonary:     Effort: Pulmonary effort is normal. No respiratory distress.     Breath sounds: Normal breath sounds.  Abdominal:     General: Bowel sounds are normal. There is no distension.     Palpations: Abdomen is soft.     Tenderness: There is no abdominal tenderness.  Musculoskeletal:        General: No swelling or deformity.  Skin:    General: Skin is warm and dry.  Neurological:     General: No focal deficit present.     Comments: Drowsy, arousable to painful and/or loud stimuli.  Opens eyes but not answering questions.    Labs on Admission: I have personally reviewed following labs and imaging studies  CBC: Recent Labs  Lab 07/02/24 1400  WBC 7.7  NEUTROABS 6.9  HGB 13.0  HCT 39.2  MCV 80.7  PLT 176    Basic Metabolic Panel: Recent Labs  Lab 07/02/24 1400  NA 133*  K 2.9*  CL 92*  CO2 31  GLUCOSE 221*  BUN 13  CREATININE 1.05  CALCIUM  8.4*  MG 1.5*    GFR: Estimated Creatinine Clearance: 37.5 mL/min (by C-G formula based on SCr of 1.05 mg/dL).  Liver Function Tests: Recent Labs  Lab 07/02/24 1400  AST 25  ALT 12  ALKPHOS 99  BILITOT 0.7  PROT 7.0  ALBUMIN 3.6    Urine analysis:    Component Value Date/Time   COLORURINE Yellow 01/16/2014 0231   APPEARANCEUR Clear 01/16/2014 0231   LABSPEC 1.009 01/16/2014 0231   PHURINE 5.0 01/16/2014 0231   GLUCOSEU Negative 01/16/2014 0231   HGBUR Negative 01/16/2014 0231   BILIRUBINUR Negative 06/08/2019 1557   BILIRUBINUR Negative 01/16/2014 0231   KETONESUR Negative 01/16/2014 0231   PROTEINUR Negative 06/08/2019 1557   PROTEINUR Negative 01/16/2014 0231   UROBILINOGEN 0.2 06/08/2019  1557   NITRITE Negative 06/08/2019 1557   NITRITE Negative 01/16/2014 0231    LEUKOCYTESUR Negative 06/08/2019 1557   LEUKOCYTESUR Negative 01/16/2014 0231    Radiological Exams on Admission: DG Chest Port 1 View Result Date: 07/02/2024 EXAM: 1 VIEW(S) XRAY OF THE CHEST 07/02/2024 01:58:00 PM COMPARISON: 08/30/2023 CLINICAL HISTORY: Questionable sepsis - evaluate for abnormality. FINDINGS: LUNGS AND PLEURA: Azygous lobe. Lungs are hypoinflated. No focal pulmonary opacity. No pulmonary edema. No pleural effusion. No pneumothorax. HEART AND MEDIASTINUM: Sternotomy wires noted. Aortic atherosclerosis. CABG markers noted. No acute abnormality of the cardiac and mediastinal silhouettes. BONES AND SOFT TISSUES: No acute osseous abnormality. IMPRESSION: 1. No acute cardiopulmonary process. Electronically signed by: Waddell Calk MD 07/02/2024 02:09 PM EDT RP Workstation: [id number]   EKG: Independently reviewed.  Atrial fibrillation versus sinus rhythm with tremor artifact and sinus arrhythmia at 96 bpm.  Right bundle branch block.  Nonspecific T wave changes.  Interpretation limited by artifact and baseline wander.  Assessment/Plan Principal Problem:   Acute encephalopathy Active Problems:   Type 2 diabetes mellitus with other circulatory complication, with long-term current use of insulin  (HCC)   Benign essential tremor   Acid reflux   HLD (hyperlipidemia)   BP (high blood pressure)   Chronic pancreatitis, unspecified pancreatitis type (HCC)   CA of prostate (HCC)   Chronic hepatitis (HCC)   Spinal stenosis, lumbar region, with neurogenic claudication   Encephalopathy on baseline dementia Fever COVID-19 > Patient recently diagnosed with COVID-19 at facility 2 days ago.  Has had persistent fevers and worsening altered mental status.  Presented for this altered mentation. > No leukocytosis, chest x-ray without pneumonia, urinalysis and blood cultures pending.  Suspect COVID-19 is indeed primary driver. > Received Tylenol  and 1 L IV fluids in the ED. - Will monitor on  telemetry overnight - Remdesivir - Tylenol  for fevers - Delirium precautions - Supportive care - With chronic dementia and decline over recent weeks, will reach out to palliative care.  Spoke with Anthony, patient's stepson, who agrees with DNR/DNI being appropriate and consistent with patient wishes.  Hypokalemia Hypomagnesemia > In setting of COVID-19 and decreased p.o. intake/AMS as above. > Received 10 mEq IV potassium in the ED.  And 1 g IV magnesium. - Additional 40 mEq IV potassium - Trend renal function and electrolytes  Hypertension - Med reconciliation ongoing, [facility]  Hyperlipidemia CAD > Status post CABG 2001 - Continue atorvastatin  based on [place] records  Diabetes > 70/30 insulin  5 units twice daily at baseline - SSI  GERD - Continue PPI  Essential tremor - Continue primidone  when tolerating PO  Chronic pancreatitis - Continue home Creon   History of pancreatic cancer > Approximately 15 years ago per son.  Was initially given 5-year prognosis at that time. - Noted  History of prostate cancer History of cataract History of chronic hepatitis Spinal stenosis - Noted  DVT prophylaxis: Lovenox Code Status:   DNR/DNI.  Palliative consult. Family Communication:  Step Son updated by phone, please call if any significant updates.  Bed. Wife has dementia as well.    Disposition Plan:   Patient is from:  [facility] [place] [facility]  Anticipated DC to:  Same as above  Anticipated DC date:  1 to 3 days  Anticipated DC barriers: None  Consults called:  None Admission status:  Observation, telemetry  Severity of Illness: The appropriate patient status for this patient is OBSERVATION. Observation status is judged to be reasonable and  necessary in order to provide the required intensity of service to ensure the patient's safety. The patient's presenting symptoms, physical exam findings, and initial radiographic and laboratory data in  the context of their medical condition is felt to place them at decreased risk for further clinical deterioration. Furthermore, it is anticipated that the patient will be medically stable for discharge from the hospital within 2 midnights of admission.    Marsa KATHEE Scurry MD [facility]  How to contact the [facility] Attending or Consulting provider 7A - 7P or covering provider during after hours 7P -7A, for this patient?   Check the care team in [facility] and look for a) attending/consulting [facility] provider listed and b) the [facility] team listed Log into [email or web address] and use [facility]'s universal password to access. If you do not have the password, please contact the hospital operator. Locate the [facility] provider you are looking for under [facility] and page to a number that you can be directly reached. If you still have difficulty reaching the provider, please page the [facility] (Director on Call) for the Hospitalists listed on amion for assistance.  07/02/2024, 4:20 PM

## 2024-07-03 DIAGNOSIS — Z515 Encounter for palliative care: Secondary | ICD-10-CM

## 2024-07-03 DIAGNOSIS — E876 Hypokalemia: Secondary | ICD-10-CM

## 2024-07-03 DIAGNOSIS — U071 COVID-19: Secondary | ICD-10-CM | POA: Diagnosis present

## 2024-07-03 DIAGNOSIS — G9340 Encephalopathy, unspecified: Secondary | ICD-10-CM | POA: Diagnosis not present

## 2024-07-03 DIAGNOSIS — Z789 Other specified health status: Secondary | ICD-10-CM

## 2024-07-03 DIAGNOSIS — K861 Other chronic pancreatitis: Secondary | ICD-10-CM | POA: Diagnosis present

## 2024-07-03 DIAGNOSIS — E1165 Type 2 diabetes mellitus with hyperglycemia: Secondary | ICD-10-CM | POA: Diagnosis present

## 2024-07-03 DIAGNOSIS — Z66 Do not resuscitate: Secondary | ICD-10-CM | POA: Diagnosis present

## 2024-07-03 DIAGNOSIS — E860 Dehydration: Secondary | ICD-10-CM

## 2024-07-03 DIAGNOSIS — E1159 Type 2 diabetes mellitus with other circulatory complications: Secondary | ICD-10-CM | POA: Diagnosis present

## 2024-07-03 DIAGNOSIS — I2489 Other forms of acute ischemic heart disease: Secondary | ICD-10-CM | POA: Diagnosis present

## 2024-07-03 DIAGNOSIS — Z7189 Other specified counseling: Secondary | ICD-10-CM

## 2024-07-03 DIAGNOSIS — F0390 Unspecified dementia without behavioral disturbance: Secondary | ICD-10-CM | POA: Diagnosis present

## 2024-07-03 DIAGNOSIS — E785 Hyperlipidemia, unspecified: Secondary | ICD-10-CM | POA: Diagnosis present

## 2024-07-03 DIAGNOSIS — E872 Acidosis, unspecified: Secondary | ICD-10-CM | POA: Diagnosis present

## 2024-07-03 DIAGNOSIS — A4189 Other specified sepsis: Secondary | ICD-10-CM | POA: Diagnosis present

## 2024-07-03 DIAGNOSIS — R4182 Altered mental status, unspecified: Secondary | ICD-10-CM

## 2024-07-03 DIAGNOSIS — R509 Fever, unspecified: Secondary | ICD-10-CM

## 2024-07-03 DIAGNOSIS — Z794 Long term (current) use of insulin: Secondary | ICD-10-CM | POA: Diagnosis not present

## 2024-07-03 DIAGNOSIS — G9341 Metabolic encephalopathy: Secondary | ICD-10-CM | POA: Diagnosis present

## 2024-07-03 DIAGNOSIS — Z7982 Long term (current) use of aspirin: Secondary | ICD-10-CM | POA: Diagnosis not present

## 2024-07-03 DIAGNOSIS — G250 Essential tremor: Secondary | ICD-10-CM | POA: Diagnosis present

## 2024-07-03 DIAGNOSIS — E871 Hypo-osmolality and hyponatremia: Secondary | ICD-10-CM | POA: Diagnosis present

## 2024-07-03 DIAGNOSIS — R339 Retention of urine, unspecified: Secondary | ICD-10-CM | POA: Diagnosis present

## 2024-07-03 DIAGNOSIS — I2510 Atherosclerotic heart disease of native coronary artery without angina pectoris: Secondary | ICD-10-CM | POA: Diagnosis present

## 2024-07-03 DIAGNOSIS — K219 Gastro-esophageal reflux disease without esophagitis: Secondary | ICD-10-CM | POA: Diagnosis present

## 2024-07-03 DIAGNOSIS — R6520 Severe sepsis without septic shock: Secondary | ICD-10-CM | POA: Diagnosis present

## 2024-07-03 DIAGNOSIS — M48062 Spinal stenosis, lumbar region with neurogenic claudication: Secondary | ICD-10-CM | POA: Diagnosis present

## 2024-07-03 DIAGNOSIS — I10 Essential (primary) hypertension: Secondary | ICD-10-CM | POA: Diagnosis present

## 2024-07-03 LAB — URINALYSIS, W/ REFLEX TO CULTURE (INFECTION SUSPECTED)
Bilirubin Urine: NEGATIVE
Glucose, UA: 150 mg/dL — AB
Hgb urine dipstick: NEGATIVE
Ketones, ur: NEGATIVE mg/dL
Leukocytes,Ua: NEGATIVE
Nitrite: NEGATIVE
Protein, ur: 30 mg/dL — AB
Specific Gravity, Urine: 1.027 (ref 1.005–1.030)
pH: 5 (ref 5.0–8.0)

## 2024-07-03 LAB — COMPREHENSIVE METABOLIC PANEL WITH GFR
ALT: 12 U/L (ref 0–44)
AST: 17 U/L (ref 15–41)
Albumin: 2.8 g/dL — ABNORMAL LOW (ref 3.5–5.0)
Alkaline Phosphatase: 72 U/L (ref 38–126)
Anion gap: 12 (ref 5–15)
BUN: 14 mg/dL (ref 8–23)
CO2: 29 mmol/L (ref 22–32)
Calcium: 8.2 mg/dL — ABNORMAL LOW (ref 8.9–10.3)
Chloride: 96 mmol/L — ABNORMAL LOW (ref 98–111)
Creatinine, Ser: 0.54 mg/dL — ABNORMAL LOW (ref 0.61–1.24)
GFR, Estimated: >60 mL/min (ref >60)
Glucose, Bld: 91 mg/dL (ref 70–99)
Potassium: 3.1 mmol/L — ABNORMAL LOW (ref 3.5–5.1)
Sodium: 137 mmol/L (ref 135–145)
Total Bilirubin: 0.4 mg/dL (ref 0.0–1.2)
Total Protein: 5.6 g/dL — ABNORMAL LOW (ref 6.5–8.1)

## 2024-07-03 LAB — CBC
HCT: 33.4 % — ABNORMAL LOW (ref 39.0–52.0)
Hemoglobin: 11 g/dL — ABNORMAL LOW (ref 13.0–17.0)
MCH: 26.7 pg (ref 26.0–34.0)
MCHC: 32.9 g/dL (ref 30.0–36.0)
MCV: 81.1 fL (ref 80.0–100.0)
Platelets: 146 K/uL — ABNORMAL LOW (ref 150–400)
RBC: 4.12 MIL/uL — ABNORMAL LOW (ref 4.22–5.81)
RDW: 13.4 % (ref 11.5–15.5)
WBC: 3.9 K/uL — ABNORMAL LOW (ref 4.0–10.5)
nRBC: 0 % (ref 0.0–0.2)

## 2024-07-03 LAB — GLUCOSE, CAPILLARY
Glucose-Capillary: 101 mg/dL — ABNORMAL HIGH (ref 70–99)
Glucose-Capillary: 323 mg/dL — ABNORMAL HIGH (ref 70–99)
Glucose-Capillary: 79 mg/dL (ref 70–99)
Glucose-Capillary: 91 mg/dL (ref 70–99)
Glucose-Capillary: 92 mg/dL (ref 70–99)

## 2024-07-03 LAB — TROPONIN I (HIGH SENSITIVITY): Troponin I (High Sensitivity): 345 ng/L (ref <18)

## 2024-07-03 LAB — MAGNESIUM: Magnesium: 1.8 mg/dL (ref 1.7–2.4)

## 2024-07-03 MED ORDER — SERTRALINE HCL 50 MG PO TABS
100.0000 mg | ORAL_TABLET | Freq: Every morning | ORAL | Status: DC
  Administered 2024-07-04 – 2024-07-08 (×5): 100 mg via ORAL
  Filled 2024-07-03 (×5): qty 2

## 2024-07-03 MED ORDER — HYDRALAZINE HCL 20 MG/ML IJ SOLN: 10.0000 mg | Freq: Four times a day (QID) | INTRAMUSCULAR | Status: DC | PRN

## 2024-07-03 MED ORDER — POTASSIUM CHLORIDE 10 MEQ/100ML IV SOLN
10.0000 meq | INTRAVENOUS | Status: AC
  Administered 2024-07-03 (×4): 10 meq via INTRAVENOUS
  Filled 2024-07-03 (×4): qty 100

## 2024-07-03 MED ORDER — CHLORHEXIDINE GLUCONATE CLOTH 2 % EX PADS
6.0000 | MEDICATED_PAD | Freq: Every day | CUTANEOUS | Status: DC
  Administered 2024-07-03 – 2024-07-08 (×6): 6 via TOPICAL

## 2024-07-03 NOTE — Consult Note (Signed)
 Consultation Note Date: 07/03/2024 at 1100  Patient Name: Anthony Craig  DOB: 03/18/1928  MRN: [id number]  Age / Sex: 89 y.o., male  PCP: [facility] Referring Physician: Awanda City, MD  HPI/Patient Profile: 89 y.o. male  with past medical history significant for HTN, HLD, DM II, GERD, essential tremor, chronic pancreatitis, chronic hepatitis, prostate cancer, spinal stenosis and age-related cognitive decline. Patient presented to ED 07/02/2024 from [facility] memory care unit c/o AMS, fever and positive Covid test 2 days PTA.  ED labs significant for lactic 2.1, Na+ 133, K+ 2.9, Calcium  8.4, mag 1.5. Covid test +. CXR negative. UA positive for proteinuria and rare bacteria.   ED vitals 164/74, HR 91, SpO2 96% RA, 101.1 F  TRH was consulted for admission and management of encephalopathy on baseline dementia, feverr, Covid 19 infection, hypokalemia and hypomagnesemia.  Palliative care was consulted for assistance with goals of care conversations.   Clinical Assessment and Goals of Care: Extensive chart review completed prior to meeting patient including labs, vital signs, imaging, progress notes, orders, and available advanced directive documents from current and previous encounters. I then met with patient at bedside and spoke with Zachary, step-son, to discuss diagnosis prognosis, GOC, EOL wishes, disposition and options.     Latest Ref Rng & Units 07/03/2024    5:18 AM 07/02/2024    2:00 PM 08/30/2023    4:30 PM  CBC  WBC 4.0 - 10.5 K/uL 3.9  7.7  5.8   Hemoglobin 13.0 - 17.0 g/dL 88.9  86.9  85.2   Hematocrit 39.0 - 52.0 % 33.4  39.2  43.6   Platelets 150 - 400 K/uL 146  176  167       Latest Ref Rng & Units 07/03/2024    5:18 AM 07/02/2024    2:00 PM 08/30/2023    4:30 PM  CMP  Glucose 70 - 99 mg/dL 91  778  557   BUN 8 - 23 mg/dL 14  13  18    Creatinine 0.61 - 1.24 mg/dL 9.45   8.94  9.12   Sodium 135 - 145 mmol/L 137  133  129   Potassium 3.5 - 5.1 mmol/L 3.1  2.9  4.6   Chloride 98 - 111 mmol/L 96  92  93   CO2 22 - 32 mmol/L 29  31  26    Calcium  8.9 - 10.3 mg/dL 8.2  8.4  9.3   Total Protein 6.5 - 8.1 g/dL 5.6  7.0    Total Bilirubin 0.0 - 1.2 mg/dL 0.4  0.7    Alkaline Phos 38 - 126 U/L 72  99    AST 15 - 41 U/L 17  25    ALT 0 - 44 U/L 12  12       Ill-appearing, elderly male resting in bed. He is alert, but only oriented to self. He is able to answer questions but has difficulty carrying conversation. Respirations are even and unlabored. He is in no distress.   Pt  denies CO, SOB or other pain. He is able to confirm that Ronal is his wife but unable to tell me who Zachary is. Respirations are even and unlabored. He is in no distress.   I introduced Palliative Medicine as specialized medical care for people living with serious illness. It focuses on providing relief from the symptoms and stress of a serious illness. The goal is to improve quality of life for both the patient and the family.  We discussed a brief life review of the patient. Zachary shares that Ron has been married to his mother for approximately 27 years. They reside together at [facility] memory care. Zachary is the only living family member left, after his sister Corporate investment banker) died last year. 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   As far as functional and nutritional status, Zachary states he has witnessed a significant physical decline requiring wheelchair or being bed bound over the past 2 weeks. Ron has required assistance with his ADLs for a long time due to progressing dementia. Zachary shares that Ron has a big appetite and will eat several plates of food at one time.   We discussed patient's current illness and what it means in the larger context of patient's on-going co-morbidities.  Natural disease trajectory and expectations at EOL were  discussed.  I attempted to elicit values and goals of care important to the patient. Zachary wants to ensure that his stepfather has the best care and to include palliative and hospice care when he gets to that stage.   The difference between aggressive medical intervention and comfort care was considered in light of the patient's goals of care.   Advance directives, concepts specific to code status, artificial feeding and hydration, and rehospitalization were considered and discussed. Zachary shares his stepfather's living will states he never wants artificial support or a feeding tube. He confirms no CPR or ventilator. Pt is a DNR.   Education offered regarding concept specific to human mortality and the limitations of medical interventions to prolong life when the body begins to fail to thrive. Zachary understands that dementia is a progressive disease. He shares with the recent sudden decline, his feels his stepfather may be nearing end of life.   Family is facing treatment option decisions, advanced directive, and anticipatory care needs.    Discussed with patient/family the importance of continued conversation with family and the medical providers regarding overall plan of care and treatment options, ensuring decisions are within the context of the patient's values and GOCs.    Hospice and Palliative Care services outpatient were explained and offered. Zachary wants patient referred for OP palliative services at d/c.   Questions and concerns were addressed. The family was encouraged to call with questions or concerns.   Primary Decision Maker NEXT OF KIN Zachary Donna, stepson   Physical Exam Vitals reviewed.  Constitutional:      General: He is not in acute distress.    Appearance: He is ill-appearing.  HENT:     Head: Normocephalic and atraumatic.     Mouth/Throat:     Mouth: Mucous membranes are dry.  Pulmonary:     Effort: Pulmonary effort is normal. No respiratory distress.   Abdominal:     General: There is no distension.     Tenderness: There is no abdominal tenderness. There is no guarding.  Skin:    General: Skin is warm and dry.  Neurological:     Mental Status: He  is alert. He is disoriented.     Motor: Weakness present.  Psychiatric:        Mood and Affect: Mood normal.        Behavior: Behavior normal.     Recommendations/Plan: DNR/DNI Continue current supportive interventions Plan to return to facility when medically stable Will refer for OP palliative to follow  Palliative to follow for needs   Palliative Assessment/Data:40-50%   Discussed plan of care with primary RN.  Thank you for this consult. Palliative medicine will continue to follow and assist holistically.   Time Total: 90 minutes  Time spent includes: Detailed review of medical records (labs, imaging, vital signs), medically appropriate exam (mental status, respiratory, cardiac, skin), discussed with treatment team, counseling and educating patient, family and staff, documenting clinical information, medication management and coordination of care.     Devere Sacks, AMANDA [facility] Palliative Medicine Team  07/03/2024 8:50 AM  Office [phone]  Pager [phone]     Please contact Palliative Medicine Team providers via AMION for questions and concerns.

## 2024-07-03 NOTE — Progress Notes (Signed)
  PROGRESS NOTE    Christohper Dube Craig  FMW:[id number] DOB: 1927-11-15 DOA: 07/02/2024 [facility]  [facility]A-AA  LOS: 0 days   Brief hospital course:   Assessment & Plan: Anthony Craig is a 89 y.o. male with medical history significant of hypertension, hyperlipidemia, diabetes, GERD, essential tremor, chronic pancreatitis, chronic hepatitis, prostate cancer, spinal stenosis,  age-related cognitive decline presenting with altered mental status.   Patient presenting from facility where he was diagnosed with COVID 2 days ago.  Has had persistent fever and now developed worsening encephalopathy.  son reports that for the past few weeks to months patient has had some generalized decline. A few weeks ago patient was able to walk but having difficulty sitting or turning without assistance. Has been largely bedbound and wheelchair-bound the last couple weeks including at his 89th birthday party at the memory care facility a week ago.    Encephalopathy on baseline dementia Fever COVID-19 > Patient recently diagnosed with COVID-19 at facility 2 days ago.  Has had persistent fevers and worsening altered mental status.  Presented for this altered mentation. > No leukocytosis, chest x-ray without pneumonia, urinalysis and blood cultures pending.  Suspect COVID-19 is indeed primary driver. > Received Tylenol  and 1 L IV fluids in the ED.  Started on Remdesivir. --no hypoxia, respiratory distress.  CXR clear --d/c Remdesivir today  General decline and weakness --PT/OT --palliative care consult  Hypokalemia Hypomagnesemia --monitor and supplement PRN   Hypertension --IV hydralazine PRN for now   Hyperlipidemia CAD > Status post CABG 2001 - [facility]   Diabetes --ACHS and SSI   GERD - Continue PPI   Essential tremor - Continue primidone  when tolerating PO   Chronic pancreatitis - Continue home Creon    History of pancreatic  cancer > Approximately 15 years ago per son.  Was initially given 5-year prognosis at that time. - Noted   History of prostate cancer History of cataract History of chronic hepatitis Spinal stenosis - Noted   DVT prophylaxis: Lovenox SQ Code Status: DNR  Family Communication:  Level of care: Telemetry Medical Dispo:   The patient is from: ALF Anticipated d/c is to: to be determined Anticipated d/c date is: whenever disposition determined   Subjective and Interval History:  Pt was alert and eating well on his own today.   Objective: Vitals:   07/02/24 2113 07/03/24 0420 07/03/24 0945 07/03/24 1134  BP:  132/68 (!) 165/73 (!) 168/70  Pulse:  61 63 68  Resp:   19 18  Temp:  98.6 F (37 C) 99 F (37.2 C) 98.9 F (37.2 C)  TempSrc:  Oral    SpO2:  97% 98% 99%  Weight: 59.4 kg     Height: 5' 8 (1.727 m)       Intake/Output Summary (Last 24 hours) at 07/03/2024 1853 Last data filed at 07/03/2024 1527 Gross per 24 hour  Intake 1433.33 ml  Output 350 ml  Net 1083.33 ml   Filed Weights   07/02/24 1346 07/02/24 2113  Weight: 61.6 kg 59.4 kg    Examination:   Constitutional: NAD, alert HEENT: conjunctivae and lids normal, EOMI CV: No cyanosis.   RESP: normal respiratory effort, on RA Neuro: II - XII grossly intact.     Data Reviewed: I have personally reviewed labs and imaging studies  Time spent: 50 minutes  Ellouise Haber, MD [facility] If 7PM-7AM, please contact night-coverage 07/03/2024, 6:53 PM

## 2024-07-03 NOTE — Progress Notes (Signed)
 Patient was bladder scanned by this RN which showed 766 ML's. Attempted a straight Cath but could not advanced due to resistance. Will ask for ICU nurse to attempt a  Coude cath.

## 2024-07-03 NOTE — Plan of Care (Signed)

## 2024-07-04 DIAGNOSIS — U071 COVID-19: Secondary | ICD-10-CM | POA: Diagnosis not present

## 2024-07-04 DIAGNOSIS — Z789 Other specified health status: Secondary | ICD-10-CM | POA: Diagnosis not present

## 2024-07-04 DIAGNOSIS — Z515 Encounter for palliative care: Secondary | ICD-10-CM | POA: Diagnosis not present

## 2024-07-04 DIAGNOSIS — G9340 Encephalopathy, unspecified: Secondary | ICD-10-CM | POA: Diagnosis not present

## 2024-07-04 LAB — GLUCOSE, CAPILLARY
Glucose-Capillary: 115 mg/dL — ABNORMAL HIGH (ref 70–99)
Glucose-Capillary: 125 mg/dL — ABNORMAL HIGH (ref 70–99)
Glucose-Capillary: 191 mg/dL — ABNORMAL HIGH (ref 70–99)
Glucose-Capillary: 207 mg/dL — ABNORMAL HIGH (ref 70–99)
Glucose-Capillary: 236 mg/dL — ABNORMAL HIGH (ref 70–99)
Glucose-Capillary: 267 mg/dL — ABNORMAL HIGH (ref 70–99)
Glucose-Capillary: 281 mg/dL — ABNORMAL HIGH (ref 70–99)
Glucose-Capillary: 294 mg/dL — ABNORMAL HIGH (ref 70–99)
Glucose-Capillary: 83 mg/dL (ref 70–99)

## 2024-07-04 LAB — POTASSIUM: Potassium: 3.3 mmol/L — ABNORMAL LOW (ref 3.5–5.1)

## 2024-07-04 MED ORDER — POTASSIUM CHLORIDE 20 MEQ PO PACK
40.0000 meq | PACK | Freq: Once | ORAL | Status: AC
  Administered 2024-07-04: 40 meq via ORAL
  Filled 2024-07-04: qty 2

## 2024-07-04 NOTE — TOC Initial Note (Addendum)
 Transition of Care [facility]) - Initial/Assessment Note    Patient Details  Name: Anthony Craig MRN: [id number] Date of Birth: 20-Aug-1928  [facility] of [facility] CM/SW Contact:    Lauraine JAYSON Carpen, LCSW Phone Number: 07/04/2024, 2:57 PM  Clinical Narrative:  Patient not fully oriented. CSW called [facility] and spoke to [facility]. Patient is from their memory care side. They do have an in-house therapy agency if needed. Per PT, okay to return to memory care with PT. [facility]'s preferred outpatient palliative agency is [facility]. Per MD, may need to discharge with a foley due to retention yesterday. Kreg stated they cannot accept patient with a foley. MD is aware. Will try voiding trial tomorrow. Left voicemail for stepson.        3:07 pm: Received call back from stepson. Provided update. He is agreeable to [facility] for outpatient palliative follow up. Liaison is aware.        Expected Discharge Plan: Memory Care Barriers to Discharge: Continued Medical Work up   Patient Goals and CMS Choice            Expected Discharge Plan and Services     Post Acute Care Choice: Resumption of Svcs/PTA Provider Living arrangements for the past 2 months: Assisted Living Facility (Memory Care)                                      Prior Living Arrangements/Services Living arrangements for the past 2 months: Assisted Living Facility (Memory Care) Lives with:: Facility Resident Patient language and need for interpreter reviewed:: Yes Do you feel safe going back to the place where you live?: Yes      Need for Family Participation in Patient Care: Yes (Comment) Care giver support system in place?: Yes (comment)   Criminal Activity/Legal Involvement Pertinent to Current Situation/Hospitalization: No - Comment as needed  Activities of Daily Living      Permission Sought/Granted Permission sought to share information with : Aeronautical engineer granted to share info w AGENCY: [facility]        Emotional Assessment       Orientation: : Oriented to Self, Oriented to Place Alcohol / Substance Use: Not Applicable Psych Involvement: No (comment)  Admission diagnosis:  Dehydration [E86.0] Hypokalemia [E87.6] Hypomagnesemia [E83.42] Acute encephalopathy [G93.40] Fever, unspecified fever cause [R50.9] Altered mental status, unspecified altered mental status type [R41.82] COVID [U07.1] Patient Active Problem List   Diagnosis Date Noted   Age-related cognitive decline 07/02/2024   Chronic ischemic heart disease 07/02/2024   Acute encephalopathy 07/02/2024   Lumbar radiculopathy 08/22/2021   Spinal stenosis, lumbar region, with neurogenic claudication 08/22/2021   Lumbar degenerative disc disease 08/22/2021   Lumbar spondylosis 08/22/2021   Chronic hepatitis (HCC) 08/11/2021   Viral upper respiratory tract infection 11/28/2020   Chest pain 04/27/2015   Allergic drug reaction 02/08/2015   B12 deficiency 02/08/2015   Cervical nerve root disorder 02/08/2015   Chest discomfort 02/08/2015   Vertigo 02/08/2015   Benign essential tremor 02/08/2015   Fatigue 02/08/2015   Acid reflux 02/08/2015   HLD (hyperlipidemia) 02/08/2015   BP (high blood pressure) 02/08/2015   Hernia, inguinal, left 02/08/2015   Cannot sleep 02/08/2015   CA of prostate (HCC) 02/08/2015   Type 2 diabetes mellitus with other circulatory complication, with long-term current use of insulin  (HCC)  Chronic pancreatitis, unspecified pancreatitis type [facility])    PCP:  [facility]:   [facility] [place], [place] - [address] [place] [address] Phone: [phone] Fax: [phone]  [facility] [phone] [place], [place] - [address] [place] [address] Phone: [phone] Fax: [phone]     Social Drivers of Health (SDOH) Social History: SDOH  Screenings   Food Insecurity: No Food Insecurity (07/03/2024)  Housing: Unknown (07/03/2024)  Transportation Needs: No Transportation Needs (07/03/2024)  Utilities: Not At Risk (07/03/2024)  Alcohol Screen: Low Risk  (12/11/2021)  Depression (PHQ2-9): Low Risk  (12/18/2021)  Financial Resource Strain: Low Risk  (12/11/2021)  Physical Activity: Sufficiently Active (12/11/2021)  Social Connections: Patient Unable To Answer (07/03/2024)  Stress: Stress Concern Present (12/11/2021)  Tobacco Use: Medium Risk (07/02/2024)   SDOH Interventions:     Readmission Risk Interventions     No data to display

## 2024-07-04 NOTE — Progress Notes (Signed)
 Palliative Care Progress Note, Assessment & Plan   Patient Name: Anthony Craig       Date: 07/04/2024 DOB: 12/24/1927  Age: 89 y.o. MRN#: [id number] Attending Physician: Awanda City, MD Primary Care Physician: [facility] Admit Date: 07/02/2024  Subjective: Patient reports feeling better today.  Denies pain.  8 all of his breakfast.  Denies chest pain/SOB.  HPI: 89 y.o. male  with past medical history significant for HTN, HLD, DM II, GERD, essential tremor, chronic pancreatitis, chronic hepatitis, prostate cancer, spinal stenosis and age-related cognitive decline. Patient presented to ED 07/02/2024 from [facility] memory care unit c/o AMS, fever and positive Covid test 2 days PTA.   ED labs significant for lactic 2.1, Na+ 133, K+ 2.9, Calcium  8.4, mag 1.5. Covid test +. CXR negative. UA positive for proteinuria and rare bacteria.    ED vitals 164/74, HR 91, SpO2 96% RA, 101.1 F   TRH was consulted for admission and management of encephalopathy on baseline dementia, feverr, Covid 19 infection, hypokalemia and hypomagnesemia.   Palliative care was consulted for assistance with goals of care conversations.  Summary of counseling/coordination of care: Extensive chart review completed prior to meeting patient including labs, vital signs, imaging, progress notes, orders, and available advanced directive documents from current and previous encounters.      Latest Ref Rng & Units 07/03/2024    5:18 AM 07/02/2024    2:00 PM 08/30/2023    4:30 PM  CBC  WBC 4.0 - 10.5 K/uL 3.9  7.7  5.8   Hemoglobin 13.0 - 17.0 g/dL 88.9  86.9  85.2   Hematocrit 39.0 - 52.0 % 33.4  39.2  43.6   Platelets 150 - 400 K/uL 146  176  167       Latest Ref Rng & Units 07/04/2024    2:48 AM 07/03/2024    5:18 AM  07/02/2024    2:00 PM  CMP  Glucose 70 - 99 mg/dL  91  778   BUN 8 - 23 mg/dL  14  13   Creatinine 9.38 - 1.24 mg/dL  9.45  8.94   Sodium 864 - 145 mmol/L  137  133   Potassium 3.5 - 5.1 mmol/L 3.3  3.1  2.9   Chloride 98 - 111 mmol/L  96  92   CO2 22 - 32 mmol/L  29  31   Calcium  8.9 - 10.3 mg/dL  8.2  8.4   Total Protein 6.5 - 8.1 g/dL  5.6  7.0   Total Bilirubin 0.0 - 1.2 mg/dL  0.4  0.7   Alkaline Phos 38 - 126 U/L  72  99   AST 15 - 41 U/L  17  25   ALT 0 - 44 U/L  12  12     After reviewing the patient's chart I assessed patient at bedside.  Ill-appearing, elderly male lying in bed with RN at bedside.  He is alert and oriented to self and month and day of birth but not year.  He is pleasantly confused and unable to correctly state patient, time or situation.  Respirations are even and unlabored.  He is in no distress.  Per note,  patient to return to facility when medically stable.  Physical Exam Vitals reviewed.  Constitutional:      General: He is not in acute distress.    Appearance: He is ill-appearing.  HENT:     Head: Normocephalic and atraumatic.     Mouth/Throat:     Mouth: Mucous membranes are moist.  Pulmonary:     Effort: Pulmonary effort is normal. No respiratory distress.  Musculoskeletal:     Right lower leg: No edema.     Left lower leg: No edema.  Skin:    General: Skin is warm and dry.  Neurological:     Mental Status: He is alert. He is disoriented.       Recommendations/Plan: DNR/DNI Continue current supportive interventions Plan to return to facility when medically stable Consult placed for outpatient palliative to follow at discharge Palliative will follow peripherally for needs  Total Time 50 minutes   Time spent includes: Detailed review of medical records (labs, imaging, vital signs), medically appropriate exam (mental status, respiratory, cardiac, skin), discussed with treatment team, counseling and educating patient, family and staff,  documenting clinical information, medication management and coordination of care.     Devere Sacks, [facility] Palliative Medicine Team  07/04/2024 12:40 PM  Office [phone]  Pager [phone]

## 2024-07-04 NOTE — Progress Notes (Signed)
  PROGRESS NOTE    Anthony Craig  FMW:[id number] DOB: 01/12/1928 DOA: 07/02/2024 [facility]: [facility]  128A/128A-AA  LOS: 1 day   Brief hospital course:   Assessment & Plan: Anthony Craig is a 89 y.o. male with medical history significant of hypertension, hyperlipidemia, diabetes, GERD, essential tremor, chronic pancreatitis, chronic hepatitis, prostate cancer, spinal stenosis,  age-related cognitive decline presenting with altered mental status.   Patient presenting from facility where he was diagnosed with COVID 2 days ago.  Has had persistent fever and now developed worsening encephalopathy.  son reports that for the past few weeks to months patient has had some generalized decline. A few weeks ago patient was able to walk but having difficulty sitting or turning without assistance. Has been largely bedbound and wheelchair-bound the last couple weeks including at his 89th birthday party at the memory care facility a week ago.    Encephalopathy on baseline dementia Fever COVID-19 > Patient recently diagnosed with COVID-19 at facility 2 days ago.  Has had persistent fevers and worsening altered mental status.  Presented for this altered mentation. > No leukocytosis, chest x-ray without pneumonia, urinalysis and blood cultures pending.  Suspect COVID-19 is indeed primary driver. > Received Tylenol  and 1 L IV fluids in the ED.  Started on Remdesivir, since d/c'ed. --no hypoxia, respiratory distress.  CXR clear  General decline and weakness --PT/OT --palliative care consult  Hypokalemia Hypomagnesemia --supplement PRN   Hypertension --IV hydralazine PRN for now   Hyperlipidemia CAD > Status post CABG 2001 - [facility]   Diabetes --ACHS and SSI   GERD - Continue PPI   Essential tremor - cont primidone    Chronic pancreatitis - Continue home Creon    History of pancreatic cancer > Approximately 15 years ago per son.  Was  initially given 5-year prognosis at that time. - Noted   History of prostate cancer History of cataract History of chronic hepatitis Spinal stenosis - Noted  Acute urinary retention --retaining >700 ml yesterday.  Foley inserted. --voiding trial tomorrow (can not return to ALF with Foley)   DVT prophylaxis: Lovenox SQ Code Status: DNR  Family Communication:  Level of care: Telemetry Medical Dispo:   The patient is from: ALF Anticipated d/c is to: to be determined Anticipated d/c date is: whenever disposition determined   Subjective and Interval History:  Eating well on his own.  No complaint.   Objective: Vitals:   07/04/24 1600 07/04/24 1613 07/04/24 1700 07/04/24 1800  BP:  (!) 149/83    Pulse:  66    Resp: 15 18 11 13   Temp:      TempSrc:      SpO2:  98%    Weight:      Height:        Intake/Output Summary (Last 24 hours) at 07/04/2024 1849 Last data filed at 07/04/2024 1605 Gross per 24 hour  Intake 843 ml  Output 3865 ml  Net -3022 ml   Filed Weights   07/02/24 1346 07/02/24 2113  Weight: 61.6 kg 59.4 kg    Examination:   Constitutional: NAD, alert HEENT: conjunctivae and lids normal, EOMI CV: No cyanosis.   RESP: normal respiratory effort, on RA Neuro: II - XII grossly intact.     Data Reviewed: I have personally reviewed labs and imaging studies  Time spent: 50 minutes  Ellouise Haber, MD [facility] If 7PM-7AM, please contact night-coverage 07/04/2024, 6:49 PM

## 2024-07-04 NOTE — Progress Notes (Signed)
 [facility] Liaison Note:  Notified by [facility] manager of patient/family request for [facility] Palliative services at [facility]F after discharge. Please call with any hospice or outpatient palliative care related questions. Thank you for the opportunity to participate in this patient's care.  Eleanor Nail, LPN [facility] Liaison [phone]

## 2024-07-04 NOTE — Plan of Care (Signed)
  Problem: Metabolic: Goal: Ability to maintain appropriate glucose levels will improve 07/04/2024 0609 by Melven Odilia PARAS, RN Outcome: Progressing 07/04/2024 0608 by Melven Odilia PARAS, RN Outcome: Progressing   Problem: Tissue Perfusion: Goal: Adequacy of tissue perfusion will improve 07/04/2024 0609 by Melven Odilia PARAS, RN Outcome: Progressing 07/04/2024 0608 by Melven Odilia PARAS, RN Outcome: Progressing   Problem: Clinical Measurements: Goal: Ability to maintain clinical measurements within normal limits will improve 07/04/2024 0609 by Melven Odilia PARAS, RN Outcome: Progressing 07/04/2024 0608 by Melven Odilia PARAS, RN Outcome: Progressing   Problem: Elimination: Goal: Will not experience complications related to bowel motility 07/04/2024 0609 by Melven Odilia PARAS, RN Outcome: Progressing 07/04/2024 0608 by Melven Odilia PARAS, RN Outcome: Progressing   Problem: Coping: Goal: Level of anxiety will decrease 07/04/2024 0609 by Melven Odilia PARAS, RN Outcome: Progressing 07/04/2024 0608 by Melven Odilia PARAS, RN Outcome: Progressing   Problem: Safety: Goal: Ability to remain free from injury will improve 07/04/2024 0609 by Melven Odilia PARAS, RN Outcome: Progressing 07/04/2024 0608 by Melven Odilia PARAS, RN Outcome: Progressing

## 2024-07-04 NOTE — Evaluation (Signed)
 Physical Therapy Evaluation Patient Details Name: Thaddeaus Monica MRN: [id number] DOB: Nov 26, 1927 Today's Date: 07/04/2024  History of Present Illness  Nikolaos Maddocks is a 89 y.o. male with medical history significant of hypertension, hyperlipidemia, diabetes, GERD, essential tremor, chronic pancreatitis, chronic hepatitis, prostate cancer, spinal stenosis,  age-related cognitive decline presenting with altered mental status.  Clinical Impression  Pt is pleasant 89 y.o. male admitted for altered mental status. Pt with poor cognition and no caregiver or family member available to determine baseline. Pt is from [facility] memory care and per previous notes, pt recently began using manual w/c 2 weeks ago d/t progressing weakness. Prior to use of w/c pt able to amb with walker. Pt requires MAX A for rolling; pt required max cuing for hand placement and sequencing.  Attempt to sit at EOB was made however pt unable to perform safely despite max multi-modal cuing for attempt. Pt able to perform SLR and has good gross strength, however unable to test transfers/ambulation d/t safety. Pt presents with deficits in strength/activity tolerance. Would benefit from skilled PT to address above deficits and promote optimal return to PLOF.     If plan is discharge home, recommend the following: A lot of help with walking and/or transfers;A lot of help with bathing/dressing/bathroom;Direct supervision/assist for medications management;Supervision due to cognitive status   Can travel by private vehicle   No    Equipment Recommendations Other (comment) (TBD at next venue)  Recommendations for Other Services       Functional Status Assessment Patient has had a recent decline in their functional status and demonstrates the ability to make significant improvements in function in a reasonable and predictable amount of time.     Precautions / Restrictions Precautions Precautions: Fall Recall of  Precautions/Restrictions: Impaired Restrictions Weight Bearing Restrictions Per Provider Order: No      Mobility  Bed Mobility Overal bed mobility: Needs Assistance Bed Mobility: Rolling Rolling: Max assist         General bed mobility comments: MAX A for rolling for hand placement and sequencing, suspect d/t poor cognition.    Transfers Overall transfer level: Needs assistance                 General transfer comment: unsafe to attempt d/t poor cognition    Ambulation/Gait               General Gait Details: unsafe to attempt d/t poor cognition  Stairs            Wheelchair Mobility     Tilt Bed    Modified Rankin (Stroke Patients Only)       Balance                                             Pertinent Vitals/Pain Pain Assessment Pain Assessment: No/denies pain Breathing: normal Negative Vocalization: none Facial Expression: smiling or inexpressive Body Language: relaxed Consolability: no need to console PAINAD Score: 0    Home Living Family/patient expects to be discharged to:: Other (Comment)                   Additional Comments: [facility]    Prior Function Prior Level of Function : Needs assist             Mobility Comments: Per previous notes, pt is from [facility] care and  is wc bound at baseline. ADLs Comments: Pt requires assistance with ADLs. Pt unable to provide info on how much assist needed.     Extremity/Trunk Assessment   Upper Extremity Assessment Upper Extremity Assessment: Generalized weakness    Lower Extremity Assessment Lower Extremity Assessment: Generalized weakness    Cervical / Trunk Assessment Cervical / Trunk Assessment: Normal  Communication   Communication Communication: Impaired Factors Affecting Communication: Hearing impaired    Cognition Arousal: Alert Behavior During Therapy: WFL for tasks assessed/performed   PT - Cognitive  impairments: History of cognitive impairments                       PT - Cognition Comments: Pt pleasant and agreeable to PT session. Unable to provide accurate answers for questions asked, and unable to follow verbal instruction despite cuing provided. Following commands: Impaired Following commands impaired: Follows one step commands inconsistently, Follows one step commands with increased time     Cueing Cueing Techniques: Verbal cues, Tactile cues, Visual cues     General Comments      Exercises     Assessment/Plan    PT Assessment All further PT needs can be met in the next venue of care  PT Problem List Decreased strength;Decreased activity tolerance;Decreased balance;Decreased mobility;Decreased cognition;Decreased knowledge of use of DME;Decreased safety awareness       PT Treatment Interventions      PT Goals (Current goals can be found in the Care Plan section)  Acute Rehab PT Goals Patient Stated Goal: none stated PT Goal Formulation: Patient unable to participate in goal setting Time For Goal Achievement: 07/18/24 Potential to Achieve Goals: Fair    Frequency       Co-evaluation               AM-PAC PT 6 Clicks Mobility  Outcome Measure Help needed turning from your back to your side while in a flat bed without using bedrails?: A Lot Help needed moving from lying on your back to sitting on the side of a flat bed without using bedrails?: A Lot Help needed moving to and from a bed to a chair (including a wheelchair)?: Total Help needed standing up from a chair using your arms (e.g., wheelchair or bedside chair)?: Total Help needed to walk in hospital room?: Total Help needed climbing 3-5 steps with a railing? : Total 6 Click Score: 8    End of Session   Activity Tolerance: Patient tolerated treatment well Patient left: in bed;with call bell/phone within reach;with bed alarm set Nurse Communication: Mobility status PT Visit Diagnosis:  Muscle weakness (generalized) (M62.81);Difficulty in walking, not elsewhere classified (R26.2)    Time: 8675-8652 PT Time Calculation (min) (ACUTE ONLY): 23 min   Charges:                 Vinny Taranto, SPT   Lynwood Kubisiak 07/04/2024, 3:07 PM

## 2024-07-05 DIAGNOSIS — G9340 Encephalopathy, unspecified: Secondary | ICD-10-CM | POA: Diagnosis not present

## 2024-07-05 LAB — GLUCOSE, CAPILLARY
Glucose-Capillary: 111 mg/dL — ABNORMAL HIGH (ref 70–99)
Glucose-Capillary: 182 mg/dL — ABNORMAL HIGH (ref 70–99)
Glucose-Capillary: 199 mg/dL — ABNORMAL HIGH (ref 70–99)
Glucose-Capillary: 283 mg/dL — ABNORMAL HIGH (ref 70–99)
Glucose-Capillary: 294 mg/dL — ABNORMAL HIGH (ref 70–99)
Glucose-Capillary: 315 mg/dL — ABNORMAL HIGH (ref 70–99)

## 2024-07-05 LAB — POTASSIUM: Potassium: 3.5 mmol/L (ref 3.5–5.1)

## 2024-07-05 MED ORDER — INSULIN ASPART 100 UNIT/ML IJ SOLN
0.0000 [IU] | Freq: Three times a day (TID) | INTRAMUSCULAR | Status: DC
  Administered 2024-07-06: 5 [IU] via SUBCUTANEOUS
  Administered 2024-07-06: 1 [IU] via SUBCUTANEOUS
  Administered 2024-07-06 – 2024-07-07 (×4): 3 [IU] via SUBCUTANEOUS
  Administered 2024-07-08: 5 [IU] via SUBCUTANEOUS
  Filled 2024-07-05 (×7): qty 1

## 2024-07-05 MED ORDER — INSULIN ASPART 100 UNIT/ML IJ SOLN
3.0000 [IU] | Freq: Three times a day (TID) | INTRAMUSCULAR | Status: DC
  Administered 2024-07-06 – 2024-07-08 (×7): 3 [IU] via SUBCUTANEOUS
  Filled 2024-07-05 (×7): qty 1

## 2024-07-05 NOTE — Plan of Care (Signed)
  Problem: Coping: Goal: Ability to adjust to condition or change in health will improve Outcome: Progressing   Problem: Metabolic: Goal: Ability to maintain appropriate glucose levels will improve Outcome: Progressing   Problem: Tissue Perfusion: Goal: Adequacy of tissue perfusion will improve Outcome: Progressing   Problem: Clinical Measurements: Goal: Ability to maintain clinical measurements within normal limits will improve Outcome: Progressing   Problem: Safety: Goal: Ability to remain free from injury will improve Outcome: Progressing   Problem: Pain Managment: Goal: General experience of comfort will improve and/or be controlled Outcome: Progressing

## 2024-07-05 NOTE — Inpatient Diabetes Management (Signed)
 Inpatient Diabetes Program Recommendations  AACE/ADA: New Consensus Statement on Inpatient Glycemic Control (2015)  Target Ranges:  Prepandial:   less than 140 mg/dL      Peak postprandial:   less than 180 mg/dL (1-2 hours)      Critically ill patients:  140 - 180 mg/dL    Latest Reference Range & Units 07/04/24 00:03 07/04/24 02:10 07/04/24 06:12 07/04/24 08:45 07/04/24 12:11 07/04/24 15:49 07/04/24 16:13 07/04/24 20:10  Glucose-Capillary 70 - 99 mg/dL 705 (H) 792 (H)  3 units Novolog   115 (H) 125 (H)  1 unit Novolog   191 (H)  2 units Novolog   281 (H) 267 (H)  5 units Novolog   236 (H)  3 units Novolog    (H): Data is abnormally high  Latest Reference Range & Units 07/05/24 00:07 07/05/24 04:19 07/05/24 08:32  Glucose-Capillary 70 - 99 mg/dL 800 (H)  2 units Novolog   182 (H)  2 units Novolog   111 (H)  (H): Data is abnormally high     Home DM Meds: Lantus 9 units BID Humalog 5 units TID   Current Orders: Novolog  Sensitive Correction Scale/ SSI (0-9 units) Q4H     MD- Note afternoon CBGs elevated yesterday.  Pt allowed PO diet.  Please consider:  1. Change Novolog  SSI to TID AC + HS (currently ordered Q4H)  2. Start Novolog  Meal Coverage: Novolog  3 units TID with meals HOLD if pt NPO HOLD if pt eats <50% meals    --Will follow patient during hospitalization--  Adina Rudolpho Arrow RN, MSN, CDCES Diabetes Coordinator Inpatient Glycemic Control Team Team Pager: [phone] (8a-5p)

## 2024-07-05 NOTE — Progress Notes (Addendum)
 PROGRESS NOTE    Anthony Craig  FMW:[id number] DOB: Nov 26, 1927 DOA: 07/02/2024 PCP: [facility]  [id number]/[id number]-AA  LOS: 2 days   Brief hospital course:   Assessment & Plan: Anthony Craig is a 89 y.o. male with medical history significant of hypertension, hyperlipidemia, diabetes, GERD, essential tremor, chronic pancreatitis, chronic hepatitis, prostate cancer, spinal stenosis,  age-related cognitive decline presenting with altered mental status.   Patient presenting from facility where he was diagnosed with COVID 2 days ago.  Has had persistent fever and now developed worsening encephalopathy.  son reports that for the past few weeks to months patient has had some generalized decline. A few weeks ago patient was able to walk but having difficulty sitting or turning without assistance. Has been largely bedbound and wheelchair-bound the last couple weeks including at his 89th birthday party at the memory care facility a week ago.    Acute metabolic Encephalopathy on baseline dementia Fever COVID-19 > Patient recently diagnosed with COVID-19 at facility 2 days ago.  Has had persistent fevers and worsening altered mental status.  Presented for this altered mentation. > No leukocytosis, chest x-ray without pneumonia, urinalysis and blood cultures pending.  Suspect COVID-19 is indeed primary driver. > Received Tylenol  and 1 L IV fluids in the ED.  Started on Remdesivir, since d/c'ed. --no hypoxia, respiratory distress.  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  Was initially given 5-year prognosis at that time. - Noted   History of prostate cancer History of cataract History of chronic hepatitis Spinal stenosis - Noted  Acute urinary retention --retaining >700 ml.  Foley inserted on 9/28. --remove foley and perform voiding trial today (pt can not return to ALF with Foley)  Troponin elevation likely demand ischemia --trop 428, 345.  No chest pain.  Severe sepsis due to Covid-19  --temp 101.6, HR 104, encephalopathy, lactic acid 2.1    DVT prophylaxis: Lovenox SQ Code Status: DNR  Family Communication:  Level of care: Telemetry Medical Dispo:   The patient is from: ALF Anticipated d/c is to: to be determined Anticipated d/c date is: whenever disposition determined   Subjective and Interval History:  No new issue today.  Plan for voiding trial today.   Objective: Vitals:   07/05/24 0004 07/05/24 0417 07/05/24 0758 07/05/24 1932  BP: (!) 173/77 (!) 155/82 (!) 166/82 (!) 178/92  Pulse: 62 61 61 70  Resp: 16 16 18 16   Temp: 98.4 F (36.9 C) 98.1 F (36.7 C) 97.9 F (36.6 C) 98.4 F (36.9 C)  TempSrc:   Oral   SpO2: 99% 99% 98% 99%  Weight:      Height:        Intake/Output Summary (Last 24 hours) at 07/05/2024 2233 Last data filed at 07/05/2024 0500 Gross per 24 hour  Intake 3 ml  Output 750 ml  Net -747 ml   Filed Weights   07/02/24 1346 07/02/24 2113  Weight: 61.6 kg 59.4 kg    Examination:   Constitutional: NAD,  alert HEENT: conjunctivae and lids normal, EOMI CV: No cyanosis.   RESP: normal respiratory effort, on RA   Data Reviewed: I have personally reviewed labs and imaging studies  Time spent: 35 minutes  Ellouise Haber, MD [facility] If 7PM-7AM, please contact night-coverage 07/05/2024, 10:33 PM

## 2024-07-06 DIAGNOSIS — G9340 Encephalopathy, unspecified: Secondary | ICD-10-CM | POA: Diagnosis not present

## 2024-07-06 LAB — GLUCOSE, CAPILLARY
Glucose-Capillary: 247 mg/dL — ABNORMAL HIGH (ref 70–99)
Glucose-Capillary: 269 mg/dL — ABNORMAL HIGH (ref 70–99)
Glucose-Capillary: 136 mg/dL — ABNORMAL HIGH (ref 70–99)
Glucose-Capillary: 275 mg/dL — ABNORMAL HIGH (ref 70–99)

## 2024-07-06 MED ORDER — HYDRALAZINE HCL 20 MG/ML IJ SOLN: 10.0000 mg | Freq: Four times a day (QID) | INTRAMUSCULAR | Status: DC | PRN

## 2024-07-06 MED ORDER — LABETALOL HCL 5 MG/ML IV SOLN: 10.0000 mg | INTRAVENOUS | Status: DC | PRN

## 2024-07-06 MED ORDER — TAMSULOSIN HCL 0.4 MG PO CAPS
0.4000 mg | ORAL_CAPSULE | Freq: Every day | ORAL | Status: DC
  Administered 2024-07-06 – 2024-07-08 (×3): 0.4 mg via ORAL
  Filled 2024-07-06 (×3): qty 1

## 2024-07-06 NOTE — Plan of Care (Signed)
                                                     Palliative Care Progress Note   Patient Name: Anthony Craig       Date: 07/06/2024 DOB: [birth year]  Age: 89 y.o. MRN#: [id number] Attending Physician: Dezii, Alexandra, DO Primary Care Physician: [facility] Admit Date: 07/02/2024  Extensive chart review completed including labs, vital signs, imaging, progress notes, orders, and available advanced directive documents from current and previous encounters.   PMT is monitoring the patient peripherally and will re-engage where appropriate. No acute palliative needs today. PMT remains available to patient and family. Please engage with PMT if goals change, at patient/family's request, or if patient's health deteriorates during hospitalization.   Thank you for allowing the Palliative Medicine Team to assist in the care of [facility].  Lamarr L. Arvid, DNP, FNP-BC Palliative Medicine Team  No charge

## 2024-07-06 NOTE — Progress Notes (Signed)
 PROGRESS NOTE    Anthony Craig  FMW:[id number] DOB: [birth year] DOA: 07/02/2024 PCP: [facility]  Chief Complaint  Patient presents with   Altered Mental Status    Hospital Course:  Anthony Craig is a 89 year old male with hypertension, hyperlipidemia, diabetes, GERD, essential tremor, chronic pancreatitis, chronic hepatitis, prostate cancer, spinal stenosis, age-related cognitive decline who presented with altered mental status.  Patient initially presented from his facility where he was diagnosed with COVID-19 2 days prior.  Prior to his COVID diagnosis he has been experiencing generalized decline over the last many months.  Been largely bedbound and wheelchair-bound for the last couple of weeks.  Subjective: This morning patient has no acute complaints   Objective: Vitals:   07/05/24 1932 07/06/24 0454 07/06/24 0856 07/06/24 1609  BP: (!) 178/92 (!) 175/83 (!) 160/145 133/69  Pulse: 70 65 65 65  Resp: 16 20 18 18   Temp: 98.4 F (36.9 C) 97.9 F (36.6 C) 98 F (36.7 C) 98 F (36.7 C)  TempSrc:      SpO2: 99% 99% 98% 99%  Weight:      Height:       No intake or output data in the 24 hours ending 07/06/24 1646 Filed Weights   07/02/24 1346 07/02/24 2113  Weight: 61.6 kg 59.4 kg    Examination: General exam: Appears calm and comfortable, NAD  Respiratory system: No work of breathing, symmetric chest wall expansion Cardiovascular system: S1 & S2 heard, RRR.  Gastrointestinal system: Abdomen is nondistended, soft and nontender.  Neuro: Alert and oriented to self.  Assessment & Plan:  Principal Problem:   Acute encephalopathy Active Problems:   Type 2 diabetes mellitus with other circulatory complication, with long-term current use of insulin  (HCC)   Benign essential tremor   Acid reflux   HLD (hyperlipidemia)   BP (high blood pressure)   Chronic pancreatitis, unspecified pancreatitis type (HCC)   CA of prostate (HCC)   Chronic hepatitis  (HCC)   Spinal stenosis, lumbar region, with neurogenic claudication   Acute metabolic encephalopathy superimposed on dementia Fever COVID-19 Sepsis - Sepsis criteria: Fever, tachycardia, encephalopathy, lactic acidosis - Metabolic encephalopathy in setting of COVID-19 - No leukocytosis, or superimposed bacterial pneumonia.  Patient briefly received remdesivir which is since been discontinued - Continue symptomatic support - No hypoxia or respiratory distress - Continue frequent reorientation and delirium precautions  General decline Weakness - Palliative care consulted - Patient resides in assisted living facility - Continue PT/OT  Hypokalemia Hypomagnesemia - Replace as needed  Hypertension - Add hydralazine  Hyperlipidemia CAD - CABG 2001 - Continue home meds  Diabetes, poorly controlled - Hemoglobin A1c 9.5% - Continue basal/bolus with sliding scale insulin , titrate as needed  GERD - Continue PPI  Essential tremor - Continue home meds  Chronic pancreatitis - Continue home meds  History of pancreatic cancer - Reportedly diagnosed 15 years prior  History of prostate cancer History of cataracts History of hepatitis Spinal stenosis - Stable.  Aware.  Acute urinary retention - Foley catheter inserted on 07-19-24.  Has passed voiding trial today.  Troponin elevation - Troponin 428 then 345.  No chest pain.  Thought to be demand ischemia    DVT prophylaxis: lovenox   Code Status: Limited: Do not attempt resuscitation (DNR) -DNR-LIMITED -Do Not Intubate/DNI  Disposition:  Pending return to ALF  Consultants:    Procedures:    Antimicrobials:  Anti-infectives (From admission, onward)    Start     Dose/Rate  Route Frequency Ordered Stop   07/03/24 1000  remdesivir 100 mg in sodium chloride  0.9 % 100 mL IVPB  Status:  Discontinued       Placed in Followed by Linked Group   100 mg 200 mL/hr over 30 Minutes Intravenous Daily 07/02/24 1632 07/03/24  1425   07/02/24 1800  remdesivir 200 mg in sodium chloride  0.9% 250 mL IVPB       Placed in Followed by Linked Group   200 mg 580 mL/hr over 30 Minutes Intravenous Once 07/02/24 1632 07/02/24 1847       Data Reviewed: I have personally reviewed following labs and imaging studies CBC: Recent Labs  Lab 07/02/24 1400 07/03/24 0518  WBC 7.7 3.9*  NEUTROABS 6.9  --   HGB 13.0 11.0*  HCT 39.2 33.4*  MCV 80.7 81.1  PLT 176 146*   Basic Metabolic Panel: Recent Labs  Lab 07/02/24 1400 07/03/24 0518 07/04/24 0248 07/05/24 0414  NA 133* 137  --   --   K 2.9* 3.1* 3.3* 3.5  CL 92* 96*  --   --   CO2 31 29  --   --   GLUCOSE 221* 91  --   --   BUN 13 14  --   --   CREATININE 1.05 0.54*  --   --   CALCIUM  8.4* 8.2*  --   --   MG 1.5* 1.8  --   --    GFR: Estimated Creatinine Clearance: 47.4 mL/min (A) (by C-G formula based on SCr of 0.54 mg/dL (L)). Liver Function Tests: Recent Labs  Lab 07/02/24 1400 07/03/24 0518  AST 25 17  ALT 12 12  ALKPHOS 99 72  BILITOT 0.7 0.4  PROT 7.0 5.6*  ALBUMIN 3.6 2.8*   CBG: Recent Labs  Lab 07/05/24 1723 07/05/24 1935 07/06/24 0849 07/06/24 1215 07/06/24 1610  GLUCAP 294* 283* 247* 269* 136*    Recent Results (from the past 240 hours)  Blood Culture (routine x 2)     Status: None (Preliminary result)   Collection Time: 07/02/24  1:52 PM   Specimen: BLOOD  Result Value Ref Range Status   Specimen Description BLOOD BLOOD RIGHT FOREARM  Final   Special Requests   Final    BOTTLES DRAWN AEROBIC AND ANAEROBIC Blood Culture adequate volume   Culture   Final    NO GROWTH 4 DAYS Performed at [facility], [address] [place], [place] [address]    Report Status PENDING  Incomplete  Blood Culture (routine x 2)     Status: None (Preliminary result)   Collection Time: 07/02/24  2:00 PM   Specimen: BLOOD RIGHT ARM  Result Value Ref Range Status   Specimen Description BLOOD RIGHT ARM  Final   Special Requests    Final    BOTTLES DRAWN AEROBIC AND ANAEROBIC Blood Culture adequate volume   Culture   Final    NO GROWTH 4 DAYS Performed at [facility], [address], [place], [place] [address]    Report Status PENDING  Incomplete  Resp panel by RT-PCR (RSV, Flu A&B, Covid) Anterior Nasal Swab     Status: Abnormal   Collection Time: 07/02/24  6:48 PM   Specimen: Anterior Nasal Swab  Result Value Ref Range Status   SARS Coronavirus 2 by RT PCR POSITIVE (A) NEGATIVE Final    Comment: (NOTE) SARS-CoV-2 target nucleic acids are DETECTED.  The SARS-CoV-2 RNA is generally detectable in upper respiratory specimens during  the acute phase of infection. Positive results are indicative of the presence of the identified virus, but do not rule out bacterial infection or co-infection with other pathogens not detected by the test. Clinical correlation with patient history and other diagnostic information is necessary to determine patient infection status. The expected result is Negative.  Fact Sheet for Patients: BloggerCourse.com  Fact Sheet for Healthcare Providers: SeriousBroker.it  This test is not yet approved or cleared by the [place]  FDA and  has been authorized for detection and/or diagnosis of SARS-CoV-2 by FDA under an Emergency Use Authorization (EUA).  This EUA will remain in effect (meaning this test can be used) for the duration of  the COVID-19 declaration under Section 564(b)(1) of the A ct, 21 U.S.C. section 360bbb-3(b)(1), unless the authorization is terminated or revoked sooner.     Influenza A by PCR NEGATIVE NEGATIVE Final   Influenza B by PCR NEGATIVE NEGATIVE Final    Comment: (NOTE) The Xpert Xpress SARS-CoV-2/FLU/RSV plus assay is intended as an aid in the diagnosis of influenza from Nasopharyngeal swab specimens and should not be used as a sole basis for treatment. Nasal washings and aspirates are  unacceptable for Xpert Xpress SARS-CoV-2/FLU/RSV testing.  Fact Sheet for Patients: BloggerCourse.com  Fact Sheet for Healthcare Providers: SeriousBroker.it  This test is not yet approved or cleared by the [place]  FDA and has been authorized for detection and/or diagnosis of SARS-CoV-2 by FDA under an Emergency Use Authorization (EUA). This EUA will remain in effect (meaning this test can be used) for the duration of the COVID-19 declaration under Section 564(b)(1) of the Act, 21 U.S.C. section 360bbb-3(b)(1), unless the authorization is terminated or revoked.     Resp Syncytial Virus by PCR NEGATIVE NEGATIVE Final    Comment: (NOTE) Fact Sheet for Patients: BloggerCourse.com  Fact Sheet for Healthcare Providers: SeriousBroker.it  This test is not yet approved or cleared by the [place]  FDA and has been authorized for detection and/or diagnosis of SARS-CoV-2 by FDA under an Emergency Use Authorization (EUA). This EUA will remain in effect (meaning this test can be used) for the duration of the COVID-19 declaration under Section 564(b)(1) of the Act, 21 U.S.C. section 360bbb-3(b)(1), unless the authorization is terminated or revoked.  Performed at [facility], [address], [place], [place] [address]      Radiology Studies: No results found.  Scheduled Meds:  Chlorhexidine Gluconate Cloth  6 each Topical Daily   enoxaparin (LOVENOX) injection  40 mg Subcutaneous Q24H   insulin  aspart  0-9 Units Subcutaneous TID WC   insulin  aspart  3 Units Subcutaneous TID WC   lipase/protease/amylase  36,000 Units Oral TID WC   pantoprazole   40 mg Oral Daily   primidone   100 mg Oral QHS   sertraline   100 mg Oral q morning   sodium chloride  flush  3 mL Intravenous Q12H   tamsulosin  0.4 mg Oral QPC breakfast   Continuous Infusions:   LOS: 3 days  MDM:  Patient is high risk for one or more organ failure.  They necessitate ongoing hospitalization for continued IV therapies and subsequent lab monitoring. Total time spent interpreting labs and vitals, reviewing the medical record, coordinating care amongst consultants and care team members, directly assessing and discussing care with the patient and/or family: 55 min Hildred Pharo, DO [facility]  To contact the attending physician between 7A-7P please use Epic Chat. To contact the covering physician during after hours 7P-7A, please review Amion.  07/06/2024, 4:46 PM   *This document has been created with the assistance of dictation software. Please excuse typographical errors. *

## 2024-07-06 NOTE — Plan of Care (Signed)
  Problem: Education: Goal: Ability to describe self-care measures that may prevent or decrease complications (Diabetes Survival Skills Education) will improve Outcome: Adequate for Discharge   Problem: Coping: Goal: Ability to adjust to condition or change in health will improve Outcome: Adequate for Discharge   Problem: Health Behavior/Discharge Planning: Goal: Ability to identify and utilize available resources and services will improve Outcome: Adequate for Discharge   Problem: Nutritional: Goal: Maintenance of adequate nutrition will improve Outcome: Adequate for Discharge   Problem: Skin Integrity: Goal: Risk for impaired skin integrity will decrease Outcome: Adequate for Discharge

## 2024-07-06 NOTE — Care Management Important Message (Signed)
 Important Message  Patient Details  Name: Anthony Craig MRN: [id number] Date of Birth: 22-Jan-1928   Important Message Given:  Yes - Medicare IM     Courtland Coppa W, CMA 07/06/2024, 11:18 AM

## 2024-07-06 NOTE — TOC Progression Note (Addendum)
 Transition of Care [facility]) - Progression Note    Patient Details  Name: Anthony Craig MRN: [id number] Date of Birth: 09/23/1928  [facility]) CM/SW Contact  Dalia GORMAN Fuse, RN Phone Number: 07/06/2024, 1:13 PM  Clinical Narrative:    TOC placed call to [facility] and spoke with [facility]. Per Rolland, the nurse makes the decisions related to admitting patients. TOC lvmm for Aliyah RN.  1545: TOC placed call to [facility] and spoke with Hadassah [phone]. It has been greater than 72 hours since the patient admitted. They need to reassess the patient before he can return. She will reassess the patient tomorrow morning.  Expected Discharge Plan: Memory Care Barriers to Discharge: Continued Medical Work up               Expected Discharge Plan and Services     Post Acute Care Choice: Resumption of Svcs/PTA Provider Living arrangements for the past 2 months: Assisted Living Facility (Memory Care)                                       Social Drivers of Health (SDOH) Interventions SDOH Screenings   Food Insecurity: No Food Insecurity (07/03/2024)  Housing: Unknown (07/03/2024)  Transportation Needs: No Transportation Needs (07/03/2024)  Utilities: Not At Risk (07/03/2024)  Alcohol Screen: Low Risk  (12/11/2021)  Depression (PHQ2-9): Low Risk  (12/18/2021)  Financial Resource Strain: Low Risk  (12/11/2021)  Physical Activity: Sufficiently Active (12/11/2021)  Social Connections: Patient Unable To Answer (07/03/2024)  Stress: Stress Concern Present (12/11/2021)  Tobacco Use: Medium Risk (07/02/2024)    Readmission Risk Interventions     No data to display

## 2024-07-06 NOTE — Plan of Care (Signed)
  Problem: Fluid Volume: Goal: Ability to maintain a balanced intake and output will improve Outcome: Progressing   Problem: Health Behavior/Discharge Planning: Goal: Ability to manage health-related needs will improve Outcome: Progressing   Problem: Skin Integrity: Goal: Risk for impaired skin integrity will decrease Outcome: Progressing   Problem: Health Behavior/Discharge Planning: Goal: Ability to manage health-related needs will improve Outcome: Progressing   Problem: Clinical Measurements: Goal: Will remain free from infection Outcome: Progressing

## 2024-07-07 DIAGNOSIS — G9340 Encephalopathy, unspecified: Secondary | ICD-10-CM | POA: Diagnosis not present

## 2024-07-07 LAB — CULTURE, BLOOD (ROUTINE X 2)
Culture: NO GROWTH
Culture: NO GROWTH
Special Requests: ADEQUATE
Special Requests: ADEQUATE

## 2024-07-07 LAB — GLUCOSE, CAPILLARY
Glucose-Capillary: 245 mg/dL — ABNORMAL HIGH (ref 70–99)
Glucose-Capillary: 292 mg/dL — ABNORMAL HIGH (ref 70–99)
Glucose-Capillary: 225 mg/dL — ABNORMAL HIGH (ref 70–99)
Glucose-Capillary: 194 mg/dL — ABNORMAL HIGH (ref 70–99)

## 2024-07-07 MED ORDER — LISINOPRIL 5 MG PO TABS
5.0000 mg | ORAL_TABLET | Freq: Every day | ORAL | Status: DC
  Administered 2024-07-07 – 2024-07-08 (×2): 5 mg via ORAL
  Filled 2024-07-07 (×2): qty 1

## 2024-07-07 NOTE — Plan of Care (Signed)
  Problem: Education: Goal: Ability to describe self-care measures that may prevent or decrease complications (Diabetes Survival Skills Education) will improve Outcome: Progressing   Problem: Health Behavior/Discharge Planning: Goal: Ability to identify and utilize available resources and services will improve Outcome: Progressing   Problem: Nutritional: Goal: Maintenance of adequate nutrition will improve Outcome: Progressing   Problem: Education: Goal: Knowledge of General Education information will improve Description: Including pain rating scale, medication(s)/side effects and non-pharmacologic comfort measures Outcome: Progressing   Problem: Clinical Measurements: Goal: Respiratory complications will improve Outcome: Progressing   Problem: Pain Managment: Goal: General experience of comfort will improve and/or be controlled Outcome: Progressing   Problem: Skin Integrity: Goal: Risk for impaired skin integrity will decrease Outcome: Progressing

## 2024-07-07 NOTE — Progress Notes (Signed)
 Physical Therapy Treatment Patient Details Name: Anthony Craig MRN: [id number] DOB: 1928/07/08 Today's Date: 07/07/2024   History of Present Illness Anthony Craig is a 89 y.o. male with medical history significant of hypertension, hyperlipidemia, diabetes, GERD, essential tremor, chronic pancreatitis, chronic hepatitis, prostate cancer, spinal stenosis,  age-related cognitive decline presenting with altered mental status.    PT Comments  Pt seen for PT tx with pt agreeable. Pt follows simple commands with multimodal cuing throughout session. Pt is able to complete bed mobility with supervision, requires mod<>max assist for sit<>stand depending upon height of surface. Pt ambulates in room/bathroom with RW & mod assist, assistance for RW management. Pt with incontinent BM throughout session requiring total assist for peri hygiene. Recommend ongoing PT services to progress mobility as able.    If plan is discharge home, recommend the following: A lot of help with walking and/or transfers;A lot of help with bathing/dressing/bathroom;Direct supervision/assist for medications management;Supervision due to cognitive status   Can travel by private vehicle     No  Equipment Recommendations  Other (comment) (defer to next venue)    Recommendations for Other Services       Precautions / Restrictions Precautions Precautions: Fall Recall of Precautions/Restrictions: Impaired Restrictions Weight Bearing Restrictions Per Provider Order: No     Mobility  Bed Mobility Overal bed mobility: Needs Assistance Bed Mobility: Supine to Sit     Supine to sit: Supervision, HOB elevated (able to transition supine>sitting EOB on L with extra time, cuing to continue movement, supervision)          Transfers Overall transfer level: Needs assistance Equipment used: 1 person hand held assist, Rolling walker (2 wheels) Transfers: Sit to/from Stand, Bed to chair/wheelchair/BSC Sit to Stand:  Mod assist   Step pivot transfers: Mod assist (assistance for eccentric lowering to recliner)       General transfer comment: Sit>stand from elevated EOB with HHA, sit<>stand from recliner with RW & mod assist, low toilet with max assist with cuing re: hand placement.    Ambulation/Gait Ambulation/Gait assistance: Mod assist Gait Distance (Feet): 15 Feet (+ 15 ft) Assistive device: Rolling walker (2 wheels) Gait Pattern/deviations: Decreased step length - right, Decreased step length - left, Decreased dorsiflexion - left, Decreased dorsiflexion - right, Decreased stride length Gait velocity: decreased     General Gait Details: assistance with RW management, pt pushes RW out in front of him   Stairs             Wheelchair Mobility     Tilt Bed    Modified Rankin (Stroke Patients Only)       Balance Overall balance assessment: Needs assistance Sitting-balance support: Feet supported Sitting balance-Leahy Scale: Fair     Standing balance support: During functional activity, Bilateral upper extremity supported, Reliant on assistive device for balance Standing balance-Leahy Scale: Poor                              Communication Communication Communication: Impaired Factors Affecting Communication: Hearing impaired  Cognition Arousal: Alert Behavior During Therapy: WFL for tasks assessed/performed   PT - Cognitive impairments: History of cognitive impairments                       PT - Cognition Comments: Pt unaware of incontinent BMs during session. Following commands: Impaired Following commands impaired: Follows one step commands inconsistently, Follows one step commands with increased time  Cueing Cueing Techniques: Verbal cues, Tactile cues, Visual cues, Gestural cues  Exercises      General Comments General comments (skin integrity, edema, etc.): incontinent of bowel & bladder, used toilet, assistance for peri hygiene &  changing into clean gown      Pertinent Vitals/Pain Pain Assessment Pain Assessment: PAINAD Breathing: normal Negative Vocalization: none Facial Expression: smiling or inexpressive Body Language: relaxed Consolability: no need to console PAINAD Score: 0    Home Living                          Prior Function            PT Goals (current goals can now be found in the care plan section) Acute Rehab PT Goals Patient Stated Goal: none stated PT Goal Formulation: Patient unable to participate in goal setting Time For Goal Achievement: 07/18/24 Potential to Achieve Goals: Fair Progress towards PT goals: Progressing toward goals    Frequency    Min 2X/week      PT Plan      Co-evaluation              AM-PAC PT 6 Clicks Mobility   Outcome Measure  Help needed turning from your back to your side while in a flat bed without using bedrails?: A Little Help needed moving from lying on your back to sitting on the side of a flat bed without using bedrails?: A Little Help needed moving to and from a bed to a chair (including a wheelchair)?: A Little Help needed standing up from a chair using your arms (e.g., wheelchair or bedside chair)?: A Lot Help needed to walk in hospital room?: A Lot Help needed climbing 3-5 steps with a railing? : Total 6 Click Score: 14    End of Session   Activity Tolerance: Patient tolerated treatment well Patient left: in chair;with chair alarm set;with call bell/phone within reach;with nursing/sitter in room Nurse Communication: Mobility status PT Visit Diagnosis: Muscle weakness (generalized) (M62.81);Difficulty in walking, not elsewhere classified (R26.2);Unsteadiness on feet (R26.81);Other abnormalities of gait and mobility (R26.89)     Time: 8488-8462 PT Time Calculation (min) (ACUTE ONLY): 26 min  Charges:    $Therapeutic Activity: 23-37 mins PT General Charges $$ ACUTE PT VISIT: 1 Visit                      Richerd Pinal, PT, DPT 07/07/24, 4:12 PM   Richerd CHRISTELLA Pinal 07/07/2024, 4:11 PM

## 2024-07-07 NOTE — TOC Progression Note (Signed)
 Transition of Care [facility]) - Progression Note    Patient Details  Name: Anthony Craig MRN: [id number] Date of Birth: 07-27-1928  Transition of Care [facility]) CM/SW Contact  Dalia GORMAN Fuse, RN Phone Number: 07/07/2024, 11:49 AM  Clinical Narrative:    TOC spoke with the nurse at [facility], they will admit the patient tomorrow. Maria to see if the facility transportation can pick him up and will let TOC know.   Expected Discharge Plan: Memory Care Barriers to Discharge: Other (must enter comment) (Awaiting ALF eval)               Expected Discharge Plan and Services     Post Acute Care Choice: Resumption of Svcs/PTA Provider Living arrangements for the past 2 months: Assisted Living Facility (Memory Care) Expected Discharge Date: 07/06/24                                     Social Drivers of Health (SDOH) Interventions SDOH Screenings   Food Insecurity: No Food Insecurity (07/03/2024)  Housing: Unknown (07/03/2024)  Transportation Needs: No Transportation Needs (07/03/2024)  Utilities: Not At Risk (07/03/2024)  Alcohol Screen: Low Risk  (12/11/2021)  Depression (PHQ2-9): Low Risk  (12/18/2021)  Financial Resource Strain: Low Risk  (12/11/2021)  Physical Activity: Sufficiently Active (12/11/2021)  Social Connections: Patient Unable To Answer (07/03/2024)  Stress: Stress Concern Present (12/11/2021)  Tobacco Use: Medium Risk (07/02/2024)    Readmission Risk Interventions     No data to display

## 2024-07-07 NOTE — Progress Notes (Signed)
 PROGRESS NOTE    Anthony Craig  FMW:[id number] DOB: August 09, 1928 DOA: 07/02/2024 PCP: [facility]  Chief Complaint  Patient presents with   Altered Mental Status    Hospital Course:  Hiroyuki Ozanich is a 89 year old male with hypertension, hyperlipidemia, diabetes, GERD, essential tremor, chronic pancreatitis, chronic hepatitis, prostate cancer, spinal stenosis, age-related cognitive decline who presented with altered mental status.  Patient initially presented from his facility where he was diagnosed with COVID-19 2 days prior.  Prior to his COVID diagnosis he has been experiencing generalized decline over the last many months.  Been largely bedbound and wheelchair-bound for the last couple of weeks.  Subjective: No acute events overnight.  Patient is pleasantly confused this morning.  Objective: Vitals:   07/06/24 1609 07/06/24 2113 07/07/24 0546 07/07/24 0804  BP: 133/69 (!) 159/66 (!) 186/106 (!) 184/78  Pulse: 65 64 66 61  Resp: 18 18 19 18   Temp: 98 F (36.7 C) 98.6 F (37 C) (!) 97.5 F (36.4 C) 97.6 F (36.4 C)  TempSrc:  Oral Oral Oral  SpO2: 99% 99% 100% 95%  Weight:      Height:       No intake or output data in the 24 hours ending 07/07/24 1509 Filed Weights   07/02/24 1346 07/02/24 2113  Weight: 61.6 kg 59.4 kg    Examination: General exam: Appears calm and comfortable, NAD  Respiratory system: No work of breathing, symmetric chest wall expansion Cardiovascular system: S1 & S2 heard, RRR.  Gastrointestinal system: Abdomen is nondistended, soft and nontender.  Neuro: Alert and oriented to self.  Assessment & Plan:  Principal Problem:   Acute encephalopathy Active Problems:   Type 2 diabetes mellitus with other circulatory complication, with long-term current use of insulin  (HCC)   Benign essential tremor   Acid reflux   HLD (hyperlipidemia)   BP (high blood pressure)   Chronic pancreatitis, unspecified pancreatitis type (HCC)    CA of prostate (HCC)   Chronic hepatitis (HCC)   Spinal stenosis, lumbar region, with neurogenic claudication   Acute metabolic encephalopathy superimposed on dementia Fever COVID-19 Sepsis - Sepsis criteria: Fever, tachycardia, encephalopathy, lactic acidosis - Metabolic encephalopathy in setting of COVID-19 - No leukocytosis, or superimposed bacterial pneumonia.  Patient briefly received remdesivir which is since been discontinued - Continue symptomatic support - No hypoxia or respiratory distress - Continue frequent reorientation and delirium precautions  Elevated blood pressure without diagnosis of hypertension - BP slowly rising throughout this admission.  Patient does not appear to be on any antihypertensives at home.  Lisinopril initiated today.  Follow and titrate as needed - Continue with as needed hydralazine and labetalol  General decline Weakness - Palliative care consulted - Patient resides in assisted living facility - Continue PT/OT  Hypokalemia Hypomagnesemia - Replace as needed   Hyperlipidemia CAD - CABG 2001 - Continue home meds  Diabetes, poorly controlled - Hemoglobin A1c 9.5% - Continue basal/bolus with sliding scale insulin , titrate as needed  GERD - Continue PPI  Essential tremor - Continue home meds  Chronic pancreatitis - Continue home meds  History of pancreatic cancer - Reportedly diagnosed 15 years prior  History of prostate cancer History of cataracts History of hepatitis Spinal stenosis - Stable.  Aware.  Acute urinary retention - Foley catheter inserted on 07-Jul-2024.  Has passed voiding trial today.  Troponin elevation - Troponin 428 then 345.  No chest pain.  Thought to be demand ischemia    DVT prophylaxis: lovenox  Code Status: Limited: Do not attempt resuscitation (DNR) -DNR-LIMITED -Do Not Intubate/DNI  Disposition:  Pending return to ALF. Medically ready for DC otherwise  Consultants:    Procedures:     Antimicrobials:  Anti-infectives (From admission, onward)    Start     Dose/Rate Route Frequency Ordered Stop   07/03/24 1000  remdesivir 100 mg in sodium chloride  0.9 % 100 mL IVPB  Status:  Discontinued       Placed in Followed by Linked Group   100 mg 200 mL/hr over 30 Minutes Intravenous Daily 07/02/24 1632 07/03/24 1425   07/02/24 1800  remdesivir 200 mg in sodium chloride  0.9% 250 mL IVPB       Placed in Followed by Linked Group   200 mg 580 mL/hr over 30 Minutes Intravenous Once 07/02/24 1632 07/02/24 1847       Data Reviewed: I have personally reviewed following labs and imaging studies CBC: Recent Labs  Lab 07/02/24 1400 07/03/24 0518  WBC 7.7 3.9*  NEUTROABS 6.9  --   HGB 13.0 11.0*  HCT 39.2 33.4*  MCV 80.7 81.1  PLT 176 146*   Basic Metabolic Panel: Recent Labs  Lab 07/02/24 1400 07/03/24 0518 07/04/24 0248 07/05/24 0414  NA 133* 137  --   --   K 2.9* 3.1* 3.3* 3.5  CL 92* 96*  --   --   CO2 31 29  --   --   GLUCOSE 221* 91  --   --   BUN 13 14  --   --   CREATININE 1.05 0.54*  --   --   CALCIUM  8.4* 8.2*  --   --   MG 1.5* 1.8  --   --    GFR: Estimated Creatinine Clearance: 47.4 mL/min (A) (by C-G formula based on SCr of 0.54 mg/dL (L)). Liver Function Tests: Recent Labs  Lab 07/02/24 1400 07/03/24 0518  AST 25 17  ALT 12 12  ALKPHOS 99 72  BILITOT 0.7 0.4  PROT 7.0 5.6*  ALBUMIN 3.6 2.8*   CBG: Recent Labs  Lab 07/06/24 1215 07/06/24 1610 07/06/24 2208 07/07/24 0807 07/07/24 1148  GLUCAP 269* 136* 275* 245* 292*    Recent Results (from the past 240 hours)  Blood Culture (routine x 2)     Status: None   Collection Time: 07/02/24  1:52 PM   Specimen: BLOOD  Result Value Ref Range Status   Specimen Description BLOOD BLOOD RIGHT FOREARM  Final   Special Requests   Final    BOTTLES DRAWN AEROBIC AND ANAEROBIC Blood Culture adequate volume   Culture   Final    NO GROWTH 5 DAYS Performed at [facility], [address] [place], [place] [address]    Report Status 07/07/2024 FINAL  Final  Blood Culture (routine x 2)     Status: None   Collection Time: 07/02/24  2:00 PM   Specimen: BLOOD RIGHT ARM  Result Value Ref Range Status   Specimen Description BLOOD RIGHT ARM  Final   Special Requests   Final    BOTTLES DRAWN AEROBIC AND ANAEROBIC Blood Culture adequate volume   Culture   Final    NO GROWTH 5 DAYS Performed at [facility], [address], [place], [place] [address]    Report Status 07/07/2024 FINAL  Final  Resp panel by RT-PCR (RSV, Flu A&B, Covid) Anterior Nasal Swab     Status: Abnormal   Collection Time: 07/02/24  6:48 PM  Specimen: Anterior Nasal Swab  Result Value Ref Range Status   SARS Coronavirus 2 by RT PCR POSITIVE (A) NEGATIVE Final    Comment: (NOTE) SARS-CoV-2 target nucleic acids are DETECTED.  The SARS-CoV-2 RNA is generally detectable in upper respiratory specimens during the acute phase of infection. Positive results are indicative of the presence of the identified virus, but do not rule out bacterial infection or co-infection with other pathogens not detected by the test. Clinical correlation with patient history and other diagnostic information is necessary to determine patient infection status. The expected result is Negative.  Fact Sheet for Patients: BloggerCourse.com  Fact Sheet for Healthcare Providers: SeriousBroker.it  This test is not yet approved or cleared by the [place]  FDA and  has been authorized for detection and/or diagnosis of SARS-CoV-2 by FDA under an Emergency Use Authorization (EUA).  This EUA will remain in effect (meaning this test can be used) for the duration of  the COVID-19 declaration under Section 564(b)(1) of the A ct, 21 U.S.C. section 360bbb-3(b)(1), unless the authorization is terminated or revoked sooner.     Influenza A by PCR NEGATIVE NEGATIVE Final    Influenza B by PCR NEGATIVE NEGATIVE Final    Comment: (NOTE) The Xpert Xpress SARS-CoV-2/FLU/RSV plus assay is intended as an aid in the diagnosis of influenza from Nasopharyngeal swab specimens and should not be used as a sole basis for treatment. Nasal washings and aspirates are unacceptable for Xpert Xpress SARS-CoV-2/FLU/RSV testing.  Fact Sheet for Patients: BloggerCourse.com  Fact Sheet for Healthcare Providers: SeriousBroker.it  This test is not yet approved or cleared by the [place]  FDA and has been authorized for detection and/or diagnosis of SARS-CoV-2 by FDA under an Emergency Use Authorization (EUA). This EUA will remain in effect (meaning this test can be used) for the duration of the COVID-19 declaration under Section 564(b)(1) of the Act, 21 U.S.C. section 360bbb-3(b)(1), unless the authorization is terminated or revoked.     Resp Syncytial Virus by PCR NEGATIVE NEGATIVE Final    Comment: (NOTE) Fact Sheet for Patients: BloggerCourse.com  Fact Sheet for Healthcare Providers: SeriousBroker.it  This test is not yet approved or cleared by the [place]  FDA and has been authorized for detection and/or diagnosis of SARS-CoV-2 by FDA under an Emergency Use Authorization (EUA). This EUA will remain in effect (meaning this test can be used) for the duration of the COVID-19 declaration under Section 564(b)(1) of the Act, 21 U.S.C. section 360bbb-3(b)(1), unless the authorization is terminated or revoked.  Performed at [facility], [address], [place], [place] [address]      Radiology Studies: No results found.  Scheduled Meds:  Chlorhexidine Gluconate Cloth  6 each Topical Daily   enoxaparin (LOVENOX) injection  40 mg Subcutaneous Q24H   insulin  aspart  0-9 Units Subcutaneous TID WC   insulin  aspart  3 Units Subcutaneous TID WC    lipase/protease/amylase  36,000 Units Oral TID WC   lisinopril  5 mg Oral Daily   pantoprazole   40 mg Oral Daily   primidone   100 mg Oral QHS   sertraline   100 mg Oral q morning   sodium chloride  flush  3 mL Intravenous Q12H   tamsulosin  0.4 mg Oral QPC breakfast   Continuous Infusions:   LOS: 4 days  MDM: Patient is high risk for one or more organ failure.  They necessitate ongoing hospitalization for continued IV therapies and subsequent lab monitoring. Total time spent interpreting labs and vitals, reviewing  the medical record, coordinating care amongst consultants and care team members, directly assessing and discussing care with the patient and/or family: 55 min Zakk Borgen, DO [facility]  To contact the attending physician between 7A-7P please use Epic Chat. To contact the covering physician during after hours 7P-7A, please review Amion.  07/07/2024, 3:09 PM   *This document has been created with the assistance of dictation software. Please excuse typographical errors. *

## 2024-07-08 DIAGNOSIS — G9340 Encephalopathy, unspecified: Secondary | ICD-10-CM | POA: Diagnosis not present

## 2024-07-08 LAB — GLUCOSE, CAPILLARY: Glucose-Capillary: 299 mg/dL — ABNORMAL HIGH (ref 70–99)

## 2024-07-08 MED ORDER — TAMSULOSIN HCL 0.4 MG PO CAPS: 0.4000 mg | ORAL_CAPSULE | Freq: Every day | ORAL | 0 refills | Status: AC

## 2024-07-08 MED ORDER — LISINOPRIL 5 MG PO TABS: 5.0000 mg | ORAL_TABLET | Freq: Every day | ORAL | 0 refills | Status: AC

## 2024-07-08 NOTE — Plan of Care (Signed)
  Problem: Coping: Goal: Ability to adjust to condition or change in health will improve Outcome: Progressing   Problem: Metabolic: Goal: Ability to maintain appropriate glucose levels will improve Outcome: Progressing   Problem: Tissue Perfusion: Goal: Adequacy of tissue perfusion will improve Outcome: Progressing   Problem: Elimination: Goal: Will not experience complications related to bowel motility Outcome: Progressing   Problem: Safety: Goal: Ability to remain free from injury will improve Outcome: Progressing   Problem: Pain Managment: Goal: General experience of comfort will improve and/or be controlled Outcome: Progressing

## 2024-07-08 NOTE — Progress Notes (Signed)
 Report called to [facility]. Spoke with Hadassah. States patient will be coming back to memory care. Report given. All questions answered and callback number given.

## 2024-07-08 NOTE — TOC Transition Note (Signed)
 Transition of Care [facility]) - Discharge Note   Patient Details  Name: Anthony Craig MRN: [id number] Date of Birth: 04/05/1928  [facility] CM/SW Contact:  Marinda Cooks, RN Phone Number: 07/08/2024, 10:41 AM   Clinical Narrative:    This CM updated by covering MD pt medically cleared to dc today and has active DC order . This CM spoke with Admission liaison  [facility](Memory Care Unit) & arranged for pt to dc back to facility.  DC transportation confirmed for pt with ALF  driver [facility] team updated . No additional DC needs requested by medical team or identified by CM at this time .     Final next level of care: Skilled Nursing Facility Barriers to Discharge: No Barriers Identified    Discharge Placement                Patient to be transferred to facility by: [facility] via the [facility] [place] [phone] Name of family member notified: (Son) Zachary Patient and family notified of of transfer: 07/08/24  Discharge Plan and Services Additional resources added to the After Visit Summary for       Post Acute Care Choice: Resumption of Svcs/PTA Provider                l Drivers of Health (SDOH) Interventions SDOH Screenings   Food Insecurity: No Food Insecurity (07/03/2024)  Housing: Unknown (07/03/2024)  Transportation Needs: No Transportation Needs (07/03/2024)  Utilities: Not At Risk (07/03/2024)  Alcohol Screen: Low Risk  (12/11/2021)  Depression (PHQ2-9): Low Risk  (12/18/2021)  Financial Resource Strain: Low Risk  (12/11/2021)  Physical Activity: Sufficiently Active (12/11/2021)  Social Connections: Patient Unable To Answer (07/03/2024)  Stress: Stress Concern Present (12/11/2021)  Tobacco Use: Medium Risk (07/02/2024)     Readmission Risk Interventions     No data to display

## 2024-07-08 NOTE — Discharge Summary (Addendum)
 Addendum: Was notified after hours that patient needed medication sent to new pharmacy.  [facility]ed [facility] on 10/4 and confirmed receipt of new medications.   DISCHARGE SUMMARY    [facility] FMW:[id number] DOB: Nov 13, 1927 DOA: 07/02/2024  PCP: [facility]  Admit date: 07/02/2024 Discharge date: 07/08/2024   Recommendations for Outpatient Follow-up:  Follow up with PCP in 1-2 weeks to review blood pressure log and make further medication changes  Hospital Course: [facility] is a 89 year old male with hypertension, hyperlipidemia, diabetes, GERD, essential tremor, chronic pancreatitis, chronic hepatitis, prostate cancer, spinal stenosis, age-related cognitive decline who presented with altered mental status.  Patient initially presented from his facility where he was diagnosed with COVID-19 2 days prior.  Prior to his COVID diagnosis he has been experiencing generalized decline over the last many months he has been largely bedbound and wheelchair-bound for the last couple of weeks.  Patient had gradual return to his baseline mental status with intermittent delirium.  He was then medically clear for discharge as of 10/1 but DC was delayed pending return to ALF.  Acute metabolic encephalopathy superimposed on dementia Fever COVID-19 Sepsis - Sepsis criteria: Fever, tachycardia, encephalopathy, lactic acidosis - Metabolic encephalopathy in setting of COVID-19 and sepsis - No leukocytosis, or superimposed bacterial pneumonia.  Patient briefly received remdesivir which is since been discontinued - Continue symptomatic support - No hypoxia or respiratory distress - Continue frequent reorientation and delirium precautions   Elevated blood pressure without diagnosis of hypertension - BP slowly rising throughout this admission.  Patient does not appear to be on any antihypertensives at home - Has been initiated on lisinopril, will need to  follow-up closely with PCP for further titration   General decline Weakness - Palliative care consulted - Patient resides in assisted living facility - Continue PT/OT   Hypokalemia Hypomagnesemia - Replace as needed    Hyperlipidemia CAD - CABG 2001 - Continue home meds   Diabetes, poorly controlled - Hemoglobin A1c 9.5% - Continue basal/bolus with sliding scale insulin , titrate as needed   GERD - Continue PPI   Essential tremor - Continue home meds   Chronic pancreatitis - Continue home meds   History of pancreatic cancer - Reportedly diagnosed 15 years prior   History of prostate cancer History of cataracts History of hepatitis Spinal stenosis - Stable.  Aware.   Acute urinary retention - Foley catheter inserted on 9/28.  Subsequently passed voiding trial. - Has been initiated on Flomax, continue.   Troponin elevation - Troponin 428 then 345.  No chest pain.  Thought to be demand ischemia  Discharge Instructions  Discharge Instructions     Call MD for:  difficulty breathing, headache or visual disturbances   Complete by: As directed    Call MD for:  persistant dizziness or light-headedness   Complete by: As directed    Call MD for:  persistant nausea and vomiting   Complete by: As directed    Call MD for:  severe uncontrolled pain   Complete by: As directed    Call MD for:  temperature >100.4   Complete by: As directed    Diet general   Complete by: As directed    Discharge instructions   Complete by: As directed    Follow up with your primary care physician to discuss the medication changes during this admission   Increase activity slowly   Complete by: As directed       Allergies as of 07/08/2024  Reactions   Doxycycline Other (See Comments)   Reaction: per [facility] patient called on 11/30/14 stating he was having a reaction to a medication possibly including redness, swelling and itching, he was not sure at that time if it was this  medication or Hydrocodone -homatropin.   Hydrocodone  Bit-homatrop Mbr Other (See Comments)   Reaction:  Reaction: per [facility] patient called on 11/30/14 stating he was having a reaction to a medication possibly including redness, swelling and itching, he was not sure at that time if it was this medication or Doxy that he was taking at the same time. Patient has been taking Hydrocodone  with no problems.        Medication List     STOP taking these medications    traZODone 50 MG tablet Commonly known as: DESYREL       TAKE these medications    Acetaminophen  Extra Strength 500 MG Tabs Take 1 tablet by mouth every 8 (eight) hours.   B-D SINGLE USE SWABS REGULAR Pads USE AS DIRECTED  WITH  EACH  FINGERSTICK   HumaLOG KwikPen 100 UNIT/ML KwikPen Generic drug: insulin  lispro Inject 5 Units into the skin 3 (three) times daily before meals.   Insulin  Glargine Solostar 100 UNIT/ML Solostar Pen Commonly known as: LANTUS Inject 9 Units into the skin 2 (two) times daily.   lipase/protease/amylase 63999 UNITS Cpep capsule Commonly known as: CREON  Take 36,000 Units by mouth 3 (three) times daily with meals.   lisinopril 5 MG tablet Commonly known as: ZESTRIL Take 1 tablet (5 mg total) by mouth daily. Start taking on: July 09, 2024   Mucinex 600 MG 12 hr tablet Generic drug: guaiFENesin Take 600 mg by mouth 2 (two) times daily.   omeprazole  20 MG capsule Commonly known as: PRILOSEC Take 20 mg by mouth daily.   primidone  50 MG tablet Commonly known as: MYSOLINE  Take 50 mg by mouth at bedtime.   sertraline  100 MG tablet Commonly known as: ZOLOFT  Take 100 mg by mouth every morning.   tamsulosin 0.4 MG Caps capsule Commonly known as: FLOMAX Take 1 capsule (0.4 mg total) by mouth daily after breakfast. Start taking on: July 09, 2024   ursodiol 300 MG capsule Commonly known as: ACTIGALL Take 300 mg by mouth 2 (two) times daily.   YumVs Glucose Gummies 2 g  Chew Generic drug: Glucose Chew 6 g by mouth every 8 (eight) hours as needed.        Follow-up Information     [facility].   Specialty: General Practice Why: hospital follow up Contact information: [address] [place] [address] [phone]                Allergies  Allergen Reactions   Doxycycline Other (See Comments)    Reaction: per [facility] patient called on 11/30/14 stating he was having a reaction to a medication possibly including redness, swelling and itching, he was not sure at that time if it was this medication or Hydrocodone -homatropin.    Hydrocodone  Bit-Homatrop Mbr Other (See Comments)    Reaction:  Reaction: per [facility] patient called on 11/30/14 stating he was having a reaction to a medication possibly including redness, swelling and itching, he was not sure at that time if it was this medication or Doxy that he was taking at the same time. Patient has been taking Hydrocodone  with no problems.    Consultations:    Procedures/Studies: DG Chest Port 1 View Result Date: 07/02/2024 EXAM: 1  VIEW(S) XRAY OF THE CHEST 07/02/2024 01:58:00 PM COMPARISON: 08/30/2023 CLINICAL HISTORY: Questionable sepsis - evaluate for abnormality. FINDINGS: LUNGS AND PLEURA: Azygous lobe. Lungs are hypoinflated. No focal pulmonary opacity. No pulmonary edema. No pleural effusion. No pneumothorax. HEART AND MEDIASTINUM: Sternotomy wires noted. Aortic atherosclerosis. CABG markers noted. No acute abnormality of the cardiac and mediastinal silhouettes. BONES AND SOFT TISSUES: No acute osseous abnormality. IMPRESSION: 1. No acute cardiopulmonary process. Electronically signed by: Waddell Calk MD 07/02/2024 02:09 PM EDT RP Workstation: [id number]      Discharge Exam: Vitals:   07/08/24 0423 07/08/24 0819  BP: (!) 146/77 (!) 156/88  Pulse: 71 75  Resp: 18 19  Temp: 98.3 F (36.8 C) 97.9 F (36.6 C)  SpO2: 95% 96%   Vitals:   07/07/24 2019 07/07/24  2030 07/08/24 0423 07/08/24 0819  BP: (!) 172/75 (!) 149/65 (!) 146/77 (!) 156/88  Pulse: 66 68 71 75  Resp: 16  18 19   Temp: 98.1 F (36.7 C)  98.3 F (36.8 C) 97.9 F (36.6 C)  TempSrc: Oral     SpO2: 100% 99% 95% 96%  Weight:      Height:        Constitutional:  Normal appearance. Non toxic-appearing.  HENT: Head Normocephalic and atraumatic.  Mucous membranes are moist.  Eyes:  Extraocular intact. Conjunctivae normal.  Cardiovascular: Rate and Rhythm: Normal rate and regular rhythm.  Pulmonary: Non labored, symmetric rise of chest wall.  Neurological: No focal deficit present. alert. Oriented, speech intermittently incoherent  Psychiatric: Mood and Affect congruent.    The results of significant diagnostics from this hospitalization (including imaging, microbiology, ancillary and laboratory) are listed below for reference.     Microbiology: Recent Results (from the past 240 hours)  Blood Culture (routine x 2)     Status: None   Collection Time: 07/02/24  1:52 PM   Specimen: BLOOD  Result Value Ref Range Status   Specimen Description BLOOD BLOOD RIGHT FOREARM  Final   Special Requests   Final    BOTTLES DRAWN AEROBIC AND ANAEROBIC Blood Culture adequate volume   Culture   Final    NO GROWTH 5 DAYS Performed at [facility], [address] [place], [place] [address]    Report Status 07/07/2024 FINAL  Final  Blood Culture (routine x 2)     Status: None   Collection Time: 07/02/24  2:00 PM   Specimen: BLOOD RIGHT ARM  Result Value Ref Range Status   Specimen Description BLOOD RIGHT ARM  Final   Special Requests   Final    BOTTLES DRAWN AEROBIC AND ANAEROBIC Blood Culture adequate volume   Culture   Final    NO GROWTH 5 DAYS Performed at [facility], [address] [place], [place] [address]    Report Status 07/07/2024 FINAL  Final  Resp panel by RT-PCR (RSV, Flu A&B, Covid) Anterior Nasal Swab     Status: Abnormal   Collection Time: 07/02/24   6:48 PM   Specimen: Anterior Nasal Swab  Result Value Ref Range Status   SARS Coronavirus 2 by RT PCR POSITIVE (A) NEGATIVE Final    Comment: (NOTE) SARS-CoV-2 target nucleic acids are DETECTED.  The SARS-CoV-2 RNA is generally detectable in upper respiratory specimens during the acute phase of infection. Positive results are indicative of the presence of the identified virus, but do not rule out bacterial infection or co-infection with other pathogens not detected by the test. Clinical correlation with patient history and  other diagnostic information is necessary to determine patient infection status. The expected result is Negative.  Fact Sheet for Patients: BloggerCourse.com  Fact Sheet for Healthcare Providers: SeriousBroker.it  This test is not yet approved or cleared by the [place]  FDA and  has been authorized for detection and/or diagnosis of SARS-CoV-2 by FDA under an Emergency Use Authorization (EUA).  This EUA will remain in effect (meaning this test can be used) for the duration of  the COVID-19 declaration under Section 564(b)(1) of the A ct, 21 U.S.C. section 360bbb-3(b)(1), unless the authorization is terminated or revoked sooner.     Influenza A by PCR NEGATIVE NEGATIVE Final   Influenza B by PCR NEGATIVE NEGATIVE Final    Comment: (NOTE) The Xpert Xpress SARS-CoV-2/FLU/RSV plus assay is intended as an aid in the diagnosis of influenza from Nasopharyngeal swab specimens and should not be used as a sole basis for treatment. Nasal washings and aspirates are unacceptable for Xpert Xpress SARS-CoV-2/FLU/RSV testing.  Fact Sheet for Patients: BloggerCourse.com  Fact Sheet for Healthcare Providers: SeriousBroker.it  This test is not yet approved or cleared by the [place]  FDA and has been authorized for detection and/or diagnosis of SARS-CoV-2 by FDA  under an Emergency Use Authorization (EUA). This EUA will remain in effect (meaning this test can be used) for the duration of the COVID-19 declaration under Section 564(b)(1) of the Act, 21 U.S.C. section 360bbb-3(b)(1), unless the authorization is terminated or revoked.     Resp Syncytial Virus by PCR NEGATIVE NEGATIVE Final    Comment: (NOTE) Fact Sheet for Patients: BloggerCourse.com  Fact Sheet for Healthcare Providers: SeriousBroker.it  This test is not yet approved or cleared by the [place]  FDA and has been authorized for detection and/or diagnosis of SARS-CoV-2 by FDA under an Emergency Use Authorization (EUA). This EUA will remain in effect (meaning this test can be used) for the duration of the COVID-19 declaration under Section 564(b)(1) of the Act, 21 U.S.C. section 360bbb-3(b)(1), unless the authorization is terminated or revoked.  Performed at [facility], [address] [place], [place] [address]      Labs: BNP (last 3 results) Recent Labs    08/30/23 1633  BNP 34.5   Basic Metabolic Panel: Recent Labs  Lab 07/02/24 1400 07/03/24 0518 07/04/24 0248 07/05/24 0414  NA 133* 137  --   --   K 2.9* 3.1* 3.3* 3.5  CL 92* 96*  --   --   CO2 31 29  --   --   GLUCOSE 221* 91  --   --   BUN 13 14  --   --   CREATININE 1.05 0.54*  --   --   CALCIUM  8.4* 8.2*  --   --   MG 1.5* 1.8  --   --    Liver Function Tests: Recent Labs  Lab 07/02/24 1400 07/03/24 0518  AST 25 17  ALT 12 12  ALKPHOS 99 72  BILITOT 0.7 0.4  PROT 7.0 5.6*  ALBUMIN 3.6 2.8*   No results for input(s): LIPASE, AMYLASE in the last 168 hours. No results for input(s): AMMONIA in the last 168 hours. CBC: Recent Labs  Lab 07/02/24 1400 07/03/24 0518  WBC 7.7 3.9*  NEUTROABS 6.9  --   HGB 13.0 11.0*  HCT 39.2 33.4*  MCV 80.7 81.1  PLT 176 146*   Cardiac Enzymes: No results for input(s): CKTOTAL,  CKMB, CKMBINDEX, TROPONINI in the last 168 hours. BNP: Invalid input(s): POCBNP  CBG: Recent Labs  Lab 07/07/24 0807 07/07/24 1148 07/07/24 1607 07/07/24 2024 07/08/24 0820  GLUCAP 245* 292* 225* 194* 299*   D-Dimer No results for input(s): DDIMER in the last 72 hours. Hgb A1c No results for input(s): HGBA1C in the last 72 hours. Lipid Profile No results for input(s): CHOL, HDL, LDLCALC, TRIG, CHOLHDL, LDLDIRECT in the last 72 hours. Thyroid  function studies No results for input(s): TSH, T4TOTAL, T3FREE, THYROIDAB in the last 72 hours.  Invalid input(s): FREET3 Anemia work up No results for input(s): VITAMINB12, FOLATE, FERRITIN, TIBC, IRON, RETICCTPCT in the last 72 hours. Urinalysis    Component Value Date/Time   COLORURINE YELLOW (A) 07/03/2024 0600   APPEARANCEUR CLEAR (A) 07/03/2024 0600   APPEARANCEUR Clear 01/16/2014 0231   LABSPEC 1.027 07/03/2024 0600   LABSPEC 1.009 01/16/2014 0231   PHURINE 5.0 07/03/2024 0600   GLUCOSEU 150 (A) 07/03/2024 0600   GLUCOSEU Negative 01/16/2014 0231   HGBUR NEGATIVE 07/03/2024 0600   BILIRUBINUR NEGATIVE 07/03/2024 0600   BILIRUBINUR Negative 06/08/2019 1557   BILIRUBINUR Negative 01/16/2014 0231   KETONESUR NEGATIVE 07/03/2024 0600   PROTEINUR 30 (A) 07/03/2024 0600   UROBILINOGEN 0.2 06/08/2019 1557   NITRITE NEGATIVE 07/03/2024 0600   LEUKOCYTESUR NEGATIVE 07/03/2024 0600   LEUKOCYTESUR Negative 01/16/2014 0231   Sepsis Labs Recent Labs  Lab 07/02/24 1400 07/03/24 0518  WBC 7.7 3.9*   Microbiology Recent Results (from the past 240 hours)  Blood Culture (routine x 2)     Status: None   Collection Time: 07/02/24  1:52 PM   Specimen: BLOOD  Result Value Ref Range Status   Specimen Description BLOOD BLOOD RIGHT FOREARM  Final   Special Requests   Final    BOTTLES DRAWN AEROBIC AND ANAEROBIC Blood Culture adequate volume   Culture   Final    NO GROWTH 5 DAYS Performed  at [facility], [address] [place], [place] [address]    Report Status 07/07/2024 FINAL  Final  Blood Culture (routine x 2)     Status: None   Collection Time: 07/02/24  2:00 PM   Specimen: BLOOD RIGHT ARM  Result Value Ref Range Status   Specimen Description BLOOD RIGHT ARM  Final   Special Requests   Final    BOTTLES DRAWN AEROBIC AND ANAEROBIC Blood Culture adequate volume   Culture   Final    NO GROWTH 5 DAYS Performed at [facility], [address] [place], [place] [address]    Report Status 07/07/2024 FINAL  Final  Resp panel by RT-PCR (RSV, Flu A&B, Covid) Anterior Nasal Swab     Status: Abnormal   Collection Time: 07/02/24  6:48 PM   Specimen: Anterior Nasal Swab  Result Value Ref Range Status   SARS Coronavirus 2 by RT PCR POSITIVE (A) NEGATIVE Final    Comment: (NOTE) SARS-CoV-2 target nucleic acids are DETECTED.  The SARS-CoV-2 RNA is generally detectable in upper respiratory specimens during the acute phase of infection. Positive results are indicative of the presence of the identified virus, but do not rule out bacterial infection or co-infection with other pathogens not detected by the test. Clinical correlation with patient history and other diagnostic information is necessary to determine patient infection status. The expected result is Negative.  Fact Sheet for Patients: BloggerCourse.com  Fact Sheet for Healthcare Providers: SeriousBroker.it  This test is not yet approved or cleared by the [place]  FDA and  has been authorized for detection and/or diagnosis of SARS-CoV-2  by FDA under an Emergency Use Authorization (EUA).  This EUA will remain in effect (meaning this test can be used) for the duration of  the COVID-19 declaration under Section 564(b)(1) of the A ct, 21 U.S.C. section 360bbb-3(b)(1), unless the authorization is terminated or revoked sooner.     Influenza A  by PCR NEGATIVE NEGATIVE Final   Influenza B by PCR NEGATIVE NEGATIVE Final    Comment: (NOTE) The Xpert Xpress SARS-CoV-2/FLU/RSV plus assay is intended as an aid in the diagnosis of influenza from Nasopharyngeal swab specimens and should not be used as a sole basis for treatment. Nasal washings and aspirates are unacceptable for Xpert Xpress SARS-CoV-2/FLU/RSV testing.  Fact Sheet for Patients: BloggerCourse.com  Fact Sheet for Healthcare Providers: SeriousBroker.it  This test is not yet approved or cleared by the [place]  FDA and has been authorized for detection and/or diagnosis of SARS-CoV-2 by FDA under an Emergency Use Authorization (EUA). This EUA will remain in effect (meaning this test can be used) for the duration of the COVID-19 declaration under Section 564(b)(1) of the Act, 21 U.S.C. section 360bbb-3(b)(1), unless the authorization is terminated or revoked.     Resp Syncytial Virus by PCR NEGATIVE NEGATIVE Final    Comment: (NOTE) Fact Sheet for Patients: BloggerCourse.com  Fact Sheet for Healthcare Providers: SeriousBroker.it  This test is not yet approved or cleared by the [place]  FDA and has been authorized for detection and/or diagnosis of SARS-CoV-2 by FDA under an Emergency Use Authorization (EUA). This EUA will remain in effect (meaning this test can be used) for the duration of the COVID-19 declaration under Section 564(b)(1) of the Act, 21 U.S.C. section 360bbb-3(b)(1), unless the authorization is terminated or revoked.  Performed at [facility], [address], [place], [place] [address]      Time coordinating discharge: 32 min   SIGNED: Lorane Poland, DO [facility] 07/08/2024, 10:36 AM Pager   If 7PM-7AM, please contact night-coverage

## 2024-09-01 ENCOUNTER — Other Ambulatory Visit: Payer: Self-pay

## 2024-09-01 ENCOUNTER — Emergency Department

## 2024-09-01 ENCOUNTER — Emergency Department: Admission: EM | Admit: 2024-09-01 | Discharge: 2024-09-01 | Disposition: A | Discharge status: Skilled Nursing Facility

## 2024-09-01 DIAGNOSIS — I10 Essential (primary) hypertension: Secondary | ICD-10-CM | POA: Insufficient documentation

## 2024-09-01 DIAGNOSIS — Z8546 Personal history of malignant neoplasm of prostate: Secondary | ICD-10-CM | POA: Insufficient documentation

## 2024-09-01 DIAGNOSIS — S72141A Displaced intertrochanteric fracture of right femur, initial encounter for closed fracture: Secondary | ICD-10-CM | POA: Insufficient documentation

## 2024-09-01 DIAGNOSIS — E119 Type 2 diabetes mellitus without complications: Secondary | ICD-10-CM | POA: Insufficient documentation

## 2024-09-01 DIAGNOSIS — W050XXA Fall from non-moving wheelchair, initial encounter: Secondary | ICD-10-CM | POA: Insufficient documentation

## 2024-09-01 DIAGNOSIS — S72001A Fracture of unspecified part of neck of right femur, initial encounter for closed fracture: Secondary | ICD-10-CM

## 2024-09-01 DIAGNOSIS — W19XXXA Unspecified fall, initial encounter: Secondary | ICD-10-CM

## 2024-09-01 MED ORDER — OXYCODONE-ACETAMINOPHEN 5-325 MG PO TABS: 1.0000 | ORAL_TABLET | ORAL | 0 refills | Status: DC | PRN

## 2024-09-01 MED ORDER — OXYCODONE-ACETAMINOPHEN 5-325 MG PO TABS
1.0000 | ORAL_TABLET | Freq: Once | ORAL | Status: AC
  Administered 2024-09-01: 1 via ORAL
  Filled 2024-09-01: qty 1

## 2024-09-01 MED ORDER — OXYCODONE-ACETAMINOPHEN 5-325 MG PO TABS: 1.0000 | ORAL_TABLET | ORAL | 0 refills | Status: AC | PRN

## 2024-09-01 NOTE — ED Triage Notes (Signed)
 Pt coming from [facility] via EMS. EMS reports pt tripped and had witnessed fall, but unknown if pt hit head. C/o right sided hip pain.

## 2024-09-01 NOTE — Discharge Instructions (Addendum)
 You were seen in our emergency department after a fall from wheelchair.  Your case was discussed extensively with your stepson and also orthopedics and your stepson has decided not to pursue any operative intervention.  You may have increased pain requirements with inferior right hip and may require further pain medication.  Please coordinate with your hospice team.  Your stepson/POA does want it written that you should not be placed in a wheelchair as you are at increased falls but rather will be in bed or in the lounger that he has purchased.  Please return with any acutely worsening symptoms or any other emergency. -- - RETURN PRECAUTIONS & AFTERCARE: (ENGLISH) RETURN PRECAUTIONS: Return immediately to the emergency department or see/call your doctor if you feel worse, weak or have changes in speech or vision, are short of breath, have fever, vomiting, pain, bleeding or dark stool, trouble urinating or any new issues. Return here or see/call your doctor if not improving as expected for your suspected condition. FOLLOW-UP CARE: Call your doctor and/or any doctors we referred you to for more advice and to make an appointment. Do this today, tomorrow or after the weekend. Some doctors only take PPO insurance so if you have HMO insurance you may want to contact your HMO or your regular doctor for referral to a specialist within your plan. Either way tell the doctor's office that it was a referral from the emergency department so you get the soonest possible appointment.  YOUR TEST RESULTS: Take result reports of any blood or urine tests, imaging tests and EKG's to your doctor and any referral doctor. Have any abnormal tests repeated. Your doctor or a referral doctor can let you know when this should be done. Also make sure your doctor contacts this hospital to get any test results that are not currently available such as cultures or special tests for infection and final imaging reports, which are often not  available at the time you leave the ER but which may list additional important findings that are not documented on the preliminary report. BLOOD PRESSURE: If your blood pressure was greater than 120/80 have your blood pressure rechecked within 1 to 2 weeks. MEDICATION SIDE EFFECTS: Do not drive, walk, bike, take the bus, etc. if you have received or are being prescribed any sedating medications such as those for pain or anxiety or certain antihistamines like Benadryl . If you have been give one of these here get a taxi home or have a friend drive you home. Ask your pharmacist to counsel you on potential side effects of any new medication

## 2024-09-01 NOTE — ED Notes (Signed)
 Called [facility] and spoke with Narlyn secretary. She stated they had already received call from [facility] MD regarding pt and would call me if they needed any more info.

## 2024-09-01 NOTE — ED Notes (Addendum)
 Called [facility] at [phone] [facility]) and spoke with [place], Engineer, Materials. This RN gave report on pt and asked about what they would prefer regarding transportation for pt back to facility. [facility], Triage RN stated she would have the pt facility RN give me a call shortly.

## 2024-09-01 NOTE — ED Notes (Signed)
 [facility] called at !:14 per [place] it 3 patients ahead of him

## 2024-09-01 NOTE — Progress Notes (Signed)
 [facility] Liaison Note  Current hospice patient presented to the [facility] ED due to a fall at the facility. He sustained a right intertrochanteric femur fracture.    HL spoke with patient's son, Zachary Donna, today.  He reported that patient would not want to go through surgery.  He stated that he would like for patient to return to [facility] today so he can be with his spouse, who is also a resident at [facility]. Son reported that he did not want to have patient placed at the [facility] at this time.    [facility] agreed to allow patient to return this afternoon.  MD sent prescriptions to pharmacy used by [facility].  [facility] called by ED staff at 1:15pm to transport patient back to facility.  Hospice team, patient's son notified.    [facility] Liasion [phone]

## 2024-09-01 NOTE — ED Triage Notes (Signed)
 Pt coming from [facility] (dementia unit) via EMS. EMS reports pt tripped and had witnessed fall, but unknown if pt hit head. C/o right sided hip pain.

## 2024-09-01 NOTE — ED Provider Notes (Signed)
 Anthony Craig Provider Note    Event Date/Time   First MD Initiated Contact with Patient 09/01/24 [phone]     (approximate)   History   Fall   HPI  Anthony Craig is a 89 y.o. male male with hypertension, hyperlipidemia, diabetes, GERD, essential tremor, chronic pancreatitis, chronic hepatitis, prostate cancer, spinal stenosis, age-related cognitive decline on hospice for cognitive decline who presents from his memory care unit via EMS after patient tripped and had a witnessed fall.  Patient is endorsing right hip pain.  He does ambulate at baseline (but is a fall risk).  It does not appear that he is on any anticoagulation.  He was hemodynamically stable and route.  His POA is his stepson who was called after patient arrival: The stepsons main concern is making sure that the patient is comfortable.  He is agreeable to CT imaging of the head and x-ray imaging of the hip and holding off blood work for now.  Will plan to rediscuss after these results  (Of note it was later clarified by the patient's stepson that patient is nonambulatory at baseline and the patient fell from her wheelchair today)      Physical Exam   Triage Vital Signs: ED Triage Vitals  Encounter Vitals Group     BP      Girls Systolic BP Percentile      Girls Diastolic BP Percentile      Boys Systolic BP Percentile      Boys Diastolic BP Percentile      Pulse      Resp      Temp      Temp src      SpO2      Weight      Height      Head Circumference      Peak Flow      Pain Score      Pain Loc      Pain Education      Exclude from Growth Chart     Most recent vital signs: Vitals:   09/01/24 0933 09/01/24 0935  BP:  (!) 163/83  Pulse: 66   Resp: 20   Temp: 97.8 F (36.6 C)   SpO2: 97%     Nursing Triage Note reviewed. Vital signs reviewed and patients oxygen saturation is normoxic  General: Patient is well nourished, well developed, awake and alert, resting  comfortably in no acute distress Head: Normocephalic, there is no scalp lesions or abrasions Eyes: Normal inspection, extraocular muscles intact, no conjunctival pallor Ear, nose, throat: Normal external exam Neck: Normal range of motion, no C-spine tenderness to palpation Respiratory: Patient is in no respiratory distress, lungs CTAB Cardiovascular: Patient is not tachycardic, RR GI: Abd SNT with no guarding or rebound  Back: Normal inspection of the back with good strength and range of motion throughout all ext No T-spine tenderness to palpation Extremities: pulses intact with good cap refills, no LE pitting edema or calf tenderness Patient is tender over his right proximal femur but I do not appreciate any ecchymosis or open lacerations Neuro: The patient is alert and oriented to person, not to place or time and very confused with 5/5 bilat UE/LE strength, no gross motor or sensory defects noted.    ED Results / Procedures / Treatments   Labs (all labs ordered are listed, but only abnormal results are displayed) Labs Reviewed - No data to display   EKG None  RADIOLOGY X-ray of  right femur: Intertrochanteric fracture on my independent review interpretation radiologist agrees X-ray pelvis no acute abnormality except for femur CT head: No intracranial hemorrhage on my independent review interpretation radiologist CT C-spine: No acute abnormality    PROCEDURES:  Critical Care performed: No  Procedures   MEDICATIONS ORDERED IN ED: Medications  oxyCODONE -acetaminophen  (PERCOCET/ROXICET) 5-325 MG per tablet 1 tablet (1 tablet Oral Given 09/01/24 1215)     IMPRESSION / MDM / ASSESSMENT AND PLAN / ED COURSE                                Differential diagnosis includes, but is not limited to, femur fracture, pelvic fracture, hip contusion, intracranial hemorrhage, spinal fracture   ED course: Difficult situation as patient is a hospice patient secondary to increased  cognitive decline and did have a mechanical fall today.  Will need to have careful discussions with patient's POA determine course of care.  CT imaging ordered of the head with POA's permission.  X-ray of the femur is concerning for an intro trochanteric fracture.  Will discuss goals of care with the patient's POA to determine whether orthopedics should be involved   Clinical Course as of 09/01/24 1346  Thu Sep 01, 2024  9061 Will call Zachary Donna (step son) [phone] [HD]  [phone] Discussed case with stepson who is his POA.  Everyone is amenable to obtaining imaging to figure out next steps.  Mr. 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  [HD]  1015 Patient resting comfortably [HD]  1038 Attempted to call family with no pickup left voice message [HD]  1040 CT Head Wo Contrast No intracranial hemorrhage [HD]  1040 CT Cervical Spine Wo Contrast No acute abnormality [HD]  1051 I had a long discussion with the patient's stepson and patient is not ambulatory at baseline this is an contraindication to what we were told by EMS.  Patient fell out of her wheelchair today.  The patient's son would not want any surgery given his nonambulatory status.  Will touch base with orthopedics to make sure there is nothing else to offer but will also touch base with the hospice team [HD]  1109 Case discussed with orthopedic on-call Dr. Gust who will talk to grandson with all options [HD]  1137 [name] is currently thinking about what is the right decision to do.  Orthopedics has talked with the stepson, and has reviewed the indications benefits risks alternatives [HD]  1158 I rediscussed the case with the patient's POA stepson [name] and we reviewed everything including his discussion with Dr. Gust, and at this time, he does not want any operative intervention or  admission to the hospital.  He does want the patient to return home and the patient to be bedbound.  I will write this in the discharge instructions.  At this time we are having difficulty getting a hold of the hospice team but Zachary was able to give me the number of [facility] [phone] who I will try to call and touch base [HD]  1200 Left message for [facility] but unable to get a response [HD]  1345 We were able to get a hold of hospice and they did request that I send some bridging medication to [facility] pharmacy in [place].  Patient to go home with comfort care with hospice and back to his facility.  stepson in absolute agreement [HD]    Clinical Course User Index [HD] Nicholaus Rolland BRAVO, MD   -- Risk: 5 This patient has a high risk of morbidity due to further diagnostic testing or treatment. Rationale: This patient's evaluation and management involve a high risk of morbidity due to the potential severity of presenting symptoms, need for diagnostic testing, and/or initiation of treatment that may require close monitoring. The differential includes conditions with potential for significant deterioration or requiring escalation of care. Treatment decisions in the ED, including medication administration, procedural interventions, or disposition planning, reflect this level of risk. COPA: 5 The patient has the following acute or chronic illness/injury that poses a possible threat to life or bodily function: [X] : The patient has a potentially serious acute condition or an acute exacerbation of a chronic illness requiring urgent evaluation and management in the Emergency Department. The clinical presentation necessitates immediate consideration of life-threatening or function-threatening diagnoses, even if they are ultimately ruled out.   FINAL CLINICAL IMPRESSION(S) / ED DIAGNOSES   Final diagnoses:  Fall, initial encounter  Closed fracture of right hip, initial encounter (HCC)     Rx / DC Orders    ED Discharge Orders          Ordered    oxyCODONE -acetaminophen  (PERCOCET) 5-325 MG tablet  Every 4 hours PRN,   Status:  Discontinued        09/01/24 1250    oxyCODONE -acetaminophen  (PERCOCET) 5-325 MG tablet  Every 4 hours PRN        09/01/24 1259             Note:  This document was prepared using Dragon voice recognition software and may include unintentional dictation errors.   Nicholaus Rolland BRAVO, MD 09/01/24 1346

## 2024-09-01 NOTE — Progress Notes (Addendum)
 I was consulted by the Emergency Department this morning regarding patient.  Radiographs were reviewed demonstrating a right intertrochanteric femur fracture.  I proceeded to call patient's step son-in-law, Zachary, who is the power of attorney for the patient to discuss how he would like to proceed with treatment.  He states that his father has been living at a memory care facility due to underlying dementia.  He is an active hospice patient with a DNR order.  Zachary reports that patient has been nonambulatory for a number of weeks.  We had a long and detailed discussion regarding consideration of surgical intervention strictly for palliative pain measures.  It could potentially aid in pain control especially for transfers.  I notified patient that without surgery, the likely palliative pain measure would be medication in an attempt to get patient comfortable.  A surgical decision would have to be weighed against the significant risks of undergoing surgery given patient's fragile state.  At this time, patient's POA would like to consider his options and will notify the care team of his ultimate decision.  I discussed all possible treatment options with the legal caregiver including conservative measures as well as surgical intervention.    We discussed the risks of surgical intervention including but not limited to infection, bleeding, DVT/ PE, damage to surrounding structures, need for further surgery, failure of hardware, persistent pain, persistent swelling, complications from anesthesia, stroke, seizure, heart attack, permanent disability, loss of extremity, or even death.  We discussed the benefits of surgical intervention including relief of pain.  After discussing all possible risks and benefits specific to the legal caregiver and the surgery, the legal caregiver voiced good understanding of their options moving forward in what each would entail.    He will think about these options and notify our  team if he would like to proceed with surgery.  Please call orthopedic service if POA would like to proceed with surgical intervention.

## 2024-10-06 DEATH — deceased
